# Patient Record
Sex: Female | Born: 1950 | Race: White | Hispanic: No | Marital: Married | Smoking: Former smoker
Health system: Southern US, Community
[De-identification: ages and names within clinical notes are randomized; demographics above are authoritative.]

## PROBLEM LIST (undated history)

## (undated) DIAGNOSIS — E119 Type 2 diabetes mellitus without complications: Secondary | ICD-10-CM

## (undated) DIAGNOSIS — I10 Essential (primary) hypertension: Secondary | ICD-10-CM

## (undated) DIAGNOSIS — N189 Chronic kidney disease, unspecified: Secondary | ICD-10-CM

## (undated) DIAGNOSIS — E079 Disorder of thyroid, unspecified: Secondary | ICD-10-CM

## (undated) HISTORY — DX: Disorder of thyroid, unspecified: E07.9

## (undated) HISTORY — PX: BACK SURGERY: SHX140

## (undated) HISTORY — PX: TUBAL LIGATION: SHX77

## (undated) HISTORY — DX: Chronic kidney disease, unspecified: N18.9

## (undated) HISTORY — DX: Type 2 diabetes mellitus without complications: E11.9

## (undated) HISTORY — PX: WRIST SURGERY: SHX841

## (undated) HISTORY — DX: Essential (primary) hypertension: I10

## (undated) HISTORY — PX: EYE SURGERY: SHX253

---

## 2007-04-14 ENCOUNTER — Ambulatory Visit (HOSPITAL_COMMUNITY): Admission: RE | Admit: 2007-04-14 | Discharge: 2007-04-14 | Payer: Self-pay | Admitting: Endocrinology

## 2007-04-14 ENCOUNTER — Encounter (INDEPENDENT_AMBULATORY_CARE_PROVIDER_SITE_OTHER): Payer: Self-pay | Admitting: Endocrinology

## 2007-04-14 ENCOUNTER — Ambulatory Visit: Payer: Self-pay | Admitting: Surgery

## 2012-06-24 DIAGNOSIS — H543 Unqualified visual loss, both eyes: Secondary | ICD-10-CM | POA: Insufficient documentation

## 2012-06-24 DIAGNOSIS — IMO0001 Reserved for inherently not codable concepts without codable children: Secondary | ICD-10-CM | POA: Insufficient documentation

## 2012-06-24 DIAGNOSIS — I739 Peripheral vascular disease, unspecified: Secondary | ICD-10-CM | POA: Insufficient documentation

## 2012-06-24 DIAGNOSIS — I340 Nonrheumatic mitral (valve) insufficiency: Secondary | ICD-10-CM | POA: Insufficient documentation

## 2012-06-24 DIAGNOSIS — K219 Gastro-esophageal reflux disease without esophagitis: Secondary | ICD-10-CM | POA: Insufficient documentation

## 2012-09-03 DIAGNOSIS — N183 Chronic kidney disease, stage 3 unspecified: Secondary | ICD-10-CM | POA: Insufficient documentation

## 2013-05-09 ENCOUNTER — Encounter: Payer: Medicare Other | Attending: Internal Medicine | Admitting: Nutrition

## 2013-05-09 DIAGNOSIS — E109 Type 1 diabetes mellitus without complications: Secondary | ICD-10-CM | POA: Insufficient documentation

## 2013-05-09 DIAGNOSIS — Z713 Dietary counseling and surveillance: Secondary | ICD-10-CM | POA: Insufficient documentation

## 2013-05-09 DIAGNOSIS — E1065 Type 1 diabetes mellitus with hyperglycemia: Secondary | ICD-10-CM

## 2013-05-10 ENCOUNTER — Encounter: Payer: Self-pay | Admitting: Nutrition

## 2013-05-10 NOTE — Progress Notes (Deleted)
Subjective:     Patient ID: Ariana Wilkinson, female   DOB: 1950-11-27, 62 y.o.   MRN: [id number]  HPI   Review of Systems     Objective:   Physical Exam     Assessment:     ***    Plan:     ***

## 2013-05-10 NOTE — Patient Instructions (Addendum)
Do not give any insulin "to prevent the blood sugar from rising", unless you are eating a meal.   Take 2u of insulin for every carb choice she eats. Take one unit of insulin for every 50 points over 150 as a correction dose. Call Bev Paddock in one week with blood sugar readings.   Call before then if blood sugars still dropping, or going over 250.  ( I will be out of town).

## 2013-05-10 NOTE — Progress Notes (Signed)
Pt is here with husband and is reporting a history of the last 4 months of very high blood sugars despite taking large amounts of insulin.   Says now that her blood sugars are coming down, she is having more low blood sugars.  Meter download shows blood sugar swings.  She is taking large and varied amounts of insulin for high blood sugars, that are causing rebounding lows.  Yesterday AM, FBS was 70 at 7AM.  She took 3u to prevent her blood sugars from going high.  She ate nothing, and blood sugar dropped to 47.?? Blood sugar before lunch was 84, 3hr. PcL: 166, acS: no reading, and HS: 247.  All blood sugars are very variable, and not a clear history of what she is eating and how much insulin she is taking. Pt. Is not counting carbs, but rather just "guessing at what she thinks she needs".  She does this for high blood sugars as well. Per Bev Paddock's instruction, she instructed her on what a "carb serving" is, and we discussed the need to take 2u/each carb serving (15 grams) She was told to not take any additional insulin when not eating, with the pretext "to prevent it from going high", so that we can actually see what her blood sugar is going to do without any boluses.  She agreed to do this.   We also discussed how to give a correction bolus.  She was told to subtract her current blood sugar from 150 (goal), and divide by 50.  She reported good understanding of this, and did 3 examples correctly.  Written instructions were given for all of the above insulin doses.  Also a list of carb choice servings were given to her husband, to review with her.  They both reported good understanding.     I examined her abdomen, and she has some hardened areas that she is inserting her infusion sets into.  She palpated those areas, and reported that she felt the scar tissue.  She was shown new sites on her abdomen and upper buttocks to use.  She agreed to stay away from the scarred areas on her abdomen area.

## 2013-05-18 ENCOUNTER — Encounter: Payer: Medicare Other | Attending: Internal Medicine | Admitting: *Deleted

## 2013-05-18 DIAGNOSIS — Z713 Dietary counseling and surveillance: Secondary | ICD-10-CM | POA: Insufficient documentation

## 2013-05-18 DIAGNOSIS — E109 Type 1 diabetes mellitus without complications: Secondary | ICD-10-CM | POA: Insufficient documentation

## 2013-05-18 NOTE — Patient Instructions (Addendum)
Do not give any insulin "to prevent the blood sugar from rising", unless you are eating a meal.   Take 2u of insulin for every carb choice she eats. Take one unit of insulin for every 50 points over 150 as a correction dose.  Call before then if blood sugars still dropping, or going over 300.  ( I will be out of town).  05/18/13: Please use the Log Sheet I provided and include time, BG, insulin dose and carbs (if able) Also note with a star when sites are changed out If BG is above 300, give Correction Dose with pump and recheck in 2 hours. At that point, if BG has not improved, give Correction Dose with syringe and change out the pump site.  Call Bev too.

## 2013-05-20 ENCOUNTER — Telehealth: Payer: Self-pay | Admitting: *Deleted

## 2013-05-20 NOTE — Telephone Encounter (Signed)
Patient directed to increase Basal Rate from 0.50 units/hour to 0.55 x 24 hours for 2 days. If BG still above 200 mg/dl, increase to 9.60 units/hr on Sunday afternoon (2 days from now) Also to lower Target from 150 to 100 mg/dl Carb Ratio to remain at 2 units per Carb Choice (= 1 u. / 7.5 grams) Sensitivity Factor to remain at 1 u. / 50 mg/dl  I have asked patient to call me on Monday with BG results.

## 2013-05-24 ENCOUNTER — Telehealth: Payer: Self-pay | Admitting: *Deleted

## 2013-05-24 NOTE — Telephone Encounter (Signed)
Per phone call today, patient contacted [employer] over the weekend per my direction regarding a "No Prime" alert that was occuring repeatedly as well as the back light being extremely dim and too difficult to read the screen. She states that they replaced her pump and assisted her husband in programming her pump settings into the new pump over the phone. She states her BGs since switching to new pump have improved markedly with reported range now between 84 and 187 mg/dl for past 24 hours!   She has continued with settings of last Friday: Basal Rate is now @ 0.60 u/hr x 24 hours Carb Ratio: 2 u / Carb choice Sensitivity: 1 u / 50 mg/dl above new target of 161.  Confirmed appointment with me next week - 06/01/13 and [facility] Manager will join [place] to demonstrate 30 degree infusion set.

## 2013-05-26 ENCOUNTER — Telehealth: Payer: Self-pay | Admitting: *Deleted

## 2013-05-26 NOTE — Telephone Encounter (Signed)
Patient called to inform me of repeated hypoglycemia, since 05/24/13, when she replaced old pump with replacement pump. Reported BGs include 8 readings below 70 mg/dl in past 48 hours. Also, when she corrected a high BG of 257 @ 5 AM this morning, she dropped down to 27 mg/dl by 8 AM, so correction dose is too strong too. Plan:  They are to reduce Basal Rate from 0.60 to 0.50 units per hour. I did not have time to walk her husband through the steps over the phone so they agree to call [facility] for directions.  Instructed patient to increase Target from 100 to 150 so that will reduce her correction doses by 1 unit (rather than changing the Sensitivity Factor at this time)  Patient instructed to call me with any BGs below 60 in the future so pump adjustments can be made quickly.  We have an appointment next Wednesday, October 22 with [facility] Manager for further follow up.  Overall she is doing much better with newer pump. Less variability with her BG's overall. She appears to be following directions very well with Carb Ratio and Correction Boluses.

## 2013-06-01 ENCOUNTER — Encounter: Payer: Medicare Other | Admitting: *Deleted

## 2013-06-01 DIAGNOSIS — E1065 Type 1 diabetes mellitus with hyperglycemia: Secondary | ICD-10-CM

## 2013-06-01 NOTE — Patient Instructions (Signed)
06/01/13: Plan: Continue to use Log Sheet and note with a star when sites are changed out  Adjust Carb Ratio down from 2 units/Carb Choice to 1 unit/Carb Choice Adjust Correction Factor from 1/50 points down to 1/100 points Continue with Target of 150 for now. Ask Alinda Money to check your Insulin on Board under the Status Screen of pump before giving a Correction after a meal or as needed.

## 2013-06-06 NOTE — Progress Notes (Signed)
  Pump Follow Up Progress Note  Orders received from MD giving me permission to make insulin pump adjustments for the following patient.  This patient is visually impaired, blind, and operates [employer] Pump with aid of separate ring tones for specific alarm types. She is here with her husband, Alinda Money, to meet with Cristal Deer, [facility] Clinical Manager and myself to evaluate her progress. She has received a new pump from [place] when her previous one was showing signs of wear and the screen was so dim it was difficult to read. Since she received the new pump her BGs have been much lower, with frequent hypoglycemia. Plan to adjust settings as needed today. Alinda Money has completed the BG Log Sheet as requested including food and insulin dose information most of the time. He also completes her Log Book with BG readings.  Vernona Rieger also reviewed data in the pump which revealed she is bolusing small amounts very frequently during the day and night based on bites of food eaten.  Reviewed blood glucose logs on 06/01/13 via: Log Sheets prepared by patient's husband, Alinda Money, and found the following:            Hypoglycemia Hyperglycemia Comments  Overnight Period:  YES  BASAL  Pre-Meal:    Breakfast YES     Lunch YES     Supper YES    Post-Meal: Breakfast   ICR   Lunch YES  ICR   Supper YES  ICR  Bedtime:    BASAL   Comments: After much discussion and evaluation, we decided to decrease her Carb Ratio in half, decrease her Correction Factor in half and leave her Basal Rates alone for now due to her excessive bolusing habits. We also kept Target BG at 150 mg/dl. Asked her not to Bolus for any foods less than 10 grams of carbohydrate going forward.  Pump Settings: Date: Current Date: 06/01/13  Changes in [place] Print   Basal Rate: Carb Ratio Sensitivity  Basal Rate: Carb Ratio Sensitivity   MN: 0.50 2/choice 1/50 MN: 0.50 1/choice 1/100                                                         Plan: Patient was  able to repeat changes successfully and demonstrate with several potential BG and carb examples. She was instructed to call me with any BGs below 60 or above 300 for immediate attention.  Follow up: Patient to follow up with either myself or Cristy Folks, RN, CDE as she returns from her vacation the first of November.

## 2013-06-21 ENCOUNTER — Telehealth: Payer: Self-pay | Admitting: *Deleted

## 2013-06-22 NOTE — Telephone Encounter (Signed)
Patient's husband provided me with BG data for past 3 days. Several incidences of hypoglycemia during the night and in the afternoon reported as well as need for Glucagon injection one night. Asked patient to decrease Basal Rate down from .030 to 0.25 units per hour x 24 hours. She has appointment with Dr. Sharl Ma on December 12th.

## 2013-09-19 NOTE — Progress Notes (Signed)
05/18/13: Please use the Log Sheet I provided and include time, BG, insulin dose and carbs (if able) Also note with a star when sites are changed out If BG is above 300, give Correction Dose with pump and recheck in 2 hours. At that point, if BG has not improved, give Correction Dose with syringe and change out the pump site.  Call Bev too.

## 2016-03-11 ENCOUNTER — Ambulatory Visit
Admission: RE | Admit: 2016-03-11 | Discharge: 2016-03-11 | Disposition: A | Discharge status: Home / Self Care | Payer: Medicare Other | Source: Ambulatory Visit | Attending: Internal Medicine | Admitting: Internal Medicine

## 2016-03-11 ENCOUNTER — Other Ambulatory Visit: Payer: Self-pay | Admitting: Internal Medicine

## 2016-03-11 DIAGNOSIS — M79671 Pain in right foot: Secondary | ICD-10-CM

## 2017-02-12 DIAGNOSIS — M069 Rheumatoid arthritis, unspecified: Secondary | ICD-10-CM | POA: Insufficient documentation

## 2017-06-12 ENCOUNTER — Other Ambulatory Visit: Payer: Self-pay | Admitting: Obstetrics & Gynecology

## 2017-06-12 DIAGNOSIS — R928 Other abnormal and inconclusive findings on diagnostic imaging of breast: Secondary | ICD-10-CM

## 2017-06-25 ENCOUNTER — Other Ambulatory Visit: Payer: Self-pay | Admitting: Obstetrics & Gynecology

## 2017-06-25 ENCOUNTER — Ambulatory Visit
Admission: RE | Admit: 2017-06-25 | Discharge: 2017-06-25 | Disposition: A | Discharge status: Home / Self Care | Payer: Medicare Other | Source: Ambulatory Visit | Attending: Obstetrics & Gynecology | Admitting: Obstetrics & Gynecology

## 2017-06-25 DIAGNOSIS — R599 Enlarged lymph nodes, unspecified: Secondary | ICD-10-CM

## 2017-06-25 DIAGNOSIS — R928 Other abnormal and inconclusive findings on diagnostic imaging of breast: Secondary | ICD-10-CM

## 2017-06-26 ENCOUNTER — Other Ambulatory Visit: Payer: Self-pay | Admitting: Obstetrics & Gynecology

## 2017-06-26 DIAGNOSIS — R599 Enlarged lymph nodes, unspecified: Secondary | ICD-10-CM

## 2017-06-29 ENCOUNTER — Other Ambulatory Visit: Payer: Self-pay | Admitting: Diagnostic Radiology

## 2017-06-29 ENCOUNTER — Ambulatory Visit
Admission: RE | Admit: 2017-06-29 | Discharge: 2017-06-29 | Disposition: A | Discharge status: Home / Self Care | Payer: Medicare Other | Source: Ambulatory Visit | Attending: Obstetrics & Gynecology | Admitting: Obstetrics & Gynecology

## 2017-06-29 DIAGNOSIS — R599 Enlarged lymph nodes, unspecified: Secondary | ICD-10-CM

## 2017-07-17 ENCOUNTER — Other Ambulatory Visit: Payer: Self-pay | Admitting: Specialist

## 2017-07-17 DIAGNOSIS — R918 Other nonspecific abnormal finding of lung field: Secondary | ICD-10-CM

## 2017-10-05 DIAGNOSIS — M810 Age-related osteoporosis without current pathological fracture: Secondary | ICD-10-CM | POA: Insufficient documentation

## 2017-11-18 ENCOUNTER — Telehealth: Payer: Self-pay | Admitting: Nutrition

## 2017-11-18 NOTE — Telephone Encounter (Signed)
Discussed coming in for an appt.  She would prefer that the pump representatives come to her house to show her the pumps.  I told her that I will call them to see if they still do this, and let her know.  Since only [employer] and [employer] have audio boluses, those representatives were called and messages were left on their machines.

## 2017-11-25 ENCOUNTER — Telehealth: Payer: Self-pay | Admitting: Nutrition

## 2017-11-25 NOTE — Telephone Encounter (Signed)
I have heard back via Email from the [employer] rep., who visited her at her home on 4/16.  I have not heard back from [employer] representative as yet.

## 2018-08-30 ENCOUNTER — Other Ambulatory Visit: Payer: Self-pay | Admitting: Internal Medicine

## 2018-08-30 DIAGNOSIS — Z1231 Encounter for screening mammogram for malignant neoplasm of breast: Secondary | ICD-10-CM

## 2018-09-24 ENCOUNTER — Ambulatory Visit
Admission: RE | Admit: 2018-09-24 | Discharge: 2018-09-24 | Disposition: A | Discharge status: Home / Self Care | Payer: Medicare Other | Source: Ambulatory Visit | Attending: Internal Medicine | Admitting: Internal Medicine

## 2018-09-24 DIAGNOSIS — Z1231 Encounter for screening mammogram for malignant neoplasm of breast: Secondary | ICD-10-CM

## 2019-04-26 ENCOUNTER — Other Ambulatory Visit: Payer: Self-pay | Admitting: Internal Medicine

## 2019-04-26 DIAGNOSIS — M810 Age-related osteoporosis without current pathological fracture: Secondary | ICD-10-CM

## 2019-09-05 ENCOUNTER — Encounter (HOSPITAL_COMMUNITY): Payer: Medicare Other

## 2019-09-06 ENCOUNTER — Other Ambulatory Visit: Payer: Self-pay

## 2019-09-06 ENCOUNTER — Ambulatory Visit (HOSPITAL_COMMUNITY)
Admission: RE | Admit: 2019-09-06 | Discharge: 2019-09-06 | Disposition: A | Discharge status: Home / Self Care | Payer: Medicare Other | Source: Ambulatory Visit | Attending: Internal Medicine | Admitting: Internal Medicine

## 2019-09-06 DIAGNOSIS — M810 Age-related osteoporosis without current pathological fracture: Secondary | ICD-10-CM | POA: Diagnosis not present

## 2019-09-06 LAB — GLUCOSE, CAPILLARY: Glucose-Capillary: 84 mg/dL (ref 70–99)

## 2019-09-06 MED ORDER — ZOLEDRONIC ACID 5 MG/100ML IV SOLN: 5.0000 mg | Freq: Once | INTRAVENOUS | Status: AC

## 2019-09-06 MED ORDER — ZOLEDRONIC ACID 5 MG/100ML IV SOLN
INTRAVENOUS | Status: AC
  Administered 2019-09-06: 5 mg via INTRAVENOUS
  Filled 2019-09-06: qty 100

## 2020-01-23 ENCOUNTER — Other Ambulatory Visit: Payer: Self-pay | Admitting: Internal Medicine

## 2020-01-23 DIAGNOSIS — Z1231 Encounter for screening mammogram for malignant neoplasm of breast: Secondary | ICD-10-CM

## 2020-02-03 ENCOUNTER — Other Ambulatory Visit: Payer: Self-pay

## 2020-02-03 ENCOUNTER — Ambulatory Visit
Admission: RE | Admit: 2020-02-03 | Discharge: 2020-02-03 | Disposition: A | Discharge status: Home / Self Care | Payer: Medicare Other | Source: Ambulatory Visit | Attending: Internal Medicine | Admitting: Internal Medicine

## 2020-02-03 DIAGNOSIS — Z1231 Encounter for screening mammogram for malignant neoplasm of breast: Secondary | ICD-10-CM

## 2020-09-24 ENCOUNTER — Ambulatory Visit
Admission: RE | Admit: 2020-09-24 | Discharge: 2020-09-24 | Disposition: A | Discharge status: Home / Self Care | Payer: Medicare Other | Source: Ambulatory Visit | Attending: Internal Medicine | Admitting: Internal Medicine

## 2020-09-24 ENCOUNTER — Other Ambulatory Visit: Payer: Self-pay | Admitting: Internal Medicine

## 2020-09-24 DIAGNOSIS — M79652 Pain in left thigh: Secondary | ICD-10-CM

## 2020-09-24 DIAGNOSIS — M25572 Pain in left ankle and joints of left foot: Secondary | ICD-10-CM

## 2020-11-26 DIAGNOSIS — Z01818 Encounter for other preprocedural examination: Secondary | ICD-10-CM

## 2021-01-24 ENCOUNTER — Inpatient Hospital Stay (HOSPITAL_COMMUNITY)
Admission: AD | Admit: 2021-01-24 | Discharge: 2021-02-08 | DRG: 682 | Disposition: A | Discharge status: Hospice | Payer: Medicare Other | Source: Other Acute Inpatient Hospital | Attending: Internal Medicine | Admitting: Internal Medicine

## 2021-01-24 DIAGNOSIS — N3289 Other specified disorders of bladder: Secondary | ICD-10-CM | POA: Diagnosis present

## 2021-01-24 DIAGNOSIS — I5030 Unspecified diastolic (congestive) heart failure: Secondary | ICD-10-CM | POA: Diagnosis present

## 2021-01-24 DIAGNOSIS — Z888 Allergy status to other drugs, medicaments and biological substances status: Secondary | ICD-10-CM

## 2021-01-24 DIAGNOSIS — N1832 Chronic kidney disease, stage 3b: Secondary | ICD-10-CM | POA: Diagnosis present

## 2021-01-24 DIAGNOSIS — M47816 Spondylosis without myelopathy or radiculopathy, lumbar region: Secondary | ICD-10-CM | POA: Diagnosis present

## 2021-01-24 DIAGNOSIS — N179 Acute kidney failure, unspecified: Principal | ICD-10-CM

## 2021-01-24 DIAGNOSIS — Z9641 Presence of insulin pump (external) (internal): Secondary | ICD-10-CM | POA: Diagnosis present

## 2021-01-24 DIAGNOSIS — E1022 Type 1 diabetes mellitus with diabetic chronic kidney disease: Secondary | ICD-10-CM | POA: Diagnosis present

## 2021-01-24 DIAGNOSIS — E872 Acidosis, unspecified: Secondary | ICD-10-CM | POA: Diagnosis present

## 2021-01-24 DIAGNOSIS — E10649 Type 1 diabetes mellitus with hypoglycemia without coma: Secondary | ICD-10-CM | POA: Diagnosis present

## 2021-01-24 DIAGNOSIS — I10 Essential (primary) hypertension: Secondary | ICD-10-CM | POA: Diagnosis present

## 2021-01-24 DIAGNOSIS — R338 Other retention of urine: Secondary | ICD-10-CM | POA: Diagnosis present

## 2021-01-24 DIAGNOSIS — E663 Overweight: Secondary | ICD-10-CM | POA: Diagnosis present

## 2021-01-24 DIAGNOSIS — E871 Hypo-osmolality and hyponatremia: Secondary | ICD-10-CM | POA: Diagnosis present

## 2021-01-24 DIAGNOSIS — G9341 Metabolic encephalopathy: Secondary | ICD-10-CM | POA: Diagnosis present

## 2021-01-24 DIAGNOSIS — Z452 Encounter for adjustment and management of vascular access device: Secondary | ICD-10-CM

## 2021-01-24 DIAGNOSIS — K567 Ileus, unspecified: Secondary | ICD-10-CM

## 2021-01-24 DIAGNOSIS — H547 Unspecified visual loss: Secondary | ICD-10-CM | POA: Diagnosis present

## 2021-01-24 DIAGNOSIS — E1143 Type 2 diabetes mellitus with diabetic autonomic (poly)neuropathy: Secondary | ICD-10-CM | POA: Diagnosis present

## 2021-01-24 DIAGNOSIS — N390 Urinary tract infection, site not specified: Secondary | ICD-10-CM | POA: Diagnosis present

## 2021-01-24 DIAGNOSIS — R0603 Acute respiratory distress: Secondary | ICD-10-CM

## 2021-01-24 DIAGNOSIS — E46 Unspecified protein-calorie malnutrition: Secondary | ICD-10-CM | POA: Diagnosis present

## 2021-01-24 DIAGNOSIS — D638 Anemia in other chronic diseases classified elsewhere: Secondary | ICD-10-CM | POA: Diagnosis present

## 2021-01-24 DIAGNOSIS — J189 Pneumonia, unspecified organism: Secondary | ICD-10-CM

## 2021-01-24 DIAGNOSIS — E039 Hypothyroidism, unspecified: Secondary | ICD-10-CM | POA: Diagnosis present

## 2021-01-24 DIAGNOSIS — Z66 Do not resuscitate: Secondary | ICD-10-CM | POA: Diagnosis not present

## 2021-01-24 DIAGNOSIS — I313 Pericardial effusion (noninflammatory): Secondary | ICD-10-CM | POA: Diagnosis present

## 2021-01-24 DIAGNOSIS — Z794 Long term (current) use of insulin: Secondary | ICD-10-CM

## 2021-01-24 DIAGNOSIS — Z515 Encounter for palliative care: Secondary | ICD-10-CM

## 2021-01-24 DIAGNOSIS — E8809 Other disorders of plasma-protein metabolism, not elsewhere classified: Secondary | ICD-10-CM | POA: Diagnosis present

## 2021-01-24 DIAGNOSIS — R0602 Shortness of breath: Secondary | ICD-10-CM

## 2021-01-24 DIAGNOSIS — Z5329 Procedure and treatment not carried out because of patient's decision for other reasons: Secondary | ICD-10-CM | POA: Diagnosis not present

## 2021-01-24 DIAGNOSIS — Z833 Family history of diabetes mellitus: Secondary | ICD-10-CM

## 2021-01-24 DIAGNOSIS — Z79899 Other long term (current) drug therapy: Secondary | ICD-10-CM

## 2021-01-24 DIAGNOSIS — J9601 Acute respiratory failure with hypoxia: Secondary | ICD-10-CM | POA: Diagnosis not present

## 2021-01-24 DIAGNOSIS — E876 Hypokalemia: Secondary | ICD-10-CM | POA: Diagnosis present

## 2021-01-24 DIAGNOSIS — E1043 Type 1 diabetes mellitus with diabetic autonomic (poly)neuropathy: Secondary | ICD-10-CM | POA: Diagnosis present

## 2021-01-24 DIAGNOSIS — Z6825 Body mass index (BMI) 25.0-25.9, adult: Secondary | ICD-10-CM

## 2021-01-24 DIAGNOSIS — Z79890 Hormone replacement therapy (postmenopausal): Secondary | ICD-10-CM

## 2021-01-24 DIAGNOSIS — A498 Other bacterial infections of unspecified site: Secondary | ICD-10-CM

## 2021-01-24 DIAGNOSIS — R410 Disorientation, unspecified: Secondary | ICD-10-CM | POA: Diagnosis present

## 2021-01-24 DIAGNOSIS — Z7189 Other specified counseling: Secondary | ICD-10-CM | POA: Diagnosis not present

## 2021-01-24 DIAGNOSIS — I371 Nonrheumatic pulmonary valve insufficiency: Secondary | ICD-10-CM | POA: Diagnosis present

## 2021-01-24 DIAGNOSIS — I130 Hypertensive heart and chronic kidney disease with heart failure and stage 1 through stage 4 chronic kidney disease, or unspecified chronic kidney disease: Secondary | ICD-10-CM | POA: Diagnosis present

## 2021-01-24 DIAGNOSIS — H9190 Unspecified hearing loss, unspecified ear: Secondary | ICD-10-CM | POA: Diagnosis present

## 2021-01-24 DIAGNOSIS — Z981 Arthrodesis status: Secondary | ICD-10-CM | POA: Diagnosis not present

## 2021-01-24 DIAGNOSIS — E1065 Type 1 diabetes mellitus with hyperglycemia: Secondary | ICD-10-CM | POA: Diagnosis not present

## 2021-01-24 MED ORDER — LINEZOLID 600 MG/300ML IV SOLN
600.0000 mg | Freq: Two times a day (BID) | INTRAVENOUS | Status: DC
  Administered 2021-01-25 – 2021-01-28 (×8): 600 mg via INTRAVENOUS
  Filled 2021-01-24 (×9): qty 300

## 2021-01-24 MED ORDER — INSULIN ASPART 100 UNIT/ML IJ SOLN
0.0000 [IU] | INTRAMUSCULAR | Status: DC
  Administered 2021-01-25: 2 [IU] via SUBCUTANEOUS
  Administered 2021-01-25: 5 [IU] via SUBCUTANEOUS
  Administered 2021-01-25 (×3): 2 [IU] via SUBCUTANEOUS
  Administered 2021-01-25: 3 [IU] via SUBCUTANEOUS

## 2021-01-24 MED ORDER — ACETAMINOPHEN 325 MG PO TABS: 650.0000 mg | ORAL_TABLET | Freq: Four times a day (QID) | ORAL | Status: DC | PRN

## 2021-01-24 MED ORDER — METOPROLOL TARTRATE 5 MG/5ML IV SOLN: 5.0000 mg | Freq: Four times a day (QID) | INTRAVENOUS | Status: DC | PRN

## 2021-01-24 MED ORDER — METOPROLOL TARTRATE 5 MG/5ML IV SOLN: 2.5000 mg | Freq: Four times a day (QID) | INTRAVENOUS | Status: DC | PRN

## 2021-01-24 MED ORDER — OXYCODONE HCL 5 MG PO TABS: 5.0000 mg | ORAL_TABLET | Freq: Four times a day (QID) | ORAL | Status: DC | PRN

## 2021-01-24 MED ORDER — HEPARIN SODIUM (PORCINE) 5000 UNIT/ML IJ SOLN
5000.0000 [IU] | Freq: Three times a day (TID) | INTRAMUSCULAR | Status: DC
  Administered 2021-01-25 – 2021-02-03 (×30): 5000 [IU] via SUBCUTANEOUS
  Filled 2021-01-24 (×29): qty 1

## 2021-01-24 MED ORDER — SODIUM BICARBONATE 8.4 % IV SOLN
INTRAVENOUS | Status: AC
  Filled 2021-01-24 (×3): qty 1000

## 2021-01-24 MED ORDER — ONDANSETRON HCL 4 MG/2ML IJ SOLN
4.0000 mg | Freq: Four times a day (QID) | INTRAMUSCULAR | Status: DC | PRN
  Administered 2021-01-27 – 2021-02-02 (×10): 4 mg via INTRAVENOUS
  Filled 2021-01-24 (×10): qty 2

## 2021-01-24 MED ORDER — HYDROMORPHONE HCL 1 MG/ML IJ SOLN: 0.5000 mg | INTRAMUSCULAR | Status: DC | PRN

## 2021-01-24 MED ORDER — SENNOSIDES-DOCUSATE SODIUM 8.6-50 MG PO TABS: 1.0000 | ORAL_TABLET | Freq: Every evening | ORAL | Status: DC | PRN

## 2021-01-24 MED ORDER — INSULIN DETEMIR 100 UNIT/ML ~~LOC~~ SOLN
5.0000 [IU] | Freq: Every day | SUBCUTANEOUS | Status: DC
  Administered 2021-01-25 (×2): 5 [IU] via SUBCUTANEOUS
  Filled 2021-01-24 (×2): qty 0.05

## 2021-01-24 NOTE — H&P (Addendum)
History and Physical  [name]:[id number] DOB: 14-Apr-1951 DOA: 01/24/2021  Referring physician: Direct admission from [facility] health ED to [facility] 77M, accepted by Dr. Blake Divine, [name]. PCP: [name], MD  Outpatient Specialists: Orthopedic surgery Patient coming from: Home.    Chief Complaint: Lower back pain, confusion.  HPI: Ariana Wilkinson is a 70 y.o. female with medical history significant for critical blindness, type 1 diabetes on insulin pump, status post lumbar fusion surgery at L5-S1 on 12/26/2020 by Dr. [name], orthopedic surgery, readmitted for post infection/abscess with washout surgery on 01/11/2021, infection site growing Pseudomonas sensitive to cefepime, discharged on 01/16/2021 to complete 4 weeks of IV antibiotics, she presents to [facility] on 01/23/2021 for acute confusion of less than a day duration and AKI.  Creatinine elevated 4.2 up from a baseline of 1.0, BUN of 53 up from 12.  CT abdomen pelvis showing post renal obstruction from distended bladder and chronic aorto iliac, mesenteric artery calcifications.  Foley catheter was placed on 01/24/2021.  Patient was accepted as a direct admission from [facility] health ED to [facility] 77M unit by Dr. Blake Divine, [facility].  Majority of the history is obtained from patient's husband via phone and Dr. Blake Divine admission progress note.  At the time of this visit the patient is obtunded and not able to provide a history.  She had received a dose of IV Haldol in the ED at [facility] Per review of medical record on paper chart.  ED Course:  Temperature 98.6, BP 195/77, pulse 93, respiratory 20, O2 saturation 94% on room air.  Lab studies remarkable for serum sodium 133, potassium 4.3, chloride 105, serum bicarb 15, BUN 53, creatinine 4.20 with GFR of 10, serum glucose 76.  Magnesium 1.9.  proBNP 3600, WBC 13.2, hemoglobin 10.5, creatinine 32.4.  Platelet count 311.  Troponin less than 0.01.  VBG pH 7.22, PCO2 42.  Lactic acid 1.3.   Procalcitonin 4.0.  While in the ED patient received IV cefepime, isotonic bicarb, linezolid.  COVID-19 screening test negative, influenza AMB negative.  Foley catheter insertion on 01/24/2021 at [facility] health ED.  EKG showing normal sinus rhythm rate of 88, nonspecific ST-T changes.  QTc 404.  Review of Systems: Review of systems as noted in the HPI. All other systems reviewed and are negative.   Past Medical History:  Diagnosis Date   Diabetes mellitus without complication (HCC)    Past Surgical History:  Procedure Laterality Date   EYE SURGERY      Social History:  has no history on file for tobacco use, alcohol use, and drug use.   Allergies  Allergen Reactions   Macrodantin [Nitrofurantoin] Shortness Of Breath    Family history: Brother, sister, mother with history of diabetes Mother and father are both deceased.   Prior to Admission medications   Medication Sig Start Date End Date Taking? Authorizing Provider  amLODipine (NORVASC) 5 MG tablet Take 5 mg by mouth daily.    [provider]  atorvastatin (LIPITOR) 80 MG tablet Take 80 mg by mouth daily.    [provider]  carvedilol (COREG) 12.5 MG tablet Take 12.5 mg by mouth 2 (two) times daily with a meal.    [provider]  furosemide (LASIX) 20 MG tablet Take 40 mg by mouth as needed.    [provider]  insulin lispro (HUMALOG) 100 UNIT/ML injection Inject into the skin continuous.    [provider]  levothyroxine (SYNTHROID) 75 MCG tablet Take  75 mcg by mouth daily before breakfast.    [provider]  omeprazole (PRILOSEC) 20 MG capsule Take 20 mg by mouth as needed.    [provider]  sulfaSALAzine (AZULFIDINE) 500 MG tablet Take 1,000 mg by mouth 2 (two) times daily.    [provider]    Physical Exam: BP (!) 141/42 (BP Location: Right Arm)   Pulse 91   Temp 97.8 F (36.6 C) (Oral)   Resp 18   SpO2 96%   General: 70 y.o. year-old  female well developed well nourished in no acute distress.  Minimally interactive, obtunded. Cardiovascular: Regular rate and rhythm with no rubs or gallops.  No thyromegaly or JVD noted.  No lower extremity edema. 2/4 pulses in all 4 extremities. Respiratory: Clear to auscultation with no wheezes or rales. Good inspiratory effort. Abdomen: Soft nontender nondistended with normal bowel sounds x4 quadrants. Muskuloskeletal: No cyanosis, clubbing or edema noted bilaterally Neuro: CN II-XII intact, strength, sensation, reflexes Skin: No ulcerative lesions noted or rashes Psychiatry: Judgement and insight appear altered.  Unable to assess mood due to obtundation.         Labs on Admission:  Basic Metabolic Panel: No results for input(s): NA, K, CL, CO2, GLUCOSE, BUN, CREATININE, CALCIUM, MG, PHOS in the last 168 hours. Liver Function Tests: No results for input(s): AST, ALT, ALKPHOS, BILITOT, PROT, ALBUMIN in the last 168 hours. No results for input(s): LIPASE, AMYLASE in the last 168 hours. No results for input(s): AMMONIA in the last 168 hours. CBC: No results for input(s): WBC, NEUTROABS, HGB, HCT, MCV, PLT in the last 168 hours. Cardiac Enzymes: No results for input(s): CKTOTAL, CKMB, CKMBINDEX, TROPONINI in the last 168 hours.  BNP (last 3 results) No results for input(s): BNP in the last 8760 hours.  ProBNP (last 3 results) No results for input(s): PROBNP in the last 8760 hours.  CBG: No results for input(s): GLUCAP in the last 168 hours.  Radiological Exams on Admission: No results found.  EKG: I independently viewed the EKG done and my findings are as followed: Sinus rhythm rate of 88.  Nonspecific ST-T changes.  QTc 404.  Assessment/Plan Present on Admission:  AKI (acute kidney injury) (HCC)  Active Problems:   AKI (acute kidney injury) (HCC)  AKI, suspect multifactorial secondary to prerenal from poor oral intake, post renal from urinary retention. Baseline  creatinine appears to be 1.0 with GFR greater than 60 She presented with creatinine of 4.20 with GFR of 10. Continue IV fluid hydration bicarb drip with dextrose at 75 cc/h. Closely monitor urine output Avoid nephrotoxic agent, dehydration and hypotension. Renal panel daily. Consult nephrology in the morning.  Non anion gap metabolic acidosis in the setting of AKI. Presented with serum bicarb of 15 with VBG pH of 7.22 Continue bicarb drip Repeat renal panel in the morning  Acute metabolic encephalopathy, likely secondary to acute illness Per husband via phone confusion started acutely the night prior to her presentation to the ED on 01/23/2021. At the time of this exam the patient is obtunded after receiving a dose of IV Haldol Reorient as needed N.p.o. until more alert Aspiration/fall/delirium precautions. Avoid sedating agents One-to-one sitter for patient's own safety Continue to treat underlying conditions  Type 1 diabetes with hypoglycemia On insulin pump Continue isotonic bicarb dextrose while n.p.o. Continue insulin coverage while n.p.o. to avoid DKA 1 Diabetes coordinator consult  Status post lumbar fusion surgery at L5-S1 on 12/26/2020, postop infection/abscess with washout surgery on 01/11/2021  Infection site growing Pseudomonas sensitive to cefepime, continue Continue linezolid Obtain MRSA screening She was discharged on 01/16/2021 to complete 4 weeks of IV antibiotics Obtain blood cultures and follow Consult infectious disease in the morning. Per husband she was scheduled to follow-up with infectious disease outpatient on 02/05/2021.  Acute urinary retention Bladder distention post Foley catheter insertion on 01/24/21 in the ED at [facility] Will need voiding trial once able to ambulate and mentation has improved.  Essential hypertension BP not at goal, elevated IV Lopressor PRN with parameters while NPO Resume home oral antihypertensives when no longer  NPO Monitor vital signs closely  Hypothyroidism Hold off home synthroid and switch to IV levothyroxine since NPO  Chronic aorto iliac, mesenteric artery calcifications Resume home Lipitor when no longer NPO    DVT prophylaxis: Heparin subcu 3 times daily.  Code Status: Full code.  Family Communication: None at bedside.  Updated her husband via phone who requests to be updated daily.  Disposition Plan: Admitted to telemetry medical  Consults called: Please consult nephrology and infectious disease in the morning.  Admission status: Inpatient status.  Patient will require at least 2 midnights for further evaluation and treatment of present conditions.   Status is: Inpatient    Dispo: The patient is from: Home through [facility] health ED.  Admitted as a direct admission by Dr. Blake Divine, [facility].                Anticipated d/c is to: Home possibly on 01/26/2021 once mentation has improved, nephrology and infectious disease have signed off.                Patient currently not stable for discharge.     Difficult to place patient, not applicable.         Darlin Drop MD [facility] Pager [phone]  If 7PM-7AM, please contact night-coverage [email or web address] Password [email or web address]  01/24/2021, 10:59 PM

## 2021-01-25 ENCOUNTER — Inpatient Hospital Stay (HOSPITAL_COMMUNITY): Payer: Medicare Other

## 2021-01-25 ENCOUNTER — Encounter (HOSPITAL_COMMUNITY): Payer: Self-pay | Admitting: Internal Medicine

## 2021-01-25 DIAGNOSIS — E872 Acidosis, unspecified: Secondary | ICD-10-CM

## 2021-01-25 DIAGNOSIS — E1065 Type 1 diabetes mellitus with hyperglycemia: Secondary | ICD-10-CM

## 2021-01-25 DIAGNOSIS — G9341 Metabolic encephalopathy: Secondary | ICD-10-CM | POA: Diagnosis present

## 2021-01-25 DIAGNOSIS — R338 Other retention of urine: Secondary | ICD-10-CM

## 2021-01-25 DIAGNOSIS — I10 Essential (primary) hypertension: Secondary | ICD-10-CM

## 2021-01-25 HISTORY — DX: Acidosis, unspecified: E87.20

## 2021-01-25 LAB — URINALYSIS, ROUTINE W REFLEX MICROSCOPIC
Bacteria, UA: NONE SEEN
Bilirubin Urine: NEGATIVE
Glucose, UA: NEGATIVE mg/dL
Ketones, ur: 5 mg/dL — AB
Nitrite: NEGATIVE
Protein, ur: 100 mg/dL — AB
Specific Gravity, Urine: 1.018 (ref 1.005–1.030)
WBC, UA: >50 WBC/hpf — ABNORMAL HIGH (ref 0–5)
pH: 5 (ref 5.0–8.0)

## 2021-01-25 LAB — LIPID PANEL
Cholesterol: 159 mg/dL (ref 0–200)
HDL: 40 mg/dL — ABNORMAL LOW (ref >40)
LDL Cholesterol: 95 mg/dL (ref 0–99)
Total CHOL/HDL Ratio: 4 RATIO
Triglycerides: 118 mg/dL (ref <150)
VLDL: 24 mg/dL (ref 0–40)

## 2021-01-25 LAB — CBC
HCT: 28.5 % — ABNORMAL LOW (ref 36.0–46.0)
Hemoglobin: 9.2 g/dL — ABNORMAL LOW (ref 12.0–15.0)
MCH: 31.9 pg (ref 26.0–34.0)
MCHC: 32.3 g/dL (ref 30.0–36.0)
MCV: 99 fL (ref 80.0–100.0)
Platelets: 293 10*3/uL (ref 150–400)
RBC: 2.88 MIL/uL — ABNORMAL LOW (ref 3.87–5.11)
RDW: 13.9 % (ref 11.5–15.5)
WBC: 9.7 10*3/uL (ref 4.0–10.5)
nRBC: 0 % (ref 0.0–0.2)

## 2021-01-25 LAB — BASIC METABOLIC PANEL
Anion gap: 14 (ref 5–15)
Anion gap: 14 (ref 5–15)
BUN: 51 mg/dL — ABNORMAL HIGH (ref 8–23)
BUN: 53 mg/dL — ABNORMAL HIGH (ref 8–23)
CO2: 17 mmol/L — ABNORMAL LOW (ref 22–32)
CO2: 18 mmol/L — ABNORMAL LOW (ref 22–32)
Calcium: 8.1 mg/dL — ABNORMAL LOW (ref 8.9–10.3)
Calcium: 7.9 mg/dL — ABNORMAL LOW (ref 8.9–10.3)
Chloride: 100 mmol/L (ref 98–111)
Chloride: 100 mmol/L (ref 98–111)
Creatinine, Ser: 4.21 mg/dL — ABNORMAL HIGH (ref 0.44–1.00)
Creatinine, Ser: 4.49 mg/dL — ABNORMAL HIGH (ref 0.44–1.00)
GFR, Estimated: 11 mL/min — ABNORMAL LOW (ref >60)
GFR, Estimated: 10 mL/min — ABNORMAL LOW (ref >60)
Glucose, Bld: 222 mg/dL — ABNORMAL HIGH (ref 70–99)
Glucose, Bld: 258 mg/dL — ABNORMAL HIGH (ref 70–99)
Potassium: 3.3 mmol/L — ABNORMAL LOW (ref 3.5–5.1)
Potassium: 3.3 mmol/L — ABNORMAL LOW (ref 3.5–5.1)
Sodium: 131 mmol/L — ABNORMAL LOW (ref 135–145)
Sodium: 132 mmol/L — ABNORMAL LOW (ref 135–145)

## 2021-01-25 LAB — GLUCOSE, CAPILLARY
Glucose-Capillary: 167 mg/dL — ABNORMAL HIGH (ref 70–99)
Glucose-Capillary: 168 mg/dL — ABNORMAL HIGH (ref 70–99)
Glucose-Capillary: 181 mg/dL — ABNORMAL HIGH (ref 70–99)
Glucose-Capillary: 186 mg/dL — ABNORMAL HIGH (ref 70–99)
Glucose-Capillary: 241 mg/dL — ABNORMAL HIGH (ref 70–99)
Glucose-Capillary: 268 mg/dL — ABNORMAL HIGH (ref 70–99)

## 2021-01-25 LAB — PROCALCITONIN: Procalcitonin: 7.07 ng/mL

## 2021-01-25 LAB — PHOSPHORUS: Phosphorus: 3.6 mg/dL (ref 2.5–4.6)

## 2021-01-25 LAB — TSH: TSH: 33.412 u[IU]/mL — ABNORMAL HIGH (ref 0.350–4.500)

## 2021-01-25 LAB — HIV ANTIBODY (ROUTINE TESTING W REFLEX): HIV Screen 4th Generation wRfx: NONREACTIVE

## 2021-01-25 LAB — MAGNESIUM: Magnesium: 1.7 mg/dL (ref 1.7–2.4)

## 2021-01-25 LAB — AMMONIA: Ammonia: 34 umol/L (ref 9–35)

## 2021-01-25 MED ORDER — CHLORHEXIDINE GLUCONATE CLOTH 2 % EX PADS
6.0000 | MEDICATED_PAD | Freq: Every day | CUTANEOUS | Status: DC
  Administered 2021-01-25 – 2021-02-08 (×14): 6 via TOPICAL

## 2021-01-25 MED ORDER — INSULIN ASPART 100 UNIT/ML IJ SOLN
0.0000 [IU] | INTRAMUSCULAR | Status: DC
  Administered 2021-01-25: 5 [IU] via SUBCUTANEOUS
  Administered 2021-01-26 (×2): 3 [IU] via SUBCUTANEOUS
  Administered 2021-01-26: 5 [IU] via SUBCUTANEOUS
  Administered 2021-01-26: 8 [IU] via SUBCUTANEOUS
  Administered 2021-01-26: 3 [IU] via SUBCUTANEOUS
  Administered 2021-01-27: 8 [IU] via SUBCUTANEOUS
  Administered 2021-01-27: 3 [IU] via SUBCUTANEOUS
  Administered 2021-01-27: 2 [IU] via SUBCUTANEOUS
  Administered 2021-01-27 – 2021-01-28 (×3): 3 [IU] via SUBCUTANEOUS
  Administered 2021-01-28: 8 [IU] via SUBCUTANEOUS
  Administered 2021-01-28 – 2021-01-30 (×7): 3 [IU] via SUBCUTANEOUS
  Administered 2021-01-30: 2 [IU] via SUBCUTANEOUS
  Administered 2021-01-30: 5 [IU] via SUBCUTANEOUS
  Administered 2021-01-31: 3 [IU] via SUBCUTANEOUS
  Administered 2021-01-31: 8 [IU] via SUBCUTANEOUS
  Administered 2021-01-31: 5 [IU] via SUBCUTANEOUS
  Administered 2021-02-01: 3 [IU] via SUBCUTANEOUS
  Administered 2021-02-01 (×2): 2 [IU] via SUBCUTANEOUS
  Administered 2021-02-01: 3 [IU] via SUBCUTANEOUS
  Administered 2021-02-02: 2 [IU] via SUBCUTANEOUS
  Administered 2021-02-02: 11 [IU] via SUBCUTANEOUS
  Administered 2021-02-02: 2 [IU] via SUBCUTANEOUS
  Administered 2021-02-02: 5 [IU] via SUBCUTANEOUS
  Administered 2021-02-02: 2 [IU] via SUBCUTANEOUS
  Administered 2021-02-03: 3 [IU] via SUBCUTANEOUS
  Administered 2021-02-03: 8 [IU] via SUBCUTANEOUS

## 2021-01-25 MED ORDER — SODIUM CHLORIDE 0.9 % IV SOLN
2.0000 g | INTRAVENOUS | Status: DC
  Administered 2021-01-25: 2 g via INTRAVENOUS
  Filled 2021-01-25: qty 2

## 2021-01-25 MED ORDER — LEVOTHYROXINE SODIUM 100 MCG/5ML IV SOLN: 37.5000 ug | Freq: Every day | INTRAVENOUS | Status: DC

## 2021-01-25 MED ORDER — LEVOTHYROXINE SODIUM 100 MCG/5ML IV SOLN: 75.0000 ug | Freq: Every day | INTRAVENOUS | Status: DC

## 2021-01-25 MED ORDER — SODIUM BICARBONATE 8.4 % IV SOLN
50.0000 meq | Freq: Once | INTRAVENOUS | Status: AC
  Administered 2021-01-25: 50 meq via INTRAVENOUS
  Filled 2021-01-25: qty 50

## 2021-01-25 MED ORDER — METOPROLOL TARTRATE 5 MG/5ML IV SOLN
5.0000 mg | Freq: Four times a day (QID) | INTRAVENOUS | Status: DC | PRN
  Administered 2021-01-26: 5 mg via INTRAVENOUS
  Filled 2021-01-25: qty 5

## 2021-01-25 MED ORDER — INSULIN DETEMIR 100 UNIT/ML ~~LOC~~ SOLN
8.0000 [IU] | Freq: Every day | SUBCUTANEOUS | Status: DC
  Filled 2021-01-25: qty 0.08

## 2021-01-25 MED ORDER — SODIUM CHLORIDE 0.9% FLUSH
10.0000 mL | INTRAVENOUS | Status: DC | PRN
  Administered 2021-01-26 – 2021-01-29 (×2): 10 mL

## 2021-01-25 MED ORDER — POTASSIUM CHLORIDE 10 MEQ/100ML IV SOLN
10.0000 meq | INTRAVENOUS | Status: AC
  Administered 2021-01-26 (×2): 10 meq via INTRAVENOUS
  Filled 2021-01-25 (×2): qty 100

## 2021-01-25 NOTE — Evaluation (Addendum)
Occupational Therapy Evaluation Patient Details Name: Ariana Wilkinson MRN: [id number] DOB: 1951-01-16 Today's Date: 01/25/2021    History of Present Illness 70 yo female presenting to [facility] health ED on 6/15 with confusion and AKI. PMH including critical blindness, type 1 diabetes on insulin pump, s/p lumbar fusion surgery at L5-S1 on 12/26/2020 by Dr. [name], readmitted for post infection/abscess with washout surgery on 01/11/2021, infection site growing Pseudomonas sensitive to cefepime, discharged on 01/16/2021 to complete 4 weeks of IV antibiotics.   Clinical Impression   PTA, pt was living with her husband and was independent; information provided by husband who was at bedside. Pt currently requiring Total A for ADLs, Max A +2 for bed mobility, and Min A +2 for sit<>standing. Pt with limited arousal, engagement, and cognition impacting her functional performance. Pt would benefit from further acute OT to facilitate safe dc. Hopeful that cognition will improve and recommend dc to home environment with HHOT for further OT to optimize safety, independence with ADLs, and return to PLOF. However, if cognition does not improve, pt will require SNF.       Follow Up Recommendations  Home health OT;Supervision/Assistance - 24 hour (Pending progress, pt may need SNF if poor arousal level)    Equipment Recommendations  None recommended by OT    Recommendations for Other Services PT consult     Precautions / Restrictions Precautions Precautions: Fall      Mobility Bed Mobility Overal bed mobility: Needs Assistance Bed Mobility: Rolling;Sidelying to Sit;Sit to Sidelying Rolling: Max assist;+2 for physical assistance Sidelying to sit: Max assist;+2 for physical assistance     Sit to sidelying: Total assist General bed mobility comments: Using log rol ltechnique to protect back after recent back sx.    Transfers Overall transfer level: Needs assistance Equipment used: 2 person hand held  assist Transfers: Sit to/from Stand Sit to Stand: Min assist;+2 physical assistance         General transfer comment: Min +2 for power up into standing. Blocking of BLEs to maintaining safety. Requiring increased tactile cues for sitting safely.    Balance Overall balance assessment: Needs assistance Sitting-balance support: No upper extremity supported;Feet supported Sitting balance-Leahy Scale: Poor Sitting balance - Comments: posterior pushing while EOB   Standing balance support: Bilateral upper extremity supported;During functional activity Standing balance-Leahy Scale: Poor Standing balance comment: reliant on physical A                           ADL either performed or assessed with clinical judgement   ADL Overall ADL's : Needs assistance/impaired                                       General ADL Comments: Total A for ADLs due to decreased cognition     Vision Baseline Vision/History: Legally blind       Perception     Praxis      Pertinent Vitals/Pain Pain Assessment: Faces Faces Pain Scale: Hurts little more Pain Location: Back Pain Descriptors / Indicators: Grimacing Pain Intervention(s): Monitored during session;Repositioned     Hand Dominance     Extremity/Trunk Assessment Upper Extremity Assessment Upper Extremity Assessment: Overall WFL for tasks assessed;Difficult to assess due to impaired cognition   Lower Extremity Assessment Lower Extremity Assessment: Defer to PT evaluation   Cervical / Trunk Assessment Cervical / Trunk Assessment: Normal;Other  exceptions Cervical / Trunk Exceptions: Tendency for posteior pushing   Communication Communication Communication: HOH (wears hearing aides)   Cognition Arousal/Alertness: Lethargic Behavior During Therapy: Flat affect Overall Cognitive Status: Difficult to assess                                 General Comments: Pt both HOH and blind at baseline. Not  verablizing anything this session. Poor following of commands. performing habitus movements like standing with tactile cues and support. Not responding to husbands voice   General Comments  Husband present throughout    Exercises     Shoulder Instructions      Home Living Family/patient expects to be discharged to:: Private residence Living Arrangements: Spouse/significant other Available Help at Discharge: Family;Available 24 hours/day Type of Home: House Home Access: Stairs to enter [employer] of Steps: 4   Home Layout: One level               Home Equipment: Cane - single point;Walker - 2 wheels          Prior Functioning/Environment Level of Independence: Independent with assistive device(s)        Comments: Using a RW around the house as needed. When out in community, pt holding husband's hand. Performs ADLs and light IADLs.        OT Problem List: Decreased strength;Decreased activity tolerance;Decreased range of motion;Impaired balance (sitting and/or standing);Decreased coordination;Decreased cognition;Decreased safety awareness;Decreased knowledge of use of DME or AE;Pain      OT Treatment/Interventions: Self-care/ADL training;Therapeutic exercise;Energy conservation;DME and/or AE instruction;Therapeutic activities;Patient/family education    OT Goals(Current goals can be found in the care plan section) Acute Rehab OT Goals Patient Stated Goal: "Get her bakc ot normal" OT Goal Formulation: With family Time For Goal Achievement: 02/08/21 Potential to Achieve Goals: Good  OT Frequency: Min 3X/week   Barriers to D/C:            Co-evaluation              AM-PAC OT "6 Clicks" Daily Activity     Outcome Measure Help from another person eating meals?: Total Help from another person taking care of personal grooming?: Total Help from another person toileting, which includes using toliet, bedpan, or urinal?: Total Help from another  person bathing (including washing, rinsing, drying)?: Total Help from another person to put on and taking off regular upper body clothing?: Total Help from another person to put on and taking off regular lower body clothing?: Total 6 Click Score: 6   End of Session Nurse Communication: Mobility status  Activity Tolerance: Patient limited by lethargy;Other (comment) (Limited secondary to cognition) Patient left: in bed;with call bell/phone within reach;with bed alarm set;with family/visitor present  OT Visit Diagnosis: Unsteadiness on feet (R26.81);Other abnormalities of gait and mobility (R26.89);Muscle weakness (generalized) (M62.81)                Time: 9480-1655 OT Time Calculation (min): 32 min Charges:  OT General Charges $OT Visit: 1 Visit OT Evaluation $OT Eval Moderate Complexity: 1 Mod OT Treatments $Self Care/Home Management : 8-22 mins  Kechia Yahnke MSOT, OTR/L Acute Rehab Pager: [phone] Office: [phone]  Theodoro Grist Dawnmarie Breon 01/25/2021, 9:42 AM

## 2021-01-25 NOTE — Progress Notes (Signed)
PROGRESS NOTE    Ariana TRENTHAM  GYB:[id number] DOB: 1951-05-13 DOA: 01/24/2021 PCP: [name], MD     Brief Narrative:  70 y.o. WF PMHx  critical blindness, DM type 1 diabetes on insulin pump, s/p Lumbar fusion surgery at L5-S1 on 12/26/2020 by Dr. [name], orthopedic surgery, readmitted for post infection/abscess with washout surgery on 01/11/2021, infection site growing Pseudomonas sensitive to cefepime, discharged on 01/16/2021 to complete 4 weeks of IV antibiotics,   Presents to [facility] health ED on 01/23/2021 for acute confusion of less than a day duration and AKI.  Creatinine elevated 4.2 up from a baseline of 1.0, BUN of 53 up from 12.  CT abdomen pelvis showing post renal obstruction from distended bladder and chronic aorto iliac, mesenteric artery calcifications.  Foley catheter was placed on 01/24/2021.  Patient was accepted as a direct admission from [facility] health ED to [facility] 71M unit by Dr. Blake Divine, [facility].  Majority of the history is obtained from patient's husband via phone and Dr. Blake Divine admission progress note.  At the time of this visit the patient is obtunded and not able to provide a history.  She had received a dose of IV Haldol in the ED at [facility] Per review of medical record on paper chart.   ED Course:  Temperature 98.6, BP 195/77, pulse 93, respiratory 20, O2 saturation 94% on room air.  Lab studies remarkable for serum sodium 133, potassium 4.3, chloride 105, serum bicarb 15, BUN 53, creatinine 4.20 with GFR of 10, serum glucose 76.  Magnesium 1.9.  proBNP 3600, WBC 13.2, hemoglobin 10.5, creatinine 32.4.  Platelet count 311.  Troponin less than 0.01.  VBG pH 7.22, PCO2 42.  Lactic acid 1.3.  Procalcitonin 4.0.  While in the ED patient received IV cefepime, isotonic bicarb, linezolid.  COVID-19 screening test negative, influenza AMB negative.  Foley catheter insertion on 01/24/2021 at [facility] ED.  EKG showing normal sinus rhythm rate of 88, nonspecific ST-T  changes.  QTc 404.   Subjective: Somnolent, does not follow commands or respond to questions.  If you attempt to touch her lips will withdraw   Assessment & Plan: Covid vaccination;   Active Problems:   AKI (acute kidney injury) (HCC)    AKI, (baseline Cr 1.0 with GFR>) -multifactorial secondary to prerenal from poor oral intake, post renal from urinary retention. Lab Results  Component Value Date   CREATININE 4.21 (H) 01/25/2021  - 6/17 Sodium bicarb-D5W 98ml/hr -6/17 sodium bicarb 1 amp -6/17 UPEP/SPEP pending -6/17 renal ultrasound pending - 6/17 strict in and out - 6/17 Daily weight -6/17 BMP 0500/1700 -Avoid nephrotoxic agent, dehydration and hypotension. -We will consult nephrology  Acute urinary retention -6/16 bladder distention: S/p  Foley catheter insertion  in the ED at [facility] .   Non anion gap metabolic acidosis in the setting of AKI. -Presented with serum bicarb of 15 with VBG pH of 7.22 -See AKI   Acute metabolic encephalopathy, likely secondary to acute illness -Per husband via phone confusion started acutely the night prior to her presentation to the ED on 01/23/2021. -6/16 UDS negative, at [facility] -Received dose of Haldol 2 mg on 6/16 at 1203 at [facility] -6/17 may have received multiple doses of narcotics prior to arrival.  We will try to ascertain -6/17 review of hardcopy chart from [facility] does not appear that patient had CT or MRI brain. -6/17 MRI brain pending R/O CVA    Type 1 diabetes with hypoglycemia -  On insulin pump (hold) - 6/17 Levemir 8 units daily - 6/17 moderate SSI    Status post lumbar fusion surgery at L5-S1 on 12/26/2020, postop infection/abscess with washout surgery on 01/11/2021 -Infection site growing Pseudomonas sensitive to cefepime, continue -Continue linezolid -She was discharged on 01/16/2021 to complete 4 weeks of IV antibiotics -Blood cultures pending  -Per husband she was scheduled  to follow-up with infectious disease outpatient on 02/05/2021.   Essential hypertension -BP not at goal, elevated -Metoprolol IV PRN  -Resume home oral antihypertensives when no longer NPO -Monitor vital signs closely   Hypothyroidism -6/17 TSH= 33.4 -Switch p.o. Synthroid to Synthroid IV 37.5 mcg daily  -6/17 ADDENDUM: Increased Synthroid IV 75 mcg daily  Hypokalemia - Potassium goal> 4 - Potassium IV 20 mEq    Chronic aorto iliac, mesenteric artery calcifications -Resume home Lipitor when no longer NPO       DVT prophylaxis: Subcu heparin Code Status: Full Family Communication: 6/17 husband at bedside discussed plan of care answered all questions Status is: Inpatient    Dispo: The patient is from: Home              Anticipated d/c is to: Home              Anticipated d/c date is: 6/25              Patient currently unstable      Consultants:    Procedures/Significant Events:  6/17 MRI brain pending 6/17 MRI L-spine pending   I have personally reviewed and interpreted all radiology studies and my findings are as above.  VENTILATOR SETTINGS:    Cultures 6/16 SARS coronavirus negative 6/16 influenza A/B negative 6/17 blood pending 6/17 urine pending   Antimicrobials: Anti-infectives (From admission, onward)    Start     Dose/Rate Route Frequency Ordered Stop   01/25/21 1000  ceFEPIme (MAXIPIME) 2 g in sodium chloride 0.9 % 100 mL IVPB        2 g 200 mL/hr over 30 Minutes Intravenous Every 24 hours 01/25/21 0527     01/25/21 0100  linezolid (ZYVOX) IVPB 600 mg        600 mg 300 mL/hr over 60 Minutes Intravenous Every 12 hours 01/24/21 2345           Devices    LINES / TUBES:      Continuous Infusions:  ceFEPime (MAXIPIME) IV     linezolid (ZYVOX) IV 600 mg (01/25/21 0102)   sodium bicarbonate 150 mEq in D5W infusion 75 mL/hr at 01/25/21 0118     Objective: Vitals:   01/24/21 2156 01/25/21 0428  BP: (!) 141/42 (!) 159/52   Pulse: 91 87  Resp: 18 18  Temp: 97.8 F (36.6 C) 98.1 F (36.7 C)  TempSrc: Oral Axillary  SpO2: 96% 98%    Intake/Output Summary (Last 24 hours) at 01/25/2021 0814 Last data filed at 01/25/2021 0700 Gross per 24 hour  Intake 426.99 ml  Output 180 ml  Net 246.99 ml   There were no vitals filed for this visit.  Examination:  General: Somnolent when husband calls name will open eyes, does not follow commands No acute respiratory distress Eyes: negative scleral hemorrhage, negative anisocoria, negative icterus ENT: Negative Runny nose, negative gingival bleeding, Neck:  Negative scars, masses, torticollis, lymphadenopathy, JVD Lungs: Clear to auscultation bilaterally without wheezes or crackles Cardiovascular: Regular rate and rhythm without murmur gallop or rub normal S1 and S2 Abdomen: negative abdominal pain, nondistended, positive soft,  bowel sounds, no rebound, no ascites, no appreciable mass Extremities: No significant cyanosis, clubbing, or edema bilateral lower extremities Skin: Negative rashes, lesions, ulcers Psychiatric: To assess secondary to altered mental status Central nervous system: Unable to assess secondary to altered mental status.     Data Reviewed: Care during the described time interval was provided by me .  I have reviewed this patient's available data, including medical history, events of note, physical examination, and all test results as part of my evaluation.  CBC: Recent Labs  Lab 01/25/21 0400  WBC 9.7  HGB 9.2*  HCT 28.5*  MCV 99.0  PLT 293   Basic Metabolic Panel: Recent Labs  Lab 01/25/21 0400  NA 131*  K 3.3*  CL 100  CO2 17*  GLUCOSE 222*  BUN 51*  CREATININE 4.21*  CALCIUM 8.1*  MG 1.7  PHOS 3.6   GFR: CrCl cannot be calculated (Unknown ideal weight.). Liver Function Tests: No results for input(s): AST, ALT, ALKPHOS, BILITOT, PROT, ALBUMIN in the last 168 hours. No results for input(s): LIPASE, AMYLASE in the last 168  hours. No results for input(s): AMMONIA in the last 168 hours. Coagulation Profile: No results for input(s): INR, PROTIME in the last 168 hours. Cardiac Enzymes: No results for input(s): CKTOTAL, CKMB, CKMBINDEX, TROPONINI in the last 168 hours. BNP (last 3 results) No results for input(s): PROBNP in the last 8760 hours. HbA1C: No results for input(s): HGBA1C in the last 72 hours. CBG: Recent Labs  Lab 01/25/21 0026 01/25/21 0420  GLUCAP 181* 186*   Lipid Profile: No results for input(s): CHOL, HDL, LDLCALC, TRIG, CHOLHDL, LDLDIRECT in the last 72 hours. Thyroid Function Tests: No results for input(s): TSH, T4TOTAL, FREET4, T3FREE, THYROIDAB in the last 72 hours. Anemia Panel: No results for input(s): VITAMINB12, FOLATE, FERRITIN, TIBC, IRON, RETICCTPCT in the last 72 hours. Sepsis Labs: Recent Labs  Lab 01/25/21 0400  PROCALCITON 7.07    No results found for this or any previous visit (from the past 240 hour(s)).       Radiology Studies: DG CHEST PORT 1 VIEW  Result Date: 01/25/2021 CLINICAL DATA:  Central line placement EXAM: PORTABLE CHEST 1 VIEW COMPARISON:  CT and radiograph 01/24/2021 FINDINGS: Left upper extremity PICC tip terminates at the level of the right atrium. No pneumothorax or effusion. Atelectatic changes in both lungs including more bandlike density in the left lower lung. No pneumothorax. No effusion. Stable cardiomediastinal contours accounting for differences technique. Degenerative changes are present in the imaged spine and shoulders. No acute osseous or soft tissue abnormality. IMPRESSION: Left upper extremity PICC tip terminates at the right atrium. Persistent bandlike opacities, likely subsegmental atelectasis and/or scarring. Electronically Signed   By: Kreg Shropshire M.D.   On: 01/25/2021 01:43        Scheduled Meds:  Chlorhexidine Gluconate Cloth  6 each Topical Daily   heparin  5,000 Units Subcutaneous Q8H   insulin aspart  0-9 Units  Subcutaneous Q4H   insulin detemir  5 Units Subcutaneous QHS   [START ON 01/28/2021] levothyroxine  37.5 mcg Intravenous Daily   Continuous Infusions:  ceFEPime (MAXIPIME) IV     linezolid (ZYVOX) IV 600 mg (01/25/21 0102)   sodium bicarbonate 150 mEq in D5W infusion 75 mL/hr at 01/25/21 0118     LOS: 1 day    Time spent:40 min    Mykel Sponaugle, Roselind Messier, MD [facility]   If 7PM-7AM, please contact night-coverage 01/25/2021, 8:14 AM

## 2021-01-25 NOTE — Progress Notes (Signed)
Arrived to draw labs, patient not in room.  Per Sue Lush, RN she is in radiology.  Sue Lush will notify IV team via consult once she has returned to room.

## 2021-01-25 NOTE — Evaluation (Signed)
Physical Therapy Evaluation Patient Details Name: Ariana Wilkinson MRN: [id number] DOB: 03/17/1951 Today's Date: 01/25/2021   History of Present Illness  70 yo female presenting to [facility] health ED on 6/15 with confusion and AKI. PMH including critical blindness, type 1 diabetes on insulin pump, s/p lumbar fusion surgery at L5-S1 on 12/26/2020 by Dr. [name], readmitted for post infection/abscess with washout surgery on 01/11/2021, infection site growing Pseudomonas sensitive to cefepime, discharged on 01/16/2021 to complete 4 weeks of IV antibiotics.  Clinical Impression  Pt admitted due to above deficits. Pt was I with intermittent use of AD PTA. Pt is now very lethargic and unable to fully participate in evaluation. Pt resistant to assist from therapist for movement. Pt will benefit from skilled PT to address balance, coordination endurance andsafety to maximize independence with functional mobility prior to discharge    Follow Up Recommendations SNF vs Home health PT;Supervision/Assistance - 24 hour    Equipment Recommendations  Other (comment) (TBD, possible w/c, possible lift pending progress wtih PT)    Recommendations for Other Services       Precautions / Restrictions Precautions Precautions: Fall Restrictions Weight Bearing Restrictions: No      Mobility  Bed Mobility Overal bed mobility: Needs Assistance Bed Mobility: Rolling;Sidelying to Sit;Sit to Sidelying Rolling: Total assist Sidelying to sit: Max assist     Sit to sidelying: Max assist General bed mobility comments: used log roll, increased resistant to initiation of movement, Max A to transition to sitting wtih some assist for LE movement. Pt assistingw ith LE movement when returning to bed    Transfers Overall transfer level: Needs assistance Equipment used: 2 person hand held assist Transfers: Sit to/from Stand Sit to Stand: Min assist;+2 physical assistance         General transfer comment: attempted to  stand wtih totala ssist from therapist to bring pt forward and press up and unable to fully clear buttocks from bed, increased posterior pushin resisting all assist from therapist  Ambulation/Gait                Stairs            Wheelchair Mobility    Modified Rankin (Stroke Patients Only)       Balance Overall balance assessment: Needs assistance Sitting-balance support: No upper extremity supported;Feet supported Sitting balance-Leahy Scale: Zero Sitting balance - Comments: posterior pushing while EOB   Standing balance support: Bilateral upper extremity supported;During functional activity Standing balance-Leahy Scale: Poor Standing balance comment: reliant on physical A                             Pertinent Vitals/Pain Pain Assessment: Faces Faces Pain Scale: Hurts little more Pain Location: Back Pain Descriptors / Indicators: Grimacing Pain Intervention(s): Monitored during session;Repositioned    Home Living Family/patient expects to be discharged to:: Private residence Living Arrangements: Spouse/significant other Available Help at Discharge: Family;Available 24 hours/day Type of Home: House Home Access: Stairs to enter   [employer] of Steps: 4 Home Layout: One level Home Equipment: Cane - single point;Walker - 4 wheels      Prior Function Level of Independence: Independent with assistive device(s)         Comments: Using a rollator around the house as needed. When out in community, pt holding husband's hand. Performs ADLs and light IADLs.     Hand Dominance        Extremity/Trunk Assessment   Upper  Extremity Assessment Upper Extremity Assessment: Defer to OT evaluation    Lower Extremity Assessment Lower Extremity Assessment: Difficult to assess due to impaired cognition    Cervical / Trunk Assessment Cervical / Trunk Assessment: Normal;Other exceptions Cervical / Trunk Exceptions: Tendency for posteior  pushing  Communication   Communication: HOH  Cognition Arousal/Alertness: Lethargic Behavior During Therapy: Flat affect Overall Cognitive Status: Difficult to assess                                 General Comments: Pt both HOH and blind at baseline. Not verablizing anything this session. Poor following of commands. Opened eyes at end of evaluation, but not responsive      General Comments General comments (skin integrity, edema, etc.): husband present. stating she was up and moving easily following back surgery    Exercises     Assessment/Plan    PT Assessment Patient needs continued PT services  PT Problem List Decreased mobility;Decreased coordination;Decreased activity tolerance;Decreased cognition;Decreased balance       PT Treatment Interventions Therapeutic exercise;Gait training;Balance training;Neuromuscular re-education;Functional mobility training;Therapeutic activities;Patient/family education;Cognitive remediation    PT Goals (Current goals can be found in the Care Plan section)  Acute Rehab PT Goals Patient Stated Goal: To get her more awake PT Goal Formulation: With family Time For Goal Achievement: 02/08/21 Potential to Achieve Goals: Fair    Frequency Min 3X/week   Barriers to discharge        Co-evaluation               AM-PAC PT "6 Clicks" Mobility  Outcome Measure Help needed turning from your back to your side while in a flat bed without using bedrails?: Total Help needed moving from lying on your back to sitting on the side of a flat bed without using bedrails?: A Lot Help needed moving to and from a bed to a chair (including a wheelchair)?: Total Help needed standing up from a chair using your arms (e.g., wheelchair or bedside chair)?: Total Help needed to walk in hospital room?: Total Help needed climbing 3-5 steps with a railing? : Total 6 Click Score: 7    End of Session Equipment Utilized During Treatment: Gait  belt Activity Tolerance: Patient limited by lethargy Patient left: in bed;with call bell/phone within reach;with bed alarm set;with family/visitor present Nurse Communication: Mobility status PT Visit Diagnosis: Unsteadiness on feet (R26.81);Other abnormalities of gait and mobility (R26.89)    Time: 0258-5277 PT Time Calculation (min) (ACUTE ONLY): 12 min   Charges:   PT Evaluation $PT Eval Low Complexity: 1 Low          Ginette Otto, DPT Acute Rehabilitation Services [phone]   Lucretia Field 01/25/2021, 12:34 PM

## 2021-01-25 NOTE — Progress Notes (Signed)
Pharmacy Antibiotic Note  Ariana Wilkinson is a 71 y.o. female admitted on 01/24/2021 with post-op infection/abscess (growing pseudomonas per MD note).  Pharmacy has been consulted for Cefepime dosing. Acute renal failure with Scr 4.2. Has been on Cefepime, last dose was 1g at [facility] on 6/16 ~1800.   Plan: Cefepime 2g IV q24h Zyvox per MD Trend WBC, temp, renal function  F/U infectious work-up     Temp (24hrs), Avg:98 F (36.7 C), Min:97.8 F (36.6 C), Max:98.1 F (36.7 C)  Recent Labs  Lab 01/25/21 0400  WBC 9.7  CREATININE 4.21*    CrCl cannot be calculated (Unknown ideal weight.).    Allergies  Allergen Reactions   Macrodantin [Nitrofurantoin] Shortness Of Breath   Abran Duke, PharmD, BCPS Clinical Pharmacist Phone: [phone]

## 2021-01-25 NOTE — Progress Notes (Signed)
Inpatient Diabetes Program Recommendations  AACE/ADA: New Consensus Statement on Inpatient Glycemic Control (2015)  Target Ranges:  Prepandial:   less than 140 mg/dL      Peak postprandial:   less than 180 mg/dL (1-2 hours)      Critically ill patients:  140 - 180 mg/dL   Lab Results  Component Value Date   GLUCAP 268 (H) 01/25/2021    Review of Glycemic Control  Diabetes history: DM1 Outpatient Diabetes medications: Insulin pump Current orders for Inpatient glycemic control: Levemir 5 units QHS, Novolog 0-9 units Q4H  CBGs today:  186,168, 167, 268 mg/dL Endo: Dr Sharl Ma  Inpatient Diabetes Program Recommendations:    Agree with orders. Avoid hypoglycemia.  If FBS > 180 mg/dL, increase Levemir to 4 units BID Will need meal coverage insulin when po intake begins  Attempted to speak with pt about her pump settings. RN states pt confused and not appropriate for insulin pump in the hospital.   Will follow glucose trends daily and make recs.  Thank you. Ailene Ards, RD, LDN, CDE Inpatient Diabetes Coordinator [phone]

## 2021-01-26 LAB — GLUCOSE, CAPILLARY
Glucose-Capillary: 153 mg/dL — ABNORMAL HIGH (ref 70–99)
Glucose-Capillary: 181 mg/dL — ABNORMAL HIGH (ref 70–99)
Glucose-Capillary: 185 mg/dL — ABNORMAL HIGH (ref 70–99)
Glucose-Capillary: 210 mg/dL — ABNORMAL HIGH (ref 70–99)
Glucose-Capillary: 227 mg/dL — ABNORMAL HIGH (ref 70–99)
Glucose-Capillary: 254 mg/dL — ABNORMAL HIGH (ref 70–99)
Glucose-Capillary: 284 mg/dL — ABNORMAL HIGH (ref 70–99)

## 2021-01-26 LAB — CBC WITH DIFFERENTIAL/PLATELET
Abs Immature Granulocytes: 0.09 10*3/uL — ABNORMAL HIGH (ref 0.00–0.07)
Basophils Absolute: 0.1 10*3/uL (ref 0.0–0.1)
Basophils Relative: 1 %
Eosinophils Absolute: 0 10*3/uL (ref 0.0–0.5)
Eosinophils Relative: 0 %
HCT: 28.2 % — ABNORMAL LOW (ref 36.0–46.0)
Hemoglobin: 9.2 g/dL — ABNORMAL LOW (ref 12.0–15.0)
Immature Granulocytes: 1 %
Lymphocytes Relative: 9 %
Lymphs Abs: 1.1 10*3/uL (ref 0.7–4.0)
MCH: 31.9 pg (ref 26.0–34.0)
MCHC: 32.6 g/dL (ref 30.0–36.0)
MCV: 97.9 fL (ref 80.0–100.0)
Monocytes Absolute: 1.2 10*3/uL — ABNORMAL HIGH (ref 0.1–1.0)
Monocytes Relative: 9 %
Neutro Abs: 9.8 10*3/uL — ABNORMAL HIGH (ref 1.7–7.7)
Neutrophils Relative %: 80 %
Platelets: 297 10*3/uL (ref 150–400)
RBC: 2.88 MIL/uL — ABNORMAL LOW (ref 3.87–5.11)
RDW: 14 % (ref 11.5–15.5)
WBC: 12.3 10*3/uL — ABNORMAL HIGH (ref 4.0–10.5)
nRBC: 0 % (ref 0.0–0.2)

## 2021-01-26 LAB — COMPREHENSIVE METABOLIC PANEL
ALT: 8 U/L (ref 0–44)
AST: 12 U/L — ABNORMAL LOW (ref 15–41)
Albumin: 1.7 g/dL — ABNORMAL LOW (ref 3.5–5.0)
Alkaline Phosphatase: 76 U/L (ref 38–126)
Anion gap: 12 (ref 5–15)
BUN: 54 mg/dL — ABNORMAL HIGH (ref 8–23)
CO2: 22 mmol/L (ref 22–32)
Calcium: 7.6 mg/dL — ABNORMAL LOW (ref 8.9–10.3)
Chloride: 98 mmol/L (ref 98–111)
Creatinine, Ser: 4.77 mg/dL — ABNORMAL HIGH (ref 0.44–1.00)
GFR, Estimated: 9 mL/min — ABNORMAL LOW (ref >60)
Glucose, Bld: 242 mg/dL — ABNORMAL HIGH (ref 70–99)
Potassium: 3.3 mmol/L — ABNORMAL LOW (ref 3.5–5.1)
Sodium: 132 mmol/L — ABNORMAL LOW (ref 135–145)
Total Bilirubin: 0.4 mg/dL (ref 0.3–1.2)
Total Protein: 5.2 g/dL — ABNORMAL LOW (ref 6.5–8.1)

## 2021-01-26 LAB — BASIC METABOLIC PANEL
Anion gap: 13 (ref 5–15)
BUN: 53 mg/dL — ABNORMAL HIGH (ref 8–23)
CO2: 22 mmol/L (ref 22–32)
Calcium: 7.2 mg/dL — ABNORMAL LOW (ref 8.9–10.3)
Chloride: 94 mmol/L — ABNORMAL LOW (ref 98–111)
Creatinine, Ser: 5.05 mg/dL — ABNORMAL HIGH (ref 0.44–1.00)
GFR, Estimated: 9 mL/min — ABNORMAL LOW (ref >60)
Glucose, Bld: 258 mg/dL — ABNORMAL HIGH (ref 70–99)
Potassium: 3 mmol/L — ABNORMAL LOW (ref 3.5–5.1)
Sodium: 129 mmol/L — ABNORMAL LOW (ref 135–145)

## 2021-01-26 LAB — LIPID PANEL
Cholesterol: 160 mg/dL (ref 0–200)
HDL: 39 mg/dL — ABNORMAL LOW (ref >40)
LDL Cholesterol: 99 mg/dL (ref 0–99)
Total CHOL/HDL Ratio: 4.1 RATIO
Triglycerides: 110 mg/dL (ref <150)
VLDL: 22 mg/dL (ref 0–40)

## 2021-01-26 LAB — PHOSPHORUS: Phosphorus: 3.2 mg/dL (ref 2.5–4.6)

## 2021-01-26 LAB — PROCALCITONIN: Procalcitonin: 8.36 ng/mL

## 2021-01-26 LAB — MAGNESIUM: Magnesium: 1.6 mg/dL — ABNORMAL LOW (ref 1.7–2.4)

## 2021-01-26 MED ORDER — SODIUM CHLORIDE 0.9 % IV BOLUS
1500.0000 mL | Freq: Once | INTRAVENOUS | Status: AC
  Administered 2021-01-26: 1500 mL via INTRAVENOUS

## 2021-01-26 MED ORDER — POTASSIUM CHLORIDE 10 MEQ/100ML IV SOLN
10.0000 meq | INTRAVENOUS | Status: AC
  Administered 2021-01-26 – 2021-01-27 (×4): 10 meq via INTRAVENOUS
  Filled 2021-01-26 (×4): qty 100

## 2021-01-26 MED ORDER — HYDRALAZINE HCL 20 MG/ML IJ SOLN: 5.0000 mg | Freq: Four times a day (QID) | INTRAMUSCULAR | Status: DC | PRN

## 2021-01-26 MED ORDER — INSULIN DETEMIR 100 UNIT/ML ~~LOC~~ SOLN
12.0000 [IU] | Freq: Every day | SUBCUTANEOUS | Status: DC
  Administered 2021-01-26 – 2021-01-31 (×6): 12 [IU] via SUBCUTANEOUS
  Filled 2021-01-26 (×7): qty 0.12

## 2021-01-26 MED ORDER — LEVOTHYROXINE SODIUM 100 MCG/5ML IV SOLN
75.0000 ug | Freq: Every day | INTRAVENOUS | Status: DC
  Administered 2021-01-26 – 2021-02-02 (×8): 75 ug via INTRAVENOUS
  Filled 2021-01-26 (×8): qty 5

## 2021-01-26 MED ORDER — POTASSIUM CHLORIDE 10 MEQ/100ML IV SOLN: 10.0000 meq | INTRAVENOUS | Status: AC

## 2021-01-26 MED ORDER — MAGNESIUM SULFATE 2 GM/50ML IV SOLN
2.0000 g | Freq: Once | INTRAVENOUS | Status: AC
  Administered 2021-01-26: 2 g via INTRAVENOUS
  Filled 2021-01-26: qty 50

## 2021-01-26 MED ORDER — SODIUM CHLORIDE 0.9 % IV SOLN
1.0000 g | INTRAVENOUS | Status: AC
  Administered 2021-01-26 – 2021-01-31 (×6): 1 g via INTRAVENOUS
  Filled 2021-01-26 (×6): qty 1

## 2021-01-26 MED ORDER — METOPROLOL TARTRATE 5 MG/5ML IV SOLN
5.0000 mg | Freq: Three times a day (TID) | INTRAVENOUS | Status: DC
  Administered 2021-01-26 – 2021-02-02 (×20): 5 mg via INTRAVENOUS
  Filled 2021-01-26 (×20): qty 5

## 2021-01-26 NOTE — Progress Notes (Signed)
PROGRESS NOTE    Ariana Wilkinson  HFW:[id number] DOB: 1951-03-28 DOA: 01/24/2021 PCP: [name], MD     Brief Narrative:  70 y.o. WF PMHx  critical blindness, DM type 1 diabetes on insulin pump, s/p Lumbar fusion surgery at L5-S1 on 12/26/2020 by Dr. [name], orthopedic surgery, readmitted for post infection/abscess with washout surgery on 01/11/2021, infection site growing Pseudomonas sensitive to cefepime, discharged on 01/16/2021 to complete 4 weeks of IV antibiotics,   Presents to [facility] health ED on 01/23/2021 for acute confusion of less than a day duration and AKI.  Creatinine elevated 4.2 up from a baseline of 1.0, BUN of 53 up from 12.  CT abdomen pelvis showing post renal obstruction from distended bladder and chronic aorto iliac, mesenteric artery calcifications.  Foley catheter was placed on 01/24/2021.  Patient was accepted as a direct admission from [facility] health ED to [facility] 89M unit by Dr. Blake Divine, [facility].  Majority of the history is obtained from patient's husband via phone and Dr. Blake Divine admission progress note.  At the time of this visit the patient is obtunded and not able to provide a history.  She had received a dose of IV Haldol in the ED at [facility] Per review of medical record on paper chart.   ED Course:  Temperature 98.6, BP 195/77, pulse 93, respiratory 20, O2 saturation 94% on room air.  Lab studies remarkable for serum sodium 133, potassium 4.3, chloride 105, serum bicarb 15, BUN 53, creatinine 4.20 with GFR of 10, serum glucose 76.  Magnesium 1.9.  proBNP 3600, WBC 13.2, hemoglobin 10.5, creatinine 32.4.  Platelet count 311.  Troponin less than 0.01.  VBG pH 7.22, PCO2 42.  Lactic acid 1.3.  Procalcitonin 4.0.  While in the ED patient received IV cefepime, isotonic bicarb, linezolid.  COVID-19 screening test negative, influenza AMB negative.  Foley catheter insertion on 01/24/2021 at [facility] health ED.  EKG showing normal sinus rhythm rate of 88, nonspecific ST-T  changes.  QTc 404.   Subjective: 4/18 afebrile overnight, BP trended up overnight.  More awake this a.m. however still does not follow commands or answer questions.   Assessment & Plan: Covid vaccination;   Active Problems:   AKI (acute kidney injury) (HCC)   Acute urinary retention   Metabolic acidosis, normal anion gap (NAG)   Acute metabolic encephalopathy    AKI, (baseline Cr 1.0 with GFR>) -multifactorial secondary to prerenal from poor oral intake, post renal from urinary retention. Lab Results  Component Value Date   CREATININE 4.77 (H) 01/26/2021   CREATININE 4.49 (H) 01/25/2021   CREATININE 4.21 (H) 01/25/2021  - 6/17 Sodium bicarb-D5W 76ml/hr -6/17 sodium bicarb 1 amp -6/17 UPEP/SPEP pending -6/17 renal ultrasound pending - 6/17 strict in and out -53 ml - 6/17 Daily weight Filed Weights   01/26/21 0900  Weight: 54 kg  -6/17 BMP 0500/1700 -Avoid nephrotoxic agent, dehydration and hypotension. - 6/18 discussed case with Dr. Delano Metz nephrology who will see patient.  Await recommendations  Acute urinary retention -6/16 bladder distention: S/p  Foley catheter insertion  in the ED at [facility]   Non anion gap metabolic acidosis in the setting of AKI. -Presented with serum bicarb of 15 with VBG pH of 7.22 -See AKI   Acute metabolic encephalopathy, likely secondary to acute illness -Per husband via phone confusion started acutely the night prior to her presentation to the ED on 01/23/2021. -6/16 UDS negative, at [facility] -Received dose of Haldol 2 mg  on 6/16 at 1203 at [facility] -6/17 may have received multiple doses of narcotics prior to arrival.  We will try to ascertain -6/17 review of hardcopy chart from [facility] does not appear that patient had CT or MRI brain. -6/17 MRI brain pending R/O CVA    Type 1 diabetes with hypoglycemia -On insulin pump (hold) - 6/18 increase Levemir 12 units daily - 6/17 moderate SSI     Status post lumbar fusion surgery at L5-S1 on 12/26/2020, postop infection/abscess with washout surgery on 01/11/2021 -Infection site growing Pseudomonas sensitive to cefepime, continue -Continue linezolid -She was discharged on 01/16/2021 to complete 4 weeks of IV antibiotics -Blood cultures pending  -Per husband she was scheduled to follow-up with infectious disease outpatient on 02/05/2021.   Essential hypertension -BP not at goal, elevated - 6/18 change Metoprolol IV 5 MG TID -6/18 Hydralazine IV SBP> 150 or DBP> 100 -Resume home oral antihypertensives when no longer NPO -Monitor vital signs closely   Hypothyroidism -6/17 TSH= 33.4 -Switch p.o. Synthroid to Synthroid IV 37.5 mcg daily  -6/17 ADDENDUM: Increased Synthroid IV 75 mcg daily  Hypokalemia - Potassium goal> 4 - 6/18 potassium IV 40 mEq  Hypomagnesmia - Magnesium goal> 2 - Magnesium IV 2 g   Chronic aorto iliac, mesenteric artery calcifications -Resume home Lipitor when no longer NPO       DVT prophylaxis: Subcu heparin Code Status: Full Family Communication: 6/18 husband at bedside discussed plan of care answered all questions Status is: Inpatient    Dispo: The patient is from: Home              Anticipated d/c is to: Home              Anticipated d/c date is: 6/25              Patient currently unstable      Consultants:  Nephrology   Procedures/Significant Events:  6/17 MRI brain:No acute intracranial abnormality. 2. Generalized volume loss without a clear lobar predilection 6/17 MRI L-spine Interval postoperative changes of L5-S1 posterior and interbody fusion. No appreciable epidural fluid collection, although evaluation is somewhat degraded by motion artifact and lack of postcontrast imaging. 2. Multilevel degenerative changes of the lumbar spine are similar to the prior study. There is moderate canal stenosis at L3-4 and L4-5 and mild canal stenosis at L1-2 and L2-3. 6/17 US  renal:Echogenic kidneys consistent with medical renal disease. No hydronephrosis 2. Small free fluid in the right lower quadrant. Trace right pleural effusion.   I have personally reviewed and interpreted all radiology studies and my findings are as above.  VENTILATOR SETTINGS: Room air 6/18 SPO2 94%   Cultures 6/16 SARS coronavirus negative 6/16 influenza A/B negative 6/17 blood pending 6/17 urine pending   Antimicrobials: Anti-infectives (From admission, onward)    Start     Ordered Stop   01/26/21 1245  ceFEPIme (MAXIPIME) 1 g in sodium chloride 0.9 % 100 mL IVPB        01/26/21 1152     01/25/21 1000  ceFEPIme (MAXIPIME) 2 g in sodium chloride 0.9 % 100 mL IVPB  Status:  Discontinued        01/25/21 0527 01/26/21 1152   01/25/21 0100  linezolid (ZYVOX) IVPB 600 mg        01/24/21 2345           Devices    LINES / TUBES:      Continuous Infusions:  ceFEPime (MAXIPIME) IV 1  g (01/26/21 1301)   linezolid (ZYVOX) IV 600 mg (01/26/21 1032)   sodium bicarbonate 150 mEq in D5W infusion 75 mL/hr at 01/25/21 2336     Objective: Vitals:   01/25/21 2036 01/26/21 0544 01/26/21 0900 01/26/21 1019  BP: (!) 158/71 (!) 185/62  (!) 180/63  Pulse: 92 82  82  Resp: 16 17  17   Temp: 98.6 F (37 C) 98.3 F (36.8 C)  98.5 F (36.9 C)  TempSrc: Axillary Axillary  Oral  SpO2: 93% 98%  94%  Weight:   54 kg   Height:   4\' 11"  (1.499 m)     Intake/Output Summary (Last 24 hours) at 01/26/2021 1456 Last data filed at 01/26/2021 0800 Gross per 24 hour  Intake 0 ml  Output 300 ml  Net -300 ml    Filed Weights   01/26/21 0900  Weight: 54 kg    Examination:  General: Somnolent when husband calls name will open eyes, does not follow commands No acute respiratory distress Eyes: negative scleral hemorrhage, negative anisocoria, negative icterus ENT: Negative Runny nose, negative gingival bleeding, Neck:  Negative scars, masses, torticollis, lymphadenopathy,  JVD Lungs: Clear to auscultation bilaterally without wheezes or crackles Cardiovascular: Regular rate and rhythm without murmur gallop or rub normal S1 and S2 Abdomen: negative abdominal pain, nondistended, positive soft, bowel sounds, no rebound, no ascites, no appreciable mass Extremities: No significant cyanosis, clubbing, or edema bilateral lower extremities Skin: Negative rashes, lesions, ulcers Psychiatric: To assess secondary to altered mental status Central nervous system: Unable to assess secondary to altered mental status.     Data Reviewed: Care during the described time interval was provided by me .  I have reviewed this patient's available data, including medical history, events of note, physical examination, and all test results as part of my evaluation.  CBC: Recent Labs  Lab 01/25/21 0400 01/26/21 0318  WBC 9.7 12.3*  NEUTROABS  --  9.8*  HGB 9.2* 9.2*  HCT 28.5* 28.2*  MCV 99.0 97.9  PLT 293 297    Basic Metabolic Panel: Recent Labs  Lab 01/25/21 0400 01/25/21 1658 01/26/21 0318  NA 131* 132* 132*  K 3.3* 3.3* 3.3*  CL 100 100 98  CO2 17* 18* 22  GLUCOSE 222* 258* 242*  BUN 51* 53* 54*  CREATININE 4.21* 4.49* 4.77*  CALCIUM 8.1* 7.9* 7.6*  MG 1.7  --  1.6*  PHOS 3.6  --  3.2    GFR: Estimated Creatinine Clearance: 8.3 mL/min (A) (by C-G formula based on SCr of 4.77 mg/dL (H)). Liver Function Tests: Recent Labs  Lab 01/26/21 0318  AST 12*  ALT 8  ALKPHOS 76  BILITOT 0.4  PROT 5.2*  ALBUMIN 1.7*   No results for input(s): LIPASE, AMYLASE in the last 168 hours. Recent Labs  Lab 01/25/21 1658  AMMONIA 34   Coagulation Profile: No results for input(s): INR, PROTIME in the last 168 hours. Cardiac Enzymes: No results for input(s): CKTOTAL, CKMB, CKMBINDEX, TROPONINI in the last 168 hours. BNP (last 3 results) No results for input(s): PROBNP in the last 8760 hours. HbA1C: No results for input(s): HGBA1C in the last 72 hours. CBG: Recent  Labs  Lab 01/25/21 2045 01/25/21 2351 01/26/21 0346 01/26/21 0840 01/26/21 1152  GLUCAP 241* 210* 227* 181* 185*    Lipid Profile: Recent Labs    01/25/21 1658 01/26/21 0318  CHOL 159 160  HDL 40* 39*  LDLCALC 95 99  TRIG 118 110  CHOLHDL 4.0 4.1  Thyroid Function Tests: Recent Labs    01/25/21 1658  TSH 33.412*   Anemia Panel: No results for input(s): VITAMINB12, FOLATE, FERRITIN, TIBC, IRON, RETICCTPCT in the last 72 hours. Sepsis Labs: Recent Labs  Lab 01/25/21 0400 01/26/21 0318  PROCALCITON 7.07 8.36     No results found for this or any previous visit (from the past 240 hour(s)).       Radiology Studies: MR BRAIN WO CONTRAST  Result Date: 01/25/2021 CLINICAL DATA:  Encephalopathy.  Possible stroke. EXAM: MRI HEAD WITHOUT CONTRAST TECHNIQUE: Multiplanar, multiecho pulse sequences of the brain and surrounding structures were obtained without intravenous contrast. COMPARISON:  None. FINDINGS: Brain: No acute infarct, mass effect or extra-axial collection. No acute or chronic hemorrhage. Normal white matter signal. Generalized volume loss without a clear lobar predilection. The midline structures are normal. Vascular: Major flow voids are preserved. Skull and upper cervical spine: Normal calvarium and skull base. Visualized upper cervical spine and soft tissues are normal. Sinuses/Orbits:Binocular volume loss and sequelae of hemorrhage. Sinuses are clear. IMPRESSION: 1. No acute intracranial abnormality. 2. Generalized volume loss without a clear lobar predilection. Electronically Signed   By: Deatra Robinson M.D.   On: 01/25/2021 20:19   MR LUMBAR SPINE WO CONTRAST  Result Date: 01/25/2021 CLINICAL DATA:  Encephalopathy. History of L5-S1 spinal fusion on 12/26/2020 with subsequent infection and washout on 01/11/2021 EXAM: MRI LUMBAR SPINE WITHOUT CONTRAST TECHNIQUE: Multiplanar, multisequence MR imaging of the lumbar spine was performed. No intravenous contrast was  administered. COMPARISON:  MRI 12/24/2020 FINDINGS: Technical note: Despite efforts by the technologist and patient, motion artifact is present on today's exam and could not be eliminated. Artifact primarily affects the axial T2 weighted sequence. This reduces exam sensitivity and specificity. Segmentation: Standard. Alignment:  Mild dextrocurvature.  No significant listhesis. Vertebrae: Interval postoperative changes of L5-S1 posterior and interbody fusion. No fracture. No evidence to suggest discitis. No suspicious bone lesion. Multilevel discogenic endplate marrow changes. Conus medullaris and cauda equina: Conus extends to the L1-2 level. There is bunching of the cauda equina nerve roots above the L3-4 level of stenosis. No appreciable epidural fluid collection, although evaluation is somewhat degraded by motion artifact and lack of postcontrast imaging. Paraspinal and other soft tissues: Interval postoperative changes within the lower back. No postoperative fluid collections. Disc levels: T12-L1: Mild diffuse disc bulge without foraminal or canal stenosis. Unchanged. L1-L2: Mild diffuse disc bulge with small biforaminal protrusions. Previously seen right subarticular disc herniation is no longer evident. There is mild canal stenosis and mild bilateral foraminal recess stenosis which are unchanged. L2-L3: Diffuse disc osteophyte complex resulting in mild canal stenosis and mild bilateral foraminal stenosis. Unchanged. L3-L4: Diffuse disc osteophyte complex with mild bilateral facet arthropathy and prominence of the epidural fat. Findings result in moderate canal stenosis with mild bilateral foraminal stenosis. Unchanged. L4-L5: Diffuse disc bulge with mild bilateral facet arthropathy and ligamentum flavum buckling. Prominence of the epidural fat. Findings result in moderate canal stenosis with mild left foraminal stenosis. Unchanged. L5-S1: Interval posterior and interbody fusion. Left paracentral disc  protrusion and bilateral facet hypertrophy. Suspect mild bilateral foraminal stenosis. No significant canal stenosis. The degree of foraminal narrowing on the left may be slightly improved from prior. IMPRESSION: 1. Interval postoperative changes of L5-S1 posterior and interbody fusion. No appreciable epidural fluid collection, although evaluation is somewhat degraded by motion artifact and lack of postcontrast imaging. 2. Multilevel degenerative changes of the lumbar spine are similar to the prior study. There is moderate canal  stenosis at L3-4 and L4-5 and mild canal stenosis at L1-2 and L2-3. Electronically Signed   By: Duanne Guess D.O.   On: 01/25/2021 20:26   US RENAL  Result Date: 01/25/2021 CLINICAL DATA:  Acute renal failure EXAM: RENAL / URINARY TRACT ULTRASOUND COMPLETE COMPARISON:  01/24/2021 FINDINGS: Right Kidney: Renal measurements: 8.9 x 3.5 x 4.8 cm = volume: 176 mL. Cortex is echogenic. No mass or hydronephrosis. Trace perinephric fluid. Left Kidney: Renal measurements: 7.7 x 4 x 4.1 cm = volume: 66.5 mL. Cortex is echogenic. No mass or hydronephrosis. Bladder: Decompressed by Foley catheter. Other: Small free fluid in the right lower quadrant. Trace right pleural effusion IMPRESSION: 1. Echogenic kidneys consistent with medical renal disease. No hydronephrosis 2. Small free fluid in the right lower quadrant. Trace right pleural effusion. Electronically Signed   By: Jasmine Pang M.D.   On: 01/25/2021 17:20   DG CHEST PORT 1 VIEW  Result Date: 01/25/2021 CLINICAL DATA:  Central line placement EXAM: PORTABLE CHEST 1 VIEW COMPARISON:  CT and radiograph 01/24/2021 FINDINGS: Left upper extremity PICC tip terminates at the level of the right atrium. No pneumothorax or effusion. Atelectatic changes in both lungs including more bandlike density in the left lower lung. No pneumothorax. No effusion. Stable cardiomediastinal contours accounting for differences technique. Degenerative changes are  present in the imaged spine and shoulders. No acute osseous or soft tissue abnormality. IMPRESSION: Left upper extremity PICC tip terminates at the right atrium. Persistent bandlike opacities, likely subsegmental atelectasis and/or scarring. Electronically Signed   By: Kreg Shropshire M.D.   On: 01/25/2021 01:43        Scheduled Meds:  Chlorhexidine Gluconate Cloth  6 each Topical Daily   heparin  5,000 Units Subcutaneous Q8H   insulin aspart  0-15 Units Subcutaneous Q4H   insulin detemir  8 Units Subcutaneous QHS   levothyroxine  75 mcg Intravenous Daily   Continuous Infusions:  ceFEPime (MAXIPIME) IV 1 g (01/26/21 1301)   linezolid (ZYVOX) IV 600 mg (01/26/21 1032)   sodium bicarbonate 150 mEq in D5W infusion 75 mL/hr at 01/25/21 2336     LOS: 2 days    Time spent:40 min    Chelci Winter[place], Roselind Messier, MD [facility]   If 7PM-7AM, please contact night-coverage 01/26/2021, 2:56 PM

## 2021-01-26 NOTE — Consult Note (Signed)
Renal Service Consult Note [facility]  Ariana Wilkinson 01/26/2021 Sol Blazing, MD Requesting Physician: Dr. Sherral Hammers  Reason for Consult: Renal failure HPI: The patient is a 70 y.o. year-old w/ hx of DM type 1 on insulin pump, hx back surgery 01/05/21 L5-S1 fusion at [facility]) then readmitted for post infection Tresa Garter w/ washout surgery on 01/11/21 also at [facility] on 6/08 to complete 4 wks of IV antibiotics (possibly cefepime and linezolid, grew pseudomonas). He then presented to OSH ED on 01/23/21 for acute onset of confusion. Creat was up to 4.2.  CT abd showed obstruction from distended bladder,foley cath was placed on 6/16. Pt transferred here for rising creatinine up to 4.7.  Here pt was given bicarb IVF's at 75 cc/hr. No bolus needed, BP's are normal to high. Asked to see for renal failure.   Pt seen in her room. 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 Does not follow verbal commands. No hx provided.     Past Medical History  Past Medical History:  Diagnosis Date   Diabetes mellitus without complication (HCC)    Metabolic acidosis, normal anion gap (NAG) 01/25/2021   Past Surgical History  Past Surgical History:  Procedure Laterality Date   EYE SURGERY     Family History No family history on file. Social History  has no history on file for tobacco use, alcohol use, and drug use. Allergies  Allergies  Allergen Reactions   Ace Inhibitors Cough   Diazepam Anxiety   Metoclopramide Other (See Comments)    Can not function   Naproxen Swelling   Nitrofurantoin Shortness Of Breath and Other (See Comments)    Hallucination   Olmesartan Other (See Comments)    Hyperkalemia; Ran up her potassium     Alendronate Sodium Other (See Comments)    acid reflux   Erythromycin Base Other (See Comments)   Hydrochlorothiazide Other (See Comments)    Was hurting in her chest and throwing up   Povidone-Iodine Itching   Home medications Prior to  Admission medications   Medication Sig Start Date End Date Taking? Authorizing Provider  alendronate (FOSAMAX) 70 MG tablet Take 70 mg by mouth once a week.   Yes [provider]  amLODipine (NORVASC) 5 MG tablet Take 5 mg by mouth daily.   Yes [provider]  atorvastatin (LIPITOR) 80 MG tablet Take 80 mg by mouth daily.   Yes [provider]  BAQSIMI ONE PACK 3 MG/DOSE POWD Place 1 spray into the nose See admin instructions. INHALE 1 SPRAY INTO SINGLE NOSTRIL. IF NO RESPONSE, MAY REPEAT IN 15 MINUTES USING A NEW DEVICE. USE AS NEEDED FOR LOW BLOOD SUGAR REACTION. 01/15/21  Yes [provider]  Calcium Carb-Cholecalciferol 600-200 MG-UNIT TABS Take 1 tablet by mouth daily.   Yes [provider]  carvedilol (COREG) 25 MG tablet Take 25 mg by mouth 2 (two) times daily. 11/16/20  Yes [provider]  ceFEPIme (MAXIPIME) 2 g injection Inject 2 g into the muscle every 12 (twelve) hours. Using until July 13th. (6 week treatment) 01/24/21  Yes [provider]  cyanocobalamin (,VITAMIN B-12,) 1000 MCG/ML injection Inject 1 mL into the muscle once a week. 01/11/21  Yes [provider]  diclofenac Sodium (VOLTAREN) 1 % GEL Apply 1 g topically as needed for pain. 09/21/20  Yes [provider]  fexofenadine (ALLEGRA) 180 MG tablet Take 180 mg by mouth as needed for allergies.   Yes [provider]  furosemide (LASIX) 40 MG tablet Take 40 mg by mouth as needed (swelling).   Yes [provider]  gabapentin (NEURONTIN) 300 MG capsule Take 300 mg by mouth 3 (three) times daily. 10/18/20  Yes [provider]  Glucagon, rDNA, (GLUCAGON EMERGENCY) 1 MG KIT Inject 1 mg into the skin as directed. 01/15/21  Yes [provider]  guanFACINE (TENEX) 1 MG tablet Take 1 mg by mouth at bedtime. 12/06/20  Yes [provider]  insulin lispro (HUMALOG) 100 UNIT/ML injection Inject 40 Units into the skin continuous.  Via pump.   Yes [provider]  labetalol (NORMODYNE) 100 MG tablet Take 100 mg by mouth daily as needed (TAKE ONE TABLET EVERY DAY ONLY AS NEEDED FOR SYSTOLIC BP >497). 0/26/37  Yes [provider]  omeprazole (PRILOSEC) 20 MG capsule Take 20 mg by mouth as needed (acid reflux).   Yes [provider]  ondansetron (ZOFRAN) 4 MG tablet Take 4 mg by mouth every 6 (six) hours as needed for nausea/vomiting. 01/17/21  Yes [provider]  sulfaSALAzine (AZULFIDINE) 500 MG tablet Take 1,000 mg by mouth 2 (two) times daily.   Yes [provider]  SYNTHROID 100 MCG tablet Take 100 mcg by mouth every morning. 12/06/20  Yes [provider]  traMADol (ULTRAM) 50 MG tablet Take 50 mg by mouth as needed for pain. 11/06/20  Yes [provider]  oxyCODONE (OXY IR/ROXICODONE) 5 MG immediate release tablet Take 5 mg by mouth every 4 (four) hours as needed for pain. Patient not taking: Reported on 01/25/2021 01/11/21   [provider]     Vitals:   01/26/21 0544 01/26/21 0900 01/26/21 1019 01/26/21 1759  BP: (!) 185/62  (!) 180/63 (!) 164/64  Pulse: 82  82 79  Resp: $Remo'17  17 17  'tQrWA$ Temp: 98.3 F (36.8 C)  98.5 F (36.9 C) 98.4 F (36.9 C)  TempSrc: Axillary  Oral   SpO2: 98%  94% 95%  Weight:  54 kg    Height:  $Remove'4\' 11"'[id number]$  (1.499 m)     Exam Gen no distress, not responding, I am told she is blind but still not responding to verbal commands and nonverbal at this time No rash, cyanosis or gangrene Sclera anicteric, throat clear  No jvd or bruits Chest clear bilat to bases, no rales/ wheezing RRR no MRG Abd soft ntnd no mass or ascites +bs GU foley cath draining light yellow clear urine MS no joint effusions or deformity Ext no LE or UE edema, no wounds or ulcers Neuro is generally quite weak, not following commands       Home meds:  - norvasc 10/ coreg 25 bid/ lasix 40 qd/ labetalol 100 qd prn  - oxy IR prn / tramadol prn/ neurontin 300  tid  - lipitor 5 qd  - synthroid 100 ug qd  - prilosec 20 prn/ sulfasalazine 1gm bid/ zofran prn  - fosamax 70 weekly   - prn's/ vitamins/ supplements     Date   Creat  eGFR   July 2021  1.4  37 ml/min from CE   Nov 2021  1.2  45 ml/min, stage IIIB   01/25/21  4.21  11 ml/min     6/18/ 22  4.77  9 ml/min         Na 132 K 3.3  CO2 22  BUN 54  Cr 4.77  Ca 7.6  Alb 1.7  WBC 12K Hb 9.2   TSH 33.4      BP 160- 185/ 60-70  HR 70- 90  RR 15-20  temp afeb             UOP 180 yest and 300 cc so far today        Got 20 hrs of isotonic sod bicarb gtt at 75 cc/hr yesterday, no other IVF"s and no bolus given        Renal US 6/17 - .8.9 cm and 7.7 cm kidneys (R and L) w/ echogenic cortex, no hydronephrosis. Bladder decompressed by foley catheter      UA 6/17 - cloudy, no bact, 0-5 rbc, > 50 wbc's, prot 100, 5 ketone         Assessment/ Plan: AKI on CKD 3B - baseline creat from 2021 is 1.2- 1.4, eGFR 37- 45 ml/min.  Creat here 4.2 on admission and up to 4.7 today in setting of suspected UTI possible sepsis. Foley in place.  No vol excess on exam, pt unable to provide any history.  Don't see any signs of GN (sig hematuria/ proteinuria) or myeloma (low Hb, high Ca). Suspect intravasc vol depletion. Will bolus 1.5 L and ^ bicarb gtt to 100 cc/hr. Altogether pt is very frail and confused. Possibly due to uremia. Get urine lytes. Will follow.  Recent lumbar fusion surgery - complicated by postop infection rx'd w/ washout and IV abx for 4 wks (+pseudomonas, rx'd zyvox and cefepime) HTN - BP's on the higher side DM 1 - very long hx  AMS - possibly some component of uremia, not sure baseline      Kelly Splinter  MD 01/26/2021, 6:51 PM  Recent Labs  Lab 01/25/21 0400 01/26/21 0318  WBC 9.7 12.3*  HGB 9.2* 9.2*   Recent Labs  Lab 01/25/21 0400 01/25/21 1658 01/26/21 0318  K 3.3* 3.3* 3.3*  BUN 51* 53* 54*  CREATININE 4.21* 4.49* 4.77*  CALCIUM 8.1* 7.9* 7.6*  PHOS 3.6  --  3.2

## 2021-01-26 NOTE — Progress Notes (Signed)
Pharmacy Antibiotic Note  Ariana Wilkinson is a 70 y.o. female admitted on 01/24/2021 with post-op infection/abscess (growing pseudomonas per MD note).  Pharmacy has been consulted for Cefepime dosing.   The patient's renal function continues to worsen with SCr up to 4.77, estimated CrCl<10 ml/min. Will reduce the Cefepime dose today.   Plan: Reduce Cefepime to 1g IV q24h Zyvox per MD Trend WBC, temp, renal function  F/U infectious work-up  Height: 4\' 11"  (149.9 cm) Weight: 54 kg (119 lb 0.8 oz) IBW/kg (Calculated) : 43.2  Temp (24hrs), Avg:98.4 F (36.9 C), Min:98 F (36.7 C), Max:98.6 F (37 C)  Recent Labs  Lab 01/25/21 0400 01/25/21 1658 01/26/21 0318  WBC 9.7  --  12.3*  CREATININE 4.21* 4.49* 4.77*     Estimated Creatinine Clearance: 8.3 mL/min (A) (by C-G formula based on SCr of 4.77 mg/dL (H)).    Allergies  Allergen Reactions   Ace Inhibitors Cough   Diazepam Anxiety   Metoclopramide Other (See Comments)    Can not function   Naproxen Swelling   Nitrofurantoin Shortness Of Breath and Other (See Comments)    Hallucination   Olmesartan Other (See Comments)    Hyperkalemia; Ran up her potassium     Alendronate Sodium Other (See Comments)    acid reflux   Erythromycin Base Other (See Comments)   Hydrochlorothiazide Other (See Comments)    Was hurting in her chest and throwing up   Povidone-Iodine Itching    Thank you for allowing pharmacy to be a part of this patient's care.  01/28/21, PharmD, BCPS Clinical Pharmacist 01/26/2021 11:56 AM   **Pharmacist phone directory can now be found on [email or web address] ([email or web address]).  Listed under [facility].

## 2021-01-27 LAB — CBC WITH DIFFERENTIAL/PLATELET
Abs Immature Granulocytes: 0.08 10*3/uL — ABNORMAL HIGH (ref 0.00–0.07)
Basophils Absolute: 0.2 10*3/uL — ABNORMAL HIGH (ref 0.0–0.1)
Basophils Relative: 1 %
Eosinophils Absolute: 0.1 10*3/uL (ref 0.0–0.5)
Eosinophils Relative: 1 %
HCT: 29.5 % — ABNORMAL LOW (ref 36.0–46.0)
Hemoglobin: 9.5 g/dL — ABNORMAL LOW (ref 12.0–15.0)
Immature Granulocytes: 1 %
Lymphocytes Relative: 9 %
Lymphs Abs: 1.3 10*3/uL (ref 0.7–4.0)
MCH: 31.5 pg (ref 26.0–34.0)
MCHC: 32.2 g/dL (ref 30.0–36.0)
MCV: 97.7 fL (ref 80.0–100.0)
Monocytes Absolute: 1.5 10*3/uL — ABNORMAL HIGH (ref 0.1–1.0)
Monocytes Relative: 11 %
Neutro Abs: 10.9 10*3/uL — ABNORMAL HIGH (ref 1.7–7.7)
Neutrophils Relative %: 77 %
Platelets: 311 10*3/uL (ref 150–400)
RBC: 3.02 MIL/uL — ABNORMAL LOW (ref 3.87–5.11)
RDW: 14 % (ref 11.5–15.5)
WBC: 14.1 10*3/uL — ABNORMAL HIGH (ref 4.0–10.5)
nRBC: 0 % (ref 0.0–0.2)

## 2021-01-27 LAB — COMPREHENSIVE METABOLIC PANEL
ALT: 7 U/L (ref 0–44)
AST: 16 U/L (ref 15–41)
Albumin: 1.6 g/dL — ABNORMAL LOW (ref 3.5–5.0)
Alkaline Phosphatase: 74 U/L (ref 38–126)
Anion gap: 13 (ref 5–15)
BUN: 50 mg/dL — ABNORMAL HIGH (ref 8–23)
CO2: 24 mmol/L (ref 22–32)
Calcium: 7.1 mg/dL — ABNORMAL LOW (ref 8.9–10.3)
Chloride: 94 mmol/L — ABNORMAL LOW (ref 98–111)
Creatinine, Ser: 4.8 mg/dL — ABNORMAL HIGH (ref 0.44–1.00)
GFR, Estimated: 9 mL/min — ABNORMAL LOW (ref >60)
Glucose, Bld: 187 mg/dL — ABNORMAL HIGH (ref 70–99)
Potassium: 3.4 mmol/L — ABNORMAL LOW (ref 3.5–5.1)
Sodium: 131 mmol/L — ABNORMAL LOW (ref 135–145)
Total Bilirubin: 0.1 mg/dL — ABNORMAL LOW (ref 0.3–1.2)
Total Protein: 5.1 g/dL — ABNORMAL LOW (ref 6.5–8.1)

## 2021-01-27 LAB — BASIC METABOLIC PANEL
Anion gap: 9 (ref 5–15)
BUN: 45 mg/dL — ABNORMAL HIGH (ref 8–23)
CO2: 23 mmol/L (ref 22–32)
Calcium: 6.4 mg/dL — CL (ref 8.9–10.3)
Chloride: 98 mmol/L (ref 98–111)
Creatinine, Ser: 4.44 mg/dL — ABNORMAL HIGH (ref 0.44–1.00)
GFR, Estimated: 10 mL/min — ABNORMAL LOW (ref >60)
Glucose, Bld: 167 mg/dL — ABNORMAL HIGH (ref 70–99)
Potassium: 3.1 mmol/L — ABNORMAL LOW (ref 3.5–5.1)
Sodium: 130 mmol/L — ABNORMAL LOW (ref 135–145)

## 2021-01-27 LAB — PROTEIN / CREATININE RATIO, URINE
Creatinine, Urine: 44.28 mg/dL
Protein Creatinine Ratio: 2.33 mg/mg{Cre} — ABNORMAL HIGH (ref 0.00–0.15)
Total Protein, Urine: 103 mg/dL

## 2021-01-27 LAB — GLUCOSE, CAPILLARY
Glucose-Capillary: 182 mg/dL — ABNORMAL HIGH (ref 70–99)
Glucose-Capillary: 130 mg/dL — ABNORMAL HIGH (ref 70–99)
Glucose-Capillary: 191 mg/dL — ABNORMAL HIGH (ref 70–99)
Glucose-Capillary: 54 mg/dL — ABNORMAL LOW (ref 70–99)
Glucose-Capillary: 164 mg/dL — ABNORMAL HIGH (ref 70–99)
Glucose-Capillary: 112 mg/dL — ABNORMAL HIGH (ref 70–99)

## 2021-01-27 LAB — MAGNESIUM: Magnesium: 1.9 mg/dL (ref 1.7–2.4)

## 2021-01-27 LAB — URINE CULTURE: Culture: NO GROWTH

## 2021-01-27 LAB — PHOSPHORUS: Phosphorus: 2.6 mg/dL (ref 2.5–4.6)

## 2021-01-27 LAB — PROCALCITONIN: Procalcitonin: 6.67 ng/mL

## 2021-01-27 MED ORDER — SODIUM CHLORIDE 0.9 % IV SOLN: INTRAVENOUS | Status: DC

## 2021-01-27 MED ORDER — DEXTROSE 50 % IV SOLN
INTRAVENOUS | Status: AC
  Administered 2021-01-27: 50 mL
  Filled 2021-01-27: qty 50

## 2021-01-27 MED ORDER — POTASSIUM CHLORIDE 10 MEQ/100ML IV SOLN
10.0000 meq | INTRAVENOUS | Status: AC
Start: 2021-01-27 — End: 2021-01-28
  Administered 2021-01-27 – 2021-01-28 (×6): 10 meq via INTRAVENOUS
  Filled 2021-01-27 (×6): qty 100

## 2021-01-27 MED ORDER — SODIUM CHLORIDE 0.9 % IV BOLUS
1000.0000 mL | Freq: Once | INTRAVENOUS | Status: AC
  Administered 2021-01-27: 1000 mL via INTRAVENOUS

## 2021-01-27 MED ORDER — SODIUM CHLORIDE 0.9 % IV BOLUS: 500.0000 mL | Freq: Once | INTRAVENOUS | Status: DC

## 2021-01-27 MED ORDER — CALCIUM GLUCONATE-NACL 2-0.675 GM/100ML-% IV SOLN
2.0000 g | Freq: Once | INTRAVENOUS | Status: AC
  Administered 2021-01-27: 2000 mg via INTRAVENOUS
  Filled 2021-01-27: qty 100

## 2021-01-27 NOTE — Progress Notes (Signed)
Hypoglycemic Event  CBG: 54 at 1240  Treatment: D50 50 mL (25 gm)  Symptoms: None  Follow-up CBG: Time: 1300  CBG Result:191  Possible Reasons for Event: Inadequate meal intake  Comments/MD notified: Dr. Joseph Art made aware.    Myrtis Hopping

## 2021-01-27 NOTE — Progress Notes (Signed)
[facility] Progress Note  Subjective: seen in room, UOP up to 800 cc yest, creat down 4.8 this am and 4.4 this afternoon. Pt a little more interactive according to husband.   Vitals:   01/26/21 1759 01/26/21 1959 01/27/21 0357 01/27/21 1152  BP: (!) 164/64 (!) 168/58  (!) 176/62  Pulse: 79 87  89  Resp: $Remo'17 17  16  'UUEve$ Temp: 98.4 F (36.9 C) 97.9 F (36.6 C)  98 F (36.7 C)  TempSrc:  Oral  Oral  SpO2: 95% 93%  95%  Weight:   57.6 kg   Height:        Exam: Gen no distress, a little more receptive to noise, pt is blind No rash, cyanosis or gangrene Sclera anicteric, throat clear No jvd or bruits Chest clear bilat to bases, no rales/ wheezing RRR no MRG Abd soft ntnd no mass or ascites +bs GU foley cath draining light yellow clear urine MS no joint effusions or deformity Ext no LE or UE edema, no wounds or ulcers Neuro is generally quite weak, not following commands          Home meds:  - norvasc 10/ coreg 25 bid/ lasix 40 qd/ labetalol 100 qd prn  - oxy IR prn / tramadol prn/ neurontin 300 tid  - lipitor 5 qd  - synthroid 100 ug qd  - prilosec 20 prn/ sulfasalazine 1gm bid/ zofran prn  - fosamax 70 weekly   - prn's/ vitamins/ supplements      Date                          Creat               eGFR   July 2021                   1.4                   37 ml/min from CE   Nov 2021                   1.2                   45 ml/min, stage IIIB   01/25/21                      4.21                 11 ml/min                       6/18/ 22                     4.77                 9 ml/min                                    WBC 12K Hb 9.2   TSH 33.4        Renal US 6/17 - .8.9 cm and 7.7 cm kidneys (R and L) w/ echogenic cortex, no hydronephrosis. Bladder decompressed by foley catheter      UA 6/17 - cloudy, no bact, 0-5 rbc, > 50 wbc's, prot 100, 5 ketone  Assessment/ Plan: AKI on CKD 3B - baseline creat from 2021 is 1.2- 1.4, eGFR 37- 45 ml/min.   Creat here 4.2 on admission and up to 4.7 today in setting of suspected UTI possible sepsis. Foley in place.  No vol excess on exam, pt unable to provide any history.  Don't see any signs of GN (sig hematuria/ proteinuria) or myeloma (low Hb, high Ca). Suspected intravasc vol depletion, got bolus 1.5 L and IVF"s ^'d overnight. Creat down to 4.8 this am and 4.4 this afternoon. MS improving slightly per the husband. Will cont IVF's at current rate. Get labs in am. Will follow.  Recent lumbar fusion surgery - at OSH, complicated by postop infection rx'd w/ washout and IV abx for 4 wks (+pseudomonas, rx'd zyvox and cefepime) HTN - BP's on the higher side DM 1 - very long hx Hypoalbuminemia - not sure cause, check UPC ratio AMS - possibly some component of uremia, improving some       Ariana Wilkinson 01/27/2021, 6:22 PM   Recent Labs  Lab 01/26/21 0318 01/26/21 2038 01/27/21 0316 01/27/21 1726  K 3.3*   < > 3.4* 3.1*  BUN 54*   < > 50* 45*  CREATININE 4.77*   < > 4.80* 4.44*  CALCIUM 7.6*   < > 7.1* 6.4*  PHOS 3.2  --  2.6  --   HGB 9.2*  --  9.5*  --    < > = values in this interval not displayed.   Inpatient medications:  Chlorhexidine Gluconate Cloth  6 each Topical Daily   heparin  5,000 Units Subcutaneous Q8H   insulin aspart  0-15 Units Subcutaneous Q4H   insulin detemir  12 Units Subcutaneous QHS   levothyroxine  75 mcg Intravenous Daily   metoprolol tartrate  5 mg Intravenous TID    sodium chloride 125 mL/hr at 01/27/21 1221   ceFEPime (MAXIPIME) IV 1 g (01/27/21 1714)   linezolid (ZYVOX) IV 600 mg (01/27/21 1230)   acetaminophen, hydrALAZINE, HYDROmorphone (DILAUDID) injection, ondansetron (ZOFRAN) IV, oxyCODONE, senna-docusate, sodium chloride flush

## 2021-01-27 NOTE — Progress Notes (Signed)
This RN received notification from lab of critical result: Calcium 6.4  MD made aware, will continue to monitor.  Jean Rosenthal, RN

## 2021-01-27 NOTE — Progress Notes (Signed)
PROGRESS NOTE    Ariana Wilkinson  ZOX:[id number] DOB: 01/31/1951 DOA: 01/24/2021 PCP: [name], MD     Brief Narrative:  70 y.o. WF PMHx  critical blindness, DM type 1 diabetes on insulin pump, s/p Lumbar fusion surgery at L5-S1 on 12/26/2020 by Dr. [name], orthopedic surgery, readmitted for post infection/abscess with washout surgery on 01/11/2021, infection site growing Pseudomonas sensitive to cefepime, discharged on 01/16/2021 to complete 4 weeks of IV antibiotics,   Presents to [facility] health ED on 01/23/2021 for acute confusion of less than a day duration and AKI.  Creatinine elevated 4.2 up from a baseline of 1.0, BUN of 53 up from 12.  CT abdomen pelvis showing post renal obstruction from distended bladder and chronic aorto iliac, mesenteric artery calcifications.  Foley catheter was placed on 01/24/2021.  Patient was accepted as a direct admission from [facility] health ED to [facility] 57M unit by Dr. Blake Divine, [facility].  Majority of the history is obtained from patient's husband via phone and Dr. Blake Divine admission progress note.  At the time of this visit the patient is obtunded and not able to provide a history.  She had received a dose of IV Haldol in the ED at [facility] Per review of medical record on paper chart.   ED Course:  Temperature 98.6, BP 195/77, pulse 93, respiratory 20, O2 saturation 94% on room air.  Lab studies remarkable for serum sodium 133, potassium 4.3, chloride 105, serum bicarb 15, BUN 53, creatinine 4.20 with GFR of 10, serum glucose 76.  Magnesium 1.9.  proBNP 3600, WBC 13.2, hemoglobin 10.5, creatinine 32.4.  Platelet count 311.  Troponin less than 0.01.  VBG pH 7.22, PCO2 42.  Lactic acid 1.3.  Procalcitonin 4.0.  While in the ED patient received IV cefepime, isotonic bicarb, linezolid.  COVID-19 screening test negative, influenza AMB negative.  Foley catheter insertion on 01/24/2021 at [facility] health ED.  EKG showing normal sinus rhythm rate of 88, nonspecific ST-T  changes.  QTc 404.   Subjective: 6/19 afebrile overnight, patient's BP has trended up overnight.    Assessment & Plan: Covid vaccination;   Active Problems:   AKI (acute kidney injury) (HCC)   Acute urinary retention   Metabolic acidosis, normal anion gap (NAG)   Acute metabolic encephalopathy    AKI, (baseline Cr 1.0 with GFR>) -multifactorial secondary to prerenal from poor oral intake, post renal from urinary retention. Lab Results  Component Value Date   CREATININE 4.80 (H) 01/27/2021   CREATININE 5.05 (H) 01/26/2021   CREATININE 4.77 (H) 01/26/2021   CREATININE 4.49 (H) 01/25/2021   CREATININE 4.21 (H) 01/25/2021  - 6/17 Sodium bicarb-D5W 59ml/hr discontinued 6/18 per nephrology recommendation -6/17 sodium bicarb 1 amp -6/17 UPEP/SPEP pending -6/17 renal ultrasound pending - strict in and out -453 ml - Daily weight Filed Weights   01/26/21 0900 01/27/21 0357  Weight: 54 kg 57.6 kg  -6/17 BMP 0500/1700 -Avoid nephrotoxic agent, dehydration and hypotension. - 6/18 discussed case with Dr. Delano Metz nephrology who will see patient.  Await recommendations -6/19 normal saline 125 ml/hr per nephrology recommendation  Acute urinary retention -6/16 bladder distention: S/p  Foley catheter insertion  in the ED at [facility]   Non anion gap metabolic acidosis in the setting of AKI. -Presented with serum bicarb of 15 with VBG pH of 7.22 -See AKI  -6/19 resolved  Acute metabolic encephalopathy, likely secondary to acute illness -Per husband via phone confusion started acutely the night prior to her  presentation to the ED on 01/23/2021. -6/16 UDS negative, at [facility] -Received dose of Haldol 2 mg on 6/16 at 1203 at [facility] -6/17 may have received multiple doses of narcotics prior to arrival.  We will try to ascertain -6/17 review of hardcopy chart from [facility] does not appear that patient had CT or MRI brain. -6/17 MRI brain pending  R/O CVA    Type 1 diabetes with hypoglycemia -On insulin pump (hold) - 6/18 increase Levemir 12 units daily - 6/17 moderate SSI    Status post lumbar fusion surgery at L5-S1 on 12/26/2020, postop infection/abscess with washout surgery on 01/11/2021 -Infection site growing Pseudomonas sensitive to cefepime, continue -Continue linezolid -She was discharged on 01/16/2021 to complete 4 weeks of IV antibiotics -Blood cultures pending  -Per husband she was scheduled to follow-up with infectious disease outpatient on 02/05/2021.   Essential hypertension -BP not at goal, elevated - 6/18 change Metoprolol IV 5 MG TID -6/18 Hydralazine IV SBP> 150 or DBP> 100 -Resume home oral antihypertensives when no longer NPO -Monitor vital signs closely   Hypothyroidism -6/17 TSH= 33.4 -Switch p.o. Synthroid to Synthroid IV 37.5 mcg daily  -6/17 ADDENDUM: Increased Synthroid IV 75 mcg daily  Hypokalemia - Potassium goal> 4 - 6/19 potassium IV 40 mEq -6/19 potassium IV 60 mEq: Secondary to repeat low potassium  Hypomagnesmia - Magnesium goal> 2  Hypocalcemia - 6/19 corrected calcium= 9.0 -6/19 repeat corrected calcium= 8.3 - 6/19 calcium gluconate IV 2 g    Chronic aorto iliac, mesenteric artery calcifications -Resume home Lipitor when no longer NPO       DVT prophylaxis: Subcu heparin Code Status: Full Family Communication: 6/19 husband and son at bedside discussed plan of care answered all questions Status is: Inpatient    Dispo: The patient is from: Home              Anticipated d/c is to: Home              Anticipated d/c date is: 6/25              Patient currently unstable      Consultants:  Nephrology   Procedures/Significant Events:  6/17 MRI brain:No acute intracranial abnormality. 2. Generalized volume loss without a clear lobar predilection 6/17 MRI L-spine Interval postoperative changes of L5-S1 posterior and interbody fusion. No appreciable epidural fluid  collection, although evaluation is somewhat degraded by motion artifact and lack of postcontrast imaging. 2. Multilevel degenerative changes of the lumbar spine are similar to the prior study. There is moderate canal stenosis at L3-4 and L4-5 and mild canal stenosis at L1-2 and L2-3. 6/17 US renal:Echogenic kidneys consistent with medical renal disease. No hydronephrosis 2. Small free fluid in the right lower quadrant. Trace right pleural effusion.   I have personally reviewed and interpreted all radiology studies and my findings are as above.  VENTILATOR SETTINGS: Room air 6/19 SPO2 94%   Cultures 6/16 SARS coronavirus negative 6/16 influenza A/B negative 6/17 blood pending 6/17 urine pending   Antimicrobials: Anti-infectives (From admission, onward)    Start     Ordered Stop   01/26/21 1245  ceFEPIme (MAXIPIME) 1 g in sodium chloride 0.9 % 100 mL IVPB        01/26/21 1152     01/25/21 1000  ceFEPIme (MAXIPIME) 2 g in sodium chloride 0.9 % 100 mL IVPB  Status:  Discontinued        01/25/21 0527 01/26/21 1152   01/25/21  0100  linezolid (ZYVOX) IVPB 600 mg        01/24/21 2345           Devices    LINES / TUBES:      Continuous Infusions:  sodium chloride 125 mL/hr at 01/27/21 1221   ceFEPime (MAXIPIME) IV 1 g (01/26/21 1301)   linezolid (ZYVOX) IV 600 mg (01/27/21 1230)     Objective: Vitals:   01/26/21 1759 01/26/21 1959 01/27/21 0357 01/27/21 1152  BP: (!) 164/64 (!) 168/58  (!) 176/62  Pulse: 79 87  89  Resp: 17 17  16   Temp: 98.4 F (36.9 C) 97.9 F (36.6 C)  98 F (36.7 C)  TempSrc:  Oral  Oral  SpO2: 95% 93%  95%  Weight:   57.6 kg   Height:        Intake/Output Summary (Last 24 hours) at 01/27/2021 1425 Last data filed at 01/27/2021 0600 Gross per 24 hour  Intake 400 ml  Output 800 ml  Net -400 ml    Filed Weights   01/26/21 0900 01/27/21 0357  Weight: 54 kg 57.6 kg   Physical Exam:  General: Somnolent but much more alert  today, will answer simple questions.  A/O x1 (does not know where, when, why) No acute respiratory distress Eyes: negative scleral hemorrhage, negative anisocoria, negative icterus ENT: Negative Runny nose, negative gingival bleeding, Neck:  Negative scars, masses, torticollis, lymphadenopathy, JVD Lungs: Clear to auscultation bilaterally without wheezes or crackles Cardiovascular: Regular rate and rhythm without murmur gallop or rub normal S1 and S2 Abdomen: negative abdominal pain, nondistended, positive soft, bowel sounds, no rebound, no ascites, no appreciable mass Extremities: No significant cyanosis, clubbing, or edema bilateral lower extremities Skin: Negative rashes, lesions, ulcers Psychiatric: Unable to assess secondary to altered mental status Central nervous system:  Cranial nerves II through XII intact, tongue/uvula midline, all extremities muscle strength 5/5, sensation intact throughout, f negative dysarthria, negative expressive aphasia, negative receptive aphasia.  Follows most commands      Data Reviewed: Care during the described time interval was provided by me .  I have reviewed this patient's available data, including medical history, events of note, physical examination, and all test results as part of my evaluation.  CBC: Recent Labs  Lab 01/25/21 0400 01/26/21 0318 01/27/21 0316  WBC 9.7 12.3* 14.1*  NEUTROABS  --  9.8* 10.9*  HGB 9.2* 9.2* 9.5*  HCT 28.5* 28.2* 29.5*  MCV 99.0 97.9 97.7  PLT 293 297 311    Basic Metabolic Panel: Recent Labs  Lab 01/25/21 0400 01/25/21 1658 01/26/21 0318 01/26/21 2038 01/27/21 0316  NA 131* 132* 132* 129* 131*  K 3.3* 3.3* 3.3* 3.0* 3.4*  CL 100 100 98 94* 94*  CO2 17* 18* 22 22 24   GLUCOSE 222* 258* 242* 258* 187*  BUN 51* 53* 54* 53* 50*  CREATININE 4.21* 4.49* 4.77* 5.05* 4.80*  CALCIUM 8.1* 7.9* 7.6* 7.2* 7.1*  MG 1.7  --  1.6*  --  1.9  PHOS 3.6  --  3.2  --  2.6    GFR: Estimated Creatinine Clearance:  8.6 mL/min (A) (by C-G formula based on SCr of 4.8 mg/dL (H)). Liver Function Tests: Recent Labs  Lab 01/26/21 0318 01/27/21 0316  AST 12* 16  ALT 8 7  ALKPHOS 76 74  BILITOT 0.4 0.1*  PROT 5.2* 5.1*  ALBUMIN 1.7* 1.6*    No results for input(s): LIPASE, AMYLASE in the last 168 hours. Recent Labs  Lab 01/25/21 1658  AMMONIA 34    Coagulation Profile: No results for input(s): INR, PROTIME in the last 168 hours. Cardiac Enzymes: No results for input(s): CKTOTAL, CKMB, CKMBINDEX, TROPONINI in the last 168 hours. BNP (last 3 results) No results for input(s): PROBNP in the last 8760 hours. HbA1C: No results for input(s): HGBA1C in the last 72 hours. CBG: Recent Labs  Lab 01/26/21 2339 01/27/21 0453 01/27/21 0652 01/27/21 1239 01/27/21 1312  GLUCAP 284* 182* 130* 54* 191*    Lipid Profile: Recent Labs    01/25/21 1658 01/26/21 0318  CHOL 159 160  HDL 40* 39*  LDLCALC 95 99  TRIG 118 110  CHOLHDL 4.0 4.1    Thyroid Function Tests: Recent Labs    01/25/21 1658  TSH 33.412*    Anemia Panel: No results for input(s): VITAMINB12, FOLATE, FERRITIN, TIBC, IRON, RETICCTPCT in the last 72 hours. Sepsis Labs: Recent Labs  Lab 01/25/21 0400 01/26/21 0318 01/27/21 0316  PROCALCITON 7.07 8.36 6.67     Recent Results (from the past 240 hour(s))  Culture, Urine     Status: None   Collection Time: 01/25/21  4:02 PM   Specimen: Urine, Random  Result Value Ref Range Status   Specimen Description URINE, RANDOM  Final   Special Requests NONE  Final   Culture   Final    NO GROWTH Performed at [facility], [address] [place], [place] [address]    Report Status 01/27/2021 FINAL  Final         Radiology Studies: MR BRAIN WO CONTRAST  Result Date: 01/25/2021 CLINICAL DATA:  Encephalopathy.  Possible stroke. EXAM: MRI HEAD WITHOUT CONTRAST TECHNIQUE: Multiplanar, multiecho pulse sequences of the brain and surrounding structures were obtained  without intravenous contrast. COMPARISON:  None. FINDINGS: Brain: No acute infarct, mass effect or extra-axial collection. No acute or chronic hemorrhage. Normal white matter signal. Generalized volume loss without a clear lobar predilection. The midline structures are normal. Vascular: Major flow voids are preserved. Skull and upper cervical spine: Normal calvarium and skull base. Visualized upper cervical spine and soft tissues are normal. Sinuses/Orbits:Binocular volume loss and sequelae of hemorrhage. Sinuses are clear. IMPRESSION: 1. No acute intracranial abnormality. 2. Generalized volume loss without a clear lobar predilection. Electronically Signed   By: Deatra Robinson M.D.   On: 01/25/2021 20:19   MR LUMBAR SPINE WO CONTRAST  Result Date: 01/25/2021 CLINICAL DATA:  Encephalopathy. History of L5-S1 spinal fusion on 12/26/2020 with subsequent infection and washout on 01/11/2021 EXAM: MRI LUMBAR SPINE WITHOUT CONTRAST TECHNIQUE: Multiplanar, multisequence MR imaging of the lumbar spine was performed. No intravenous contrast was administered. COMPARISON:  MRI 12/24/2020 FINDINGS: Technical note: Despite efforts by the technologist and patient, motion artifact is present on today's exam and could not be eliminated. Artifact primarily affects the axial T2 weighted sequence. This reduces exam sensitivity and specificity. Segmentation: Standard. Alignment:  Mild dextrocurvature.  No significant listhesis. Vertebrae: Interval postoperative changes of L5-S1 posterior and interbody fusion. No fracture. No evidence to suggest discitis. No suspicious bone lesion. Multilevel discogenic endplate marrow changes. Conus medullaris and cauda equina: Conus extends to the L1-2 level. There is bunching of the cauda equina nerve roots above the L3-4 level of stenosis. No appreciable epidural fluid collection, although evaluation is somewhat degraded by motion artifact and lack of postcontrast imaging. Paraspinal and other soft  tissues: Interval postoperative changes within the lower back. No postoperative fluid collections. Disc levels: T12-L1: Mild diffuse disc bulge without  foraminal or canal stenosis. Unchanged. L1-L2: Mild diffuse disc bulge with small biforaminal protrusions. Previously seen right subarticular disc herniation is no longer evident. There is mild canal stenosis and mild bilateral foraminal recess stenosis which are unchanged. L2-L3: Diffuse disc osteophyte complex resulting in mild canal stenosis and mild bilateral foraminal stenosis. Unchanged. L3-L4: Diffuse disc osteophyte complex with mild bilateral facet arthropathy and prominence of the epidural fat. Findings result in moderate canal stenosis with mild bilateral foraminal stenosis. Unchanged. L4-L5: Diffuse disc bulge with mild bilateral facet arthropathy and ligamentum flavum buckling. Prominence of the epidural fat. Findings result in moderate canal stenosis with mild left foraminal stenosis. Unchanged. L5-S1: Interval posterior and interbody fusion. Left paracentral disc protrusion and bilateral facet hypertrophy. Suspect mild bilateral foraminal stenosis. No significant canal stenosis. The degree of foraminal narrowing on the left may be slightly improved from prior. IMPRESSION: 1. Interval postoperative changes of L5-S1 posterior and interbody fusion. No appreciable epidural fluid collection, although evaluation is somewhat degraded by motion artifact and lack of postcontrast imaging. 2. Multilevel degenerative changes of the lumbar spine are similar to the prior study. There is moderate canal stenosis at L3-4 and L4-5 and mild canal stenosis at L1-2 and L2-3. Electronically Signed   By: Duanne Guess D.O.   On: 01/25/2021 20:26   US RENAL  Result Date: 01/25/2021 CLINICAL DATA:  Acute renal failure EXAM: RENAL / URINARY TRACT ULTRASOUND COMPLETE COMPARISON:  01/24/2021 FINDINGS: Right Kidney: Renal measurements: 8.9 x 3.5 x 4.8 cm = volume: 176 mL.  Cortex is echogenic. No mass or hydronephrosis. Trace perinephric fluid. Left Kidney: Renal measurements: 7.7 x 4 x 4.1 cm = volume: 66.5 mL. Cortex is echogenic. No mass or hydronephrosis. Bladder: Decompressed by Foley catheter. Other: Small free fluid in the right lower quadrant. Trace right pleural effusion IMPRESSION: 1. Echogenic kidneys consistent with medical renal disease. No hydronephrosis 2. Small free fluid in the right lower quadrant. Trace right pleural effusion. Electronically Signed   By: Jasmine Pang M.D.   On: 01/25/2021 17:20        Scheduled Meds:  Chlorhexidine Gluconate Cloth  6 each Topical Daily   heparin  5,000 Units Subcutaneous Q8H   insulin aspart  0-15 Units Subcutaneous Q4H   insulin detemir  12 Units Subcutaneous QHS   levothyroxine  75 mcg Intravenous Daily   metoprolol tartrate  5 mg Intravenous TID   Continuous Infusions:  sodium chloride 125 mL/hr at 01/27/21 1221   ceFEPime (MAXIPIME) IV 1 g (01/26/21 1301)   linezolid (ZYVOX) IV 600 mg (01/27/21 1230)     LOS: 3 days    Time spent:40 min    Samba Cumba, Roselind Messier, MD [facility]   If 7PM-7AM, please contact night-coverage 01/27/2021, 2:25 PM

## 2021-01-28 LAB — HEMOGLOBIN A1C
Hgb A1c MFr Bld: 4.3 % — ABNORMAL LOW (ref 4.8–5.6)
Hgb A1c MFr Bld: 4.3 % — ABNORMAL LOW (ref 4.8–5.6)
Hgb A1c MFr Bld: <4.2 % — ABNORMAL LOW (ref 4.8–5.6)
Mean Plasma Glucose: 77 mg/dL
Mean Plasma Glucose: 77 mg/dL
Mean Plasma Glucose: <74 mg/dL

## 2021-01-28 LAB — CBC WITH DIFFERENTIAL/PLATELET
Abs Immature Granulocytes: 0.05 10*3/uL (ref 0.00–0.07)
Basophils Absolute: 0.2 10*3/uL — ABNORMAL HIGH (ref 0.0–0.1)
Basophils Relative: 1 %
Eosinophils Absolute: 0.3 10*3/uL (ref 0.0–0.5)
Eosinophils Relative: 2 %
HCT: 29.4 % — ABNORMAL LOW (ref 36.0–46.0)
Hemoglobin: 9.5 g/dL — ABNORMAL LOW (ref 12.0–15.0)
Immature Granulocytes: 0 %
Lymphocytes Relative: 12 %
Lymphs Abs: 1.6 10*3/uL (ref 0.7–4.0)
MCH: 31.7 pg (ref 26.0–34.0)
MCHC: 32.3 g/dL (ref 30.0–36.0)
MCV: 98 fL (ref 80.0–100.0)
Monocytes Absolute: 1.2 10*3/uL — ABNORMAL HIGH (ref 0.1–1.0)
Monocytes Relative: 9 %
Neutro Abs: 10.2 10*3/uL — ABNORMAL HIGH (ref 1.7–7.7)
Neutrophils Relative %: 76 %
Platelets: 275 10*3/uL (ref 150–400)
RBC: 3 MIL/uL — ABNORMAL LOW (ref 3.87–5.11)
RDW: 14.1 % (ref 11.5–15.5)
WBC: 13.4 10*3/uL — ABNORMAL HIGH (ref 4.0–10.5)
nRBC: 0 % (ref 0.0–0.2)

## 2021-01-28 LAB — COMPREHENSIVE METABOLIC PANEL
ALT: 8 U/L (ref 0–44)
AST: 15 U/L (ref 15–41)
Albumin: 1.5 g/dL — ABNORMAL LOW (ref 3.5–5.0)
Alkaline Phosphatase: 76 U/L (ref 38–126)
Anion gap: 11 (ref 5–15)
BUN: 44 mg/dL — ABNORMAL HIGH (ref 8–23)
CO2: 22 mmol/L (ref 22–32)
Calcium: 7.4 mg/dL — ABNORMAL LOW (ref 8.9–10.3)
Chloride: 98 mmol/L (ref 98–111)
Creatinine, Ser: 4.44 mg/dL — ABNORMAL HIGH (ref 0.44–1.00)
GFR, Estimated: 10 mL/min — ABNORMAL LOW (ref >60)
Glucose, Bld: 116 mg/dL — ABNORMAL HIGH (ref 70–99)
Potassium: 4 mmol/L (ref 3.5–5.1)
Sodium: 131 mmol/L — ABNORMAL LOW (ref 135–145)
Total Bilirubin: 0.4 mg/dL (ref 0.3–1.2)
Total Protein: 5.1 g/dL — ABNORMAL LOW (ref 6.5–8.1)

## 2021-01-28 LAB — SODIUM, URINE, RANDOM: Sodium, Ur: 53 mmol/L

## 2021-01-28 LAB — GLUCOSE, CAPILLARY
Glucose-Capillary: 112 mg/dL — ABNORMAL HIGH (ref 70–99)
Glucose-Capillary: 140 mg/dL — ABNORMAL HIGH (ref 70–99)
Glucose-Capillary: 155 mg/dL — ABNORMAL HIGH (ref 70–99)
Glucose-Capillary: 170 mg/dL — ABNORMAL HIGH (ref 70–99)
Glucose-Capillary: 177 mg/dL — ABNORMAL HIGH (ref 70–99)
Glucose-Capillary: 266 mg/dL — ABNORMAL HIGH (ref 70–99)
Glucose-Capillary: 57 mg/dL — ABNORMAL LOW (ref 70–99)

## 2021-01-28 LAB — PROTEIN ELECTROPHORESIS, SERUM
A/G Ratio: 1.1 (ref 0.7–1.7)
Albumin ELP: 2.7 g/dL — ABNORMAL LOW (ref 2.9–4.4)
Alpha-1-Globulin: 0.3 g/dL (ref 0.0–0.4)
Alpha-2-Globulin: 0.8 g/dL (ref 0.4–1.0)
Beta Globulin: 0.6 g/dL — ABNORMAL LOW (ref 0.7–1.3)
Gamma Globulin: 0.9 g/dL (ref 0.4–1.8)
Globulin, Total: 2.5 g/dL (ref 2.2–3.9)
Total Protein ELP: 5.2 g/dL — ABNORMAL LOW (ref 6.0–8.5)

## 2021-01-28 LAB — PROCALCITONIN: Procalcitonin: 4.61 ng/mL

## 2021-01-28 LAB — PHOSPHORUS: Phosphorus: 3.2 mg/dL (ref 2.5–4.6)

## 2021-01-28 LAB — MAGNESIUM: Magnesium: 1.7 mg/dL (ref 1.7–2.4)

## 2021-01-28 LAB — CREATININE, URINE, RANDOM: Creatinine, Urine: 50.57 mg/dL

## 2021-01-28 MED ORDER — MAGNESIUM SULFATE 2 GM/50ML IV SOLN
2.0000 g | Freq: Once | INTRAVENOUS | Status: AC
  Administered 2021-01-28: 2 g via INTRAVENOUS
  Filled 2021-01-28: qty 50

## 2021-01-28 MED ORDER — HYDRALAZINE HCL 20 MG/ML IJ SOLN
5.0000 mg | Freq: Four times a day (QID) | INTRAMUSCULAR | Status: DC
  Administered 2021-01-28 – 2021-02-02 (×21): 5 mg via INTRAVENOUS
  Filled 2021-01-28 (×21): qty 1

## 2021-01-28 MED ORDER — DEXTROSE 50 % IV SOLN
INTRAVENOUS | Status: AC
  Administered 2021-01-28: 50 mL
  Filled 2021-01-28: qty 50

## 2021-01-28 NOTE — TOC Initial Note (Signed)
Transition of Care [facility]) - Initial/Assessment Note    Patient Details  Name: Ariana Wilkinson MRN: [id number] Date of Birth: 1951-05-08  Transition of Care [facility]) CM/SW Contact:    Bess Kinds, RN Phone Number: [phone] 01/28/2021, 10:46 AM  Clinical Narrative:                  Spoke with patient and spouse at the bedside to discuss transition planning. Demographics verified. PCP is Dr. Tresa Garter in [place]. Does see Dr. Sharl Ma for endocrinology. Currently active with [facility]. Spoke with Marcelino Duster at [facility] who confirmed that patient is active for PT and RN for IV antibiotics. Patient will need [facility] RN and PT orders at discharge in order to resume services. Patient currently has needed DME to include rollator, cane, and 3N1. Preferred pharmacy in Epic verified as correct. Spouse intends to provide transportation home in private vehicle at time of discharge. TOC following for transition needs.   Expected Discharge Plan: Home w Home Health Services Barriers to Discharge: Continued Medical Work up   Patient Goals and CMS Choice Patient states their goals for this hospitalization and ongoing recovery are:: return home with spouse CMS Medicare.gov Compare Post Acute Care list provided to:: Patient Choice offered to / list presented to : Patient, Spouse  Expected Discharge Plan and Services Expected Discharge Plan: Home w Home Health Services In-house Referral: Clinical Social Work Discharge Planning Services: CM Consult Post Acute Care Choice: Home Health Living arrangements for the past 2 months: Single Family Home                 DME Arranged: N/A DME Agency: NA       HH Arranged: RN, PT HH Agency: [facility] Date [facility] Agency Contacted: 01/28/21 Time HH Agency Contacted: 1045 Representative spoke with at [facility] Agency: Marcelino Duster  Prior Living Arrangements/Services Living arrangements for the past 2 months: Single Family  Home Lives with:: Self, Spouse Patient language and need for interpreter reviewed:: Yes Do you feel safe going back to the place where you live?: Yes      Need for Family Participation in Patient Care: Yes (Comment) Care giver support system in place?: Yes (comment) Current home services: DME (walker and bedside commode) Criminal Activity/Legal Involvement Pertinent to Current Situation/Hospitalization: No - Comment as needed  Activities of Daily Living      Permission Sought/Granted Permission sought to share information with : Family Supports Permission granted to share information with : Yes, Verbal Permission Granted  Share Information with NAME: Ariana Wilkinson     Permission granted to share info w Relationship: husband  Permission granted to share info w Contact Information: [phone]  Emotional Assessment Appearance:: Appears stated age Attitude/Demeanor/Rapport: Engaged Affect (typically observed): Accepting Orientation: : Oriented to Self, Oriented to  Time, Oriented to Place, Oriented to Situation Alcohol / Substance Use: Not Applicable Psych Involvement: No (comment)  Admission diagnosis:  AKI (acute kidney injury) (HCC) [N17.9] Patient Active Problem List   Diagnosis Date Noted   Acute urinary retention 01/25/2021   Metabolic acidosis, normal anion gap (NAG) 01/25/2021   Acute metabolic encephalopathy 01/25/2021   AKI (acute kidney injury) (HCC) 01/24/2021   PCP:  [name], MD Pharmacy:   [facility] [place] [place] - [address] [place] [address] Phone: [phone] Fax: [phone]     Social Determinants of Health (  SDOH) Interventions    Readmission Risk Interventions No flowsheet data found.

## 2021-01-28 NOTE — Evaluation (Signed)
Clinical/Bedside Swallow Evaluation Patient Details  Name: Ariana Wilkinson MRN: [id number] Date of Birth: August 02, 1951  Today's Date: 01/28/2021 Time: SLP Start Time (ACUTE ONLY): 1200 SLP Stop Time (ACUTE ONLY): 1216 SLP Time Calculation (min) (ACUTE ONLY): 16.88 min  Past Medical History:  Past Medical History:  Diagnosis Date   Diabetes mellitus without complication (HCC)    Metabolic acidosis, normal anion gap (NAG) 01/25/2021   Past Surgical History:  Past Surgical History:  Procedure Laterality Date   EYE SURGERY     HPI:  Pt is a 70 y.o. female who presented to the ED on 01/23/2021 for acute confusion and AKI. MRI 6/17 negative. CXR on admission: Persistent bandlike opacities, likely subsegmental atelectasis  and/or scarring. Dx acute metabolic encephalopathy. SLP consulted due to improvement in encephalopathy and question of whether a p.o. diet may be initiated. PMH: critical blindness, DM type 1 diabetes on insulin pump, s/p Lumbar fusion surgery at L5-S1 on 12/26/2020, readmitted for post infection/abscess with washout surgery on 01/11/2021, infection site growing Pseudomonas sensitive to cefepime, discharged on 01/16/2021 to complete 4 weeks of IV antibiotics.   Assessment / Plan / Recommendation Clinical Impression  Pt was seen for bedside swallow evaluation with her husband present who denied the pt having a history of oropharyngeal dysphagia. Oral mechanism exam was [facility] and she presented with adequate, natural dentition. She tolerated all solids and liquids without signs or symptoms of oropharyngeal dysphagia. A regular texture diet with thin liquids is recommended at this time and SLP will follow briefly to ensure diet tolerance. SLP Visit Diagnosis: Dysphagia, unspecified (R13.10)    Aspiration Risk  Mild aspiration risk    Diet Recommendation Regular;Thin liquid   Liquid Administration via: Cup;Straw Medication Administration: Whole meds with puree Supervision: Staff to assist  with self feeding Compensations: Minimize environmental distractions;Slow rate Postural Changes: Seated upright at 90 degrees    Other  Recommendations Oral Care Recommendations: Oral care BID   Follow up Recommendations  (TBD)      Frequency and Duration min 1 x/week  1 week       Prognosis Prognosis for Safe Diet Advancement: Good      Swallow Study   General Date of Onset: 01/25/21 HPI: Pt is a 70 y.o. female who presented to the ED on 01/23/2021 for acute confusion and AKI. MRI 6/17 negative. CXR on admission: Persistent bandlike opacities, likely subsegmental atelectasis  and/or scarring. Dx acute metabolic encephalopathy. SLP consulted due to improvement in encephalopathy and question of whether a p.o. diet may be initiated. PMH: critical blindness, DM type 1 diabetes on insulin pump, s/p Lumbar fusion surgery at L5-S1 on 12/26/2020, readmitted for post infection/abscess with washout surgery on 01/11/2021, infection site growing Pseudomonas sensitive to cefepime, discharged on 01/16/2021 to complete 4 weeks of IV antibiotics. Type of Study: Bedside Swallow Evaluation Previous Swallow Assessment: none Diet Prior to this Study: NPO Temperature Spikes Noted: No Respiratory Status: Room air History of Recent Intubation: No Behavior/Cognition: Alert;Cooperative;Confused Oral Cavity Assessment: Within Functional Limits Oral Care Completed by SLP: Recent completion by staff Vision: Functional for self-feeding Self-Feeding Abilities: Total assist Patient Positioning: Upright in bed;Postural control adequate for testing Baseline Vocal Quality: Normal Volitional Cough: Strong Volitional Swallow: Able to elicit    Oral/Motor/Sensory Function Overall Oral Motor/Sensory Function: Within functional limits   Ice Chips Ice chips: Within functional limits Presentation: Spoon   Thin Liquid Thin Liquid: Within functional limits Presentation: Straw    Nectar Thick Nectar Thick Liquid: Not  tested  Honey Thick Honey Thick Liquid: Not tested   Puree Puree: Within functional limits Presentation: Spoon   Solid     Solid: Within functional limits     Ariana Wilkinson I. Ariana Clock, MS, CCC-SLP Acute Rehabilitation Services Office number [phone] Pager [phone]  Ariana Wilkinson 01/28/2021,1:27 PM

## 2021-01-28 NOTE — Evaluation (Signed)
Occupational Therapy Evaluation Patient Details Name: Ariana Wilkinson MRN: [id number] DOB: June 13, 1951 Today's Date: 01/28/2021    History of Present Illness 70 yo female presenting to [facility] health ED on 6/15 with confusion and AKI. PMH including critical blindness, type 1 diabetes on insulin pump, s/p lumbar fusion surgery at L5-S1 on 12/26/2020 by Dr. [name], readmitted for post infection/abscess with washout surgery on 01/11/2021, infection site growing Pseudomonas sensitive to cefepime, discharged on 01/16/2021 to complete 4 weeks of IV antibiotics.   Clinical Impression   Pt alert, but demonstrating continued impaired cognition. Pt requires +2 assist for all mobility. She was able to stand and take several steps to [facility] with RW. Pt needed total assist for pericare and grooming. Pt with increased edema in all areas, elevated extremities on pillows.  Pt and husband insist pt will return home. Will continue to follow.     Follow Up Recommendations  Home health OT;Supervision/Assistance - 24 hour (may need SNF depending on progress)    Equipment Recommendations  None recommended by OT    Recommendations for Other Services       Precautions / Restrictions Precautions Precautions: Fall;Back Precaution Booklet Issued: No Precaution Comments: no specific order for back precautions Restrictions Weight Bearing Restrictions: No      Mobility Bed Mobility Overal bed mobility: Needs Assistance Bed Mobility: Rolling;Sidelying to Sit;Sit to Sidelying Rolling: Total assist Sidelying to sit: +2 for physical assistance;Mod assist     Sit to sidelying: +2 for physical assistance;Max assist General bed mobility comments: cues for log roll    Transfers Overall transfer level: Needs assistance Equipment used: Rolling walker (2 wheeled) Transfers: Sit to/from Stand Sit to Stand: +2 physical assistance;Min assist         General transfer comment: allowed hands on walker, +2 min to rise  and steady, pt took several steps toward [facility]    Balance     Sitting balance-Leahy Scale: Fair Sitting balance - Comments: sat x 10 minutes at EOB   Standing balance support: Bilateral upper extremity supported;During functional activity Standing balance-Leahy Scale: Poor Standing balance comment: B UE support on walker and +2 min assist                           ADL either performed or assessed with clinical judgement   ADL Overall ADL's : Needs assistance/impaired                                       General ADL Comments: Total assist for pericare and to comb hair.     Vision         Perception     Praxis      Pertinent Vitals/Pain Pain Assessment: Faces Faces Pain Scale: Hurts little more Pain Location: Back Pain Descriptors / Indicators: Discomfort;Grimacing;Moaning Pain Intervention(s): Monitored during session;Repositioned     Hand Dominance     Extremity/Trunk Assessment Upper Extremity Assessment Upper Extremity Assessment:  (elevated edematous UEs and instructed to pump hands)   Lower Extremity Assessment Lower Extremity Assessment: Defer to PT evaluation       Communication     Cognition Arousal/Alertness: Awake/alert Behavior During Therapy: WFL for tasks assessed/performed Overall Cognitive Status: Impaired/Different from baseline Area of Impairment: Orientation;Memory;Following commands;Safety/judgement;Awareness;Problem solving;Attention                 Orientation Level: Disoriented to;Place;Time;Situation  Current Attention Level: Sustained Memory: Decreased short-term memory;Decreased recall of precautions Following Commands: Follows one step commands inconsistently;Follows one step commands with increased time Safety/Judgement: Decreased awareness of deficits Awareness: Intellectual Problem Solving: Slow processing;Decreased initiation;Difficulty sequencing;Requires verbal cues;Requires tactile  cues General Comments: repeats, "What are you trying to do?" throughout session   General Comments       Exercises     Shoulder Instructions      Home Living                                          Prior Functioning/Environment                   OT Problem List:        OT Treatment/Interventions:      OT Goals(Current goals can be found in the care plan section) Acute Rehab OT Goals Patient Stated Goal: to take her home OT Goal Formulation: With family Time For Goal Achievement: 02/08/21 Potential to Achieve Goals: Good  OT Frequency: Min 3X/week   Barriers to D/C:            Co-evaluation              AM-PAC OT "6 Clicks" Daily Activity     Outcome Measure Help from another person eating meals?: Total Help from another person taking care of personal grooming?: Total Help from another person toileting, which includes using toliet, bedpan, or urinal?: Total Help from another person bathing (including washing, rinsing, drying)?: Total Help from another person to put on and taking off regular upper body clothing?: Total Help from another person to put on and taking off regular lower body clothing?: Total 6 Click Score: 6   End of Session Equipment Utilized During Treatment: Gait belt;Rolling walker  Activity Tolerance: Patient tolerated treatment well Patient left: in bed;with call bell/phone within reach;with bed alarm set;with family/visitor present  OT Visit Diagnosis: Unsteadiness on feet (R26.81);Other abnormalities of gait and mobility (R26.89);Muscle weakness (generalized) (M62.81)                Time: 1350-1420 OT Time Calculation (min): 30 min Charges:  OT General Charges $OT Visit: 1 Visit OT Treatments $Therapeutic Activity: 23-37 mins  Martie Round, OTR/L Acute Rehabilitation Services Pager: [phone] Office: [phone]  Evern Bio 01/28/2021, 2:50 PM

## 2021-01-28 NOTE — Progress Notes (Signed)
[facility] ROUNDING NOTE   Subjective:   Interval History: This is a 70 year old lady with history of diabetes mellitus type 1.  Status post lumbar fusion L5-S1 12/26/2020.  Readmitted for infection abscess and washout 01/11/2021.  Discharged 01/16/2021.  Presented to [facility] 01/23/2021 with an elevated creatinine.  Baseline creatinine about 1.2 to 1.4 mg/dL.  Suspected sepsis UTI.  No hematuria proteinuria.  Suspected volume depletion getting IV fluids.  Creatinine slightly improved.    Urine output 800 cc 01/27/2021  Blood pressure 185/66 pulse 80 temperature 98 O2 sats 96% room air  Sodium 131 potassium 4 chloride 98 CO2 22 BUN 44 creatinine 4.44 glucose 116 phosphorus 3.2 magnesium 1.7 albumin 1.5 hemoglobin 9.5  Objective:  Vital signs in last 24 hours:  Temp:  [98 F (36.7 C)-99.2 F (37.3 C)] 98.3 F (36.8 C) (06/20 0458) Pulse Rate:  [76-89] 80 (06/20 0458) Resp:  [14-16] 16 (06/20 0458) BP: (176-194)/(62-68) 185/66 (06/20 0458) SpO2:  [94 %-96 %] 96 % (06/20 0458)  Weight change:  Filed Weights   01/26/21 0900 01/27/21 0357  Weight: 54 kg 57.6 kg    Intake/Output: I/O last 3 completed shifts: In: 2717.5 [I.V.:1417.5; IV Piggyback:1300] Out: 800 [Urine:800]   Intake/Output this shift:  No intake/output data recorded.  Gen no distress, a little more receptive to noise, pt is blind No rash, cyanosis or gangrene Sclera anicteric, throat clear No jvd or bruits Chest clear bilat to bases, no rales/ wheezing RRR no MRG Abd soft ntnd no mass or ascites +bs GU foley cath draining light yellow clear urine MS no joint effusions or deformity Ext no LE or UE edema, no wounds or ulcers Neuro is generally quite weak, not following commands   Basic Metabolic Panel: Recent Labs  Lab 01/25/21 0400 01/25/21 1658 01/26/21 0318 01/26/21 2038 01/27/21 0316 01/27/21 1726 01/28/21 0313  NA 131*   < > 132* 129* 131* 130* 131*  K 3.3*   < > 3.3* 3.0*  3.4* 3.1* 4.0  CL 100   < > 98 94* 94* 98 98  CO2 17*   < > 22 22 24 23 22   GLUCOSE 222*   < > 242* 258* 187* 167* 116*  BUN 51*   < > 54* 53* 50* 45* 44*  CREATININE 4.21*   < > 4.77* 5.05* 4.80* 4.44* 4.44*  CALCIUM 8.1*   < > 7.6* 7.2* 7.1* 6.4* 7.4*  MG 1.7  --  1.6*  --  1.9  --  1.7  PHOS 3.6  --  3.2  --  2.6  --  3.2   < > = values in this interval not displayed.    Liver Function Tests: Recent Labs  Lab 01/26/21 0318 01/27/21 0316 01/28/21 0313  AST 12* 16 15  ALT 8 7 8   ALKPHOS 76 74 76  BILITOT 0.4 0.1* 0.4  PROT 5.2* 5.1* 5.1*  ALBUMIN 1.7* 1.6* 1.5*   No results for input(s): LIPASE, AMYLASE in the last 168 hours. Recent Labs  Lab 01/25/21 1658  AMMONIA 34    CBC: Recent Labs  Lab 01/25/21 0400 01/26/21 0318 01/27/21 0316 01/28/21 0313  WBC 9.7 12.3* 14.1* 13.4*  NEUTROABS  --  9.8* 10.9* 10.2*  HGB 9.2* 9.2* 9.5* 9.5*  HCT 28.5* 28.2* 29.5* 29.4*  MCV 99.0 97.9 97.7 98.0  PLT 293 297 311 275    Cardiac Enzymes: No results for input(s): CKTOTAL, CKMB, CKMBINDEX, TROPONINI in the last 168 hours.  BNP:  Invalid input(s): POCBNP  CBG: Recent Labs  Lab 01/27/21 1312 01/27/21 1711 01/27/21 1954 01/28/21 0008 01/28/21 0456  GLUCAP 191* 164* 112* 170* 112*    Microbiology: Results for orders placed or performed during the hospital encounter of 01/24/21  Culture, Urine     Status: None   Collection Time: 01/25/21  4:02 PM   Specimen: Urine, Random  Result Value Ref Range Status   Specimen Description URINE, RANDOM  Final   Special Requests NONE  Final   Culture   Final    NO GROWTH Performed at [facility], [address], [place], [place] [address]    Report Status 01/27/2021 FINAL  Final    Coagulation Studies: No results for input(s): LABPROT, INR in the last 72 hours.  Urinalysis: Recent Labs    01/25/21 1602  COLORURINE YELLOW  LABSPEC 1.018  PHURINE 5.0  GLUCOSEU NEGATIVE  HGBUR SMALL*  BILIRUBINUR NEGATIVE   KETONESUR 5*  PROTEINUR 100*  NITRITE NEGATIVE  LEUKOCYTESUR LARGE*      Imaging: No results found.   Medications:    sodium chloride 100 mL/hr at 01/28/21 0049   ceFEPime (MAXIPIME) IV 1 g (01/27/21 1714)   linezolid (ZYVOX) IV 600 mg (01/27/21 2100)    Chlorhexidine Gluconate Cloth  6 each Topical Daily   heparin  5,000 Units Subcutaneous Q8H   insulin aspart  0-15 Units Subcutaneous Q4H   insulin detemir  12 Units Subcutaneous QHS   levothyroxine  75 mcg Intravenous Daily   metoprolol tartrate  5 mg Intravenous TID   acetaminophen, hydrALAZINE, HYDROmorphone (DILAUDID) injection, ondansetron (ZOFRAN) IV, oxyCODONE, senna-docusate, sodium chloride flush  Assessment/ Plan:  Acute kidney injury Baseline serum creatinine 1.2 to 1.4 mg deciliter increasing creatinine on admission.  Making urine.  An active urine sediment.  Foley catheter in place. Recent lumbar fusion treated with Zyvox and cefepime for 4 weeks.  Complicated by postop infection.  Positive for Pseudomonas.  Continues on cefepime and linezolid Diabetes mellitus type 1 Long history. Hypertension blood pressure on higher side.  Continue to follow. Hypothyroidism on replacement therapy   LOS: 4 [name] @TODAY @7 :52 AM

## 2021-01-28 NOTE — Progress Notes (Signed)
Ariana Wilkinson  IRW:[id number] DOB: 1950/12/07 DOA: 01/24/2021 PCP: [name], MD     Brief Narrative:  70 y.o. WF PMHx  critical blindness, DM type 1 diabetes on insulin pump, s/p Lumbar fusion surgery at L5-S1 on 12/26/2020 by Dr. [name], orthopedic surgery, readmitted for post infection/abscess with washout surgery on 01/11/2021, infection site growing Pseudomonas sensitive to cefepime, discharged on 01/16/2021 to complete 4 weeks of IV antibiotics,   Presents to [facility] health ED on 01/23/2021 for acute confusion of less than a day duration and AKI.  Creatinine elevated 4.2 up from a baseline of 1.0, BUN of 53 up from 12.  CT abdomen pelvis showing post renal obstruction from distended bladder and chronic aorto iliac, mesenteric artery calcifications.  Foley catheter was placed on 01/24/2021.  Patient was accepted as a direct admission from [facility] health ED to [facility] 50M unit by Dr. Blake Divine, [facility].  Majority of the history is obtained from patient's husband via phone and Dr. Blake Divine admission Ariana note.  At the time of this visit the patient is obtunded and not able to provide a history.  She had received a dose of IV Haldol in the ED at [facility] Per review of medical record on paper chart.   ED Course:  Temperature 98.6, BP 195/77, pulse 93, respiratory 20, O2 saturation 94% on room air.  Lab studies remarkable for serum sodium 133, potassium 4.3, chloride 105, serum bicarb 15, BUN 53, creatinine 4.20 with GFR of 10, serum glucose 76.  Magnesium 1.9.  proBNP 3600, WBC 13.2, hemoglobin 10.5, creatinine 32.4.  Platelet count 311.  Troponin less than 0.01.  VBG pH 7.22, PCO2 42.  Lactic acid 1.3.  Procalcitonin 4.0.  While in the ED patient received IV cefepime, isotonic bicarb, linezolid.  COVID-19 screening test negative, influenza AMB negative.  Foley catheter insertion on 01/24/2021 at [facility] health ED.  EKG showing normal sinus rhythm rate of 88, nonspecific ST-T  changes.  QTc 404.   Subjective: 6/20 afebrile overnight, BP trended up.  Patient much more alert today, A/O x3 (does not know why) answers yes and no to simple questions.  Follows commands.   Assessment & Plan: Covid vaccination;   Active Problems:   AKI (acute kidney injury) (HCC)   Acute urinary retention   Metabolic acidosis, normal anion gap (NAG)   Acute metabolic encephalopathy    AKI, (baseline Cr 1.0 with GFR>) -multifactorial secondary to prerenal from poor oral intake, post renal from urinary retention. Lab Results  Component Value Date   CREATININE 4.44 (H) 01/28/2021   CREATININE 4.44 (H) 01/27/2021   CREATININE 4.80 (H) 01/27/2021   CREATININE 5.05 (H) 01/26/2021   CREATININE 4.77 (H) 01/26/2021  - 6/17 Sodium bicarb-D5W 33ml/hr discontinued 6/18 per nephrology recommendation -6/17 sodium bicarb 1 amp -6/17 UPEP/SPEP pending -6/17 renal ultrasound pending - strict in and out +3.3 L - Daily weight Filed Weights   01/26/21 0900 01/27/21 0357 01/28/21 0850  Weight: 54 kg 57.6 kg 63.2 kg  -6/17 BMP 0500/1700 -Avoid nephrotoxic agent, dehydration and hypotension. - 6/18 discussed case with Dr. Delano Metz nephrology who will see patient.  Await recommendations -6/19 normal saline 125 ml/hr per nephrology recommendation  Acute urinary retention -6/16 bladder distention: S/p  Foley catheter insertion  in the ED at [facility]   Non anion gap metabolic acidosis in the setting of AKI. -Presented with serum bicarb of 15 with VBG pH of 7.22 -See AKI  -6/19  resolved  Acute metabolic encephalopathy, likely secondary to acute illness -Per husband via phone confusion started acutely the night prior to her presentation to the ED on 01/23/2021. -6/16 UDS negative, at [facility] -Received dose of Haldol 2 mg on 6/16 at 1203 at [facility] -6/17 may have received multiple doses of narcotics prior to arrival.  We will try to ascertain -6/17 review of  hardcopy chart from [facility] does not appear that patient had CT or MRI brain. -6/17 MRI brain pending: Nondiagnostic   Type 1 diabetes with hypoglycemia -On insulin pump (hold) - 6/18 increase Levemir 12 units daily - 6/17 moderate SSI    Status post lumbar fusion surgery at L5-S1 on 12/26/2020, postop infection/abscess with washout surgery on 01/11/2021 -Infection site growing Pseudomonas sensitive to cefepime, continue -Continue linezolid -She was discharged on 01/16/2021 to complete 4 weeks of IV antibiotics -Blood cultures pending  -Per husband she was scheduled to follow-up with infectious disease outpatient on 02/05/2021.   Essential hypertension -BP not at goal, elevated - 6/18 change Metoprolol IV 5 MG TID -6/20 Hydralazine IV 5 mg scheduled  -Resume home oral antihypertensives when no longer NPO -Monitor vital signs closely   Hypothyroidism -6/17 TSH= 33.4 -Switch p.o. Synthroid to Synthroid IV 37.5 mcg daily  -6/17 ADDENDUM: Increased Synthroid IV 75 mcg daily  Hypokalemia - Potassium goal> 4  Hypomagnesmia - Magnesium goal> 2 -6/20 magnesium IV 2g  Hypocalcemia - 6/19 corrected calcium= 9.0 -6/19 repeat corrected calcium= 8.3 - 6/20 corrected calcium= 9.4, continue to monitor closely    Chronic aorto iliac, mesenteric artery calcifications -Resume home Lipitor when no longer NPO  Dysphagia - 6/20 swallow study; passed swallow study.  Diet regular, thin liquid       DVT prophylaxis: Subcu heparin Code Status: Full Family Communication: 6/20 husband and daughter at bedside discussed plan of care answered all questions Status is: Inpatient    Dispo: The patient is from: Home              Anticipated d/c is to: Home              Anticipated d/c date is: 6/25              Patient currently unstable      Consultants:  Nephrology   Procedures/Significant Events:  6/17 MRI brain:No acute intracranial abnormality. 2. Generalized volume  loss without a clear lobar predilection 6/17 MRI L-spine Interval postoperative changes of L5-S1 posterior and interbody fusion. No appreciable epidural fluid collection, although evaluation is somewhat degraded by motion artifact and lack of postcontrast imaging. 2. Multilevel degenerative changes of the lumbar spine are similar to the prior study. There is moderate canal stenosis at L3-4 and L4-5 and mild canal stenosis at L1-2 and L2-3. 6/17 US renal:Echogenic kidneys consistent with medical renal disease. No hydronephrosis 2. Small free fluid in the right lower quadrant. Trace right pleural effusion.   I have personally reviewed and interpreted all radiology studies and my findings are as above.  VENTILATOR SETTINGS: Room air 6/20 SPO2 95%   Cultures 6/16 SARS coronavirus negative 6/16 influenza A/B negative 6/17 blood pending 6/17 urine pending   Antimicrobials: Anti-infectives (From admission, onward)    Start     Ordered Stop   01/26/21 1245  ceFEPIme (MAXIPIME) 1 g in sodium chloride 0.9 % 100 mL IVPB        01/26/21 1152     01/25/21 1000  ceFEPIme (MAXIPIME) 2 g in sodium chloride  0.9 % 100 mL IVPB  Status:  Discontinued        01/25/21 0527 01/26/21 1152   01/25/21 0100  linezolid (ZYVOX) IVPB 600 mg        01/24/21 2345           Devices    LINES / TUBES:      Continuous Infusions:  sodium chloride 100 mL/hr at 01/28/21 0049   ceFEPime (MAXIPIME) IV 1 g (01/27/21 1714)   linezolid (ZYVOX) IV 600 mg (01/27/21 2100)     Objective: Vitals:   01/27/21 1839 01/27/21 2117 01/28/21 0458 01/28/21 0850  BP: (!) 180/68 (!) 194/62 (!) 185/66   Pulse: 76 83 80   Resp: 16 14 16    Temp: 99.2 F (37.3 C) 98.1 F (36.7 C) 98.3 F (36.8 C)   TempSrc: Oral Oral Oral   SpO2: 94% 95% 96%   Weight:    63.2 kg  Height:        Intake/Output Summary (Last 24 hours) at 01/28/2021 0859 Last data filed at 01/28/2021 0500 Gross per 24 hour  Intake 2317.49 ml   Output --  Net 2317.49 ml    Filed Weights   01/26/21 0900 01/27/21 0357 01/28/21 0850  Weight: 54 kg 57.6 kg 63.2 kg   Physical Exam:  General: Somnolent but much more alert today, will answer simple questions.  A/O x1 (does not know where, when, why) No acute respiratory distress Eyes: negative scleral hemorrhage, negative anisocoria, negative icterus ENT: Negative Runny nose, negative gingival bleeding, Neck:  Negative scars, masses, torticollis, lymphadenopathy, JVD Lungs: Clear to auscultation bilaterally without wheezes or crackles Cardiovascular: Regular rate and rhythm without murmur gallop or rub normal S1 and S2 Abdomen: negative abdominal pain, nondistended, positive soft, bowel sounds, no rebound, no ascites, no appreciable mass Extremities: No significant cyanosis, clubbing, or edema bilateral lower extremities Skin: Negative rashes, lesions, ulcers Psychiatric: Unable to assess secondary to altered mental status Central nervous system:  Cranial nerves II through XII intact, tongue/uvula midline, all extremities muscle strength 5/5, sensation intact throughout, f negative dysarthria, negative expressive aphasia, negative receptive aphasia.  Follows most commands      Data Reviewed: Care during the described time interval was provided by me .  I have reviewed this patient's available data, including medical history, events of note, physical examination, and all test results as part of my evaluation.  CBC: Recent Labs  Lab 01/25/21 0400 01/26/21 0318 01/27/21 0316 01/28/21 0313  WBC 9.7 12.3* 14.1* 13.4*  NEUTROABS  --  9.8* 10.9* 10.2*  HGB 9.2* 9.2* 9.5* 9.5*  HCT 28.5* 28.2* 29.5* 29.4*  MCV 99.0 97.9 97.7 98.0  PLT 293 297 311 275    Basic Metabolic Panel: Recent Labs  Lab 01/25/21 0400 01/25/21 1658 01/26/21 0318 01/26/21 2038 01/27/21 0316 01/27/21 1726 01/28/21 0313  NA 131*   < > 132* 129* 131* 130* 131*  K 3.3*   < > 3.3* 3.0* 3.4* 3.1* 4.0   CL 100   < > 98 94* 94* 98 98  CO2 17*   < > 22 22 24 23 22   GLUCOSE 222*   < > 242* 258* 187* 167* 116*  BUN 51*   < > 54* 53* 50* 45* 44*  CREATININE 4.21*   < > 4.77* 5.05* 4.80* 4.44* 4.44*  CALCIUM 8.1*   < > 7.6* 7.2* 7.1* 6.4* 7.4*  MG 1.7  --  1.6*  --  1.9  --  1.7  PHOS 3.6  --  3.2  --  2.6  --  3.2   < > = values in this interval not displayed.    GFR: Estimated Creatinine Clearance: 9.7 mL/min (A) (by C-G formula based on SCr of 4.44 mg/dL (H)). Liver Function Tests: Recent Labs  Lab 01/26/21 0318 01/27/21 0316 01/28/21 0313  AST 12* 16 15  ALT ALKPHOS 76 74 76  BILITOT 0.4 0.1* 0.4  PROT 5.2* 5.1* 5.1*  ALBUMIN 1.7* 1.6* 1.5*    No results for input(s): LIPASE, AMYLASE in the last 168 hours. Recent Labs  Lab 01/25/21 1658  AMMONIA 34    Coagulation Profile: No results for input(s): INR, PROTIME in the last 168 hours. Cardiac Enzymes: No results for input(s): CKTOTAL, CKMB, CKMBINDEX, TROPONINI in the last 168 hours. BNP (last 3 results) No results for input(s): PROBNP in the last 8760 hours. HbA1C: No results for input(s): HGBA1C in the last 72 hours. CBG: Recent Labs  Lab 01/27/21 1711 01/27/21 1954 01/28/21 0008 01/28/21 0456 01/28/21 0817  GLUCAP 164* 112* 170* 112* 57*    Lipid Profile: Recent Labs    01/25/21 1658 01/26/21 0318  CHOL 159 160  HDL 40* 39*  LDLCALC 95 99  TRIG 118 110  CHOLHDL 4.0 4.1    Thyroid Function Tests: Recent Labs    01/25/21 1658  TSH 33.412*    Anemia Panel: No results for input(s): VITAMINB12, FOLATE, FERRITIN, TIBC, IRON, RETICCTPCT in the last 72 hours. Sepsis Labs: Recent Labs  Lab 01/25/21 0400 01/26/21 0318 01/27/21 0316 01/28/21 0313  PROCALCITON 7.07 8.36 6.67 4.61     Recent Results (from the past 240 hour(s))  Culture, Urine     Status: None   Collection Time: 01/25/21  4:02 PM   Specimen: Urine, Random  Result Value Ref Range Status   Specimen Description URINE,  RANDOM  Final   Special Requests NONE  Final   Culture   Final    NO GROWTH Performed at [facility], [address] [place], [place] [address]    Report Status 01/27/2021 FINAL  Final         Radiology Studies: No results found.      Scheduled Meds:  Chlorhexidine Gluconate Cloth  6 each Topical Daily   heparin  5,000 Units Subcutaneous Q8H   insulin aspart  0-15 Units Subcutaneous Q4H   insulin detemir  12 Units Subcutaneous QHS   levothyroxine  75 mcg Intravenous Daily   metoprolol tartrate  5 mg Intravenous TID   Continuous Infusions:  sodium chloride 100 mL/hr at 01/28/21 0049   ceFEPime (MAXIPIME) IV 1 g (01/27/21 1714)   linezolid (ZYVOX) IV 600 mg (01/27/21 2100)     LOS: 4 days    Time spent:40 min    Ladye Macnaughton, Roselind Messier, MD [facility]   If 7PM-7AM, please contact night-coverage 01/28/2021, 8:59 AM

## 2021-01-28 NOTE — Progress Notes (Signed)
Inpatient Diabetes Program Recommendations  AACE/ADA: New Consensus Statement on Inpatient Glycemic Control (2015)  Target Ranges:  Prepandial:   less than 140 mg/dL      Peak postprandial:   less than 180 mg/dL (1-2 hours)      Critically ill patients:  140 - 180 mg/dL   Lab Results  Component Value Date   GLUCAP 140 (H) 01/28/2021    Review of Glycemic Control Results for Ariana Wilkinson, Ariana Wilkinson (MRN [id number]) as of 01/28/2021 10:12  Ref. Range 01/28/2021 04:56 01/28/2021 08:17 01/28/2021 09:04  Glucose-Capillary Latest Ref Range: 70 - 99 mg/dL 093 (H) 57 (L) 235 (H)   Diabetes history: Type 1 DM Outpatient Diabetes medications: Humalog insulin pump Current orders for Inpatient glycemic control: Novolog 0-15 units Q4H, Levemir 12 units QHS  Inpatient Diabetes Program Recommendations:    Noted hypoglycemic episode this AM of 57 mg/dL. With renal status, consider reducing correction to Novolog 0-6 units Q4H.   Thanks, Lujean Rave, MSN, RNC-OB Diabetes Coordinator [phone] (8a-5p)

## 2021-01-28 NOTE — Progress Notes (Signed)
Hypoglycemic Event   CBG: 57   Treatment: Dextrose 50% solution   Symptoms: None   Follow-up CBG: Time: 0904   CBG Result:140   Possible Reasons for Event: NPO   Comments/MD notified:hypoglycemic protocol followed

## 2021-01-29 LAB — CBC WITH DIFFERENTIAL/PLATELET
Abs Immature Granulocytes: 0.06 10*3/uL (ref 0.00–0.07)
Basophils Absolute: 0.2 10*3/uL — ABNORMAL HIGH (ref 0.0–0.1)
Basophils Relative: 2 %
Eosinophils Absolute: 0.4 10*3/uL (ref 0.0–0.5)
Eosinophils Relative: 3 %
HCT: 29.5 % — ABNORMAL LOW (ref 36.0–46.0)
Hemoglobin: 9.3 g/dL — ABNORMAL LOW (ref 12.0–15.0)
Immature Granulocytes: 1 %
Lymphocytes Relative: 15 %
Lymphs Abs: 1.8 10*3/uL (ref 0.7–4.0)
MCH: 31.6 pg (ref 26.0–34.0)
MCHC: 31.5 g/dL (ref 30.0–36.0)
MCV: 100.3 fL — ABNORMAL HIGH (ref 80.0–100.0)
Monocytes Absolute: 1.3 10*3/uL — ABNORMAL HIGH (ref 0.1–1.0)
Monocytes Relative: 11 %
Neutro Abs: 8.4 10*3/uL — ABNORMAL HIGH (ref 1.7–7.7)
Neutrophils Relative %: 68 %
Platelets: 284 10*3/uL (ref 150–400)
RBC: 2.94 MIL/uL — ABNORMAL LOW (ref 3.87–5.11)
RDW: 14.5 % (ref 11.5–15.5)
WBC: 12.1 10*3/uL — ABNORMAL HIGH (ref 4.0–10.5)
nRBC: 0 % (ref 0.0–0.2)

## 2021-01-29 LAB — UPEP/UIFE/LIGHT CHAINS/TP, 24-HR UR
% BETA, Urine: 22.1 %
ALPHA 1 URINE: 6.8 %
Albumin, U: 29.9 %
Alpha 2, Urine: 24.5 %
Free Kappa Lt Chains,Ur: 145.9 mg/L — ABNORMAL HIGH (ref 1.17–86.46)
Free Kappa/Lambda Ratio: 1.62 — ABNORMAL LOW (ref 1.83–14.26)
Free Lambda Lt Chains,Ur: 90.34 mg/L — ABNORMAL HIGH (ref 0.27–15.21)
GAMMA GLOBULIN URINE: 16.7 %
Total Protein, Urine-Ur/day: 1175 mg/24 hr — ABNORMAL HIGH (ref 30–150)
Total Protein, Urine: 90.4 mg/dL
Total Volume: 1300

## 2021-01-29 LAB — MAGNESIUM: Magnesium: 2 mg/dL (ref 1.7–2.4)

## 2021-01-29 LAB — GLUCOSE, CAPILLARY
Glucose-Capillary: 157 mg/dL — ABNORMAL HIGH (ref 70–99)
Glucose-Capillary: 162 mg/dL — ABNORMAL HIGH (ref 70–99)
Glucose-Capillary: 179 mg/dL — ABNORMAL HIGH (ref 70–99)
Glucose-Capillary: 183 mg/dL — ABNORMAL HIGH (ref 70–99)
Glucose-Capillary: 190 mg/dL — ABNORMAL HIGH (ref 70–99)
Glucose-Capillary: 57 mg/dL — ABNORMAL LOW (ref 70–99)
Glucose-Capillary: 65 mg/dL — ABNORMAL LOW (ref 70–99)
Glucose-Capillary: 99 mg/dL (ref 70–99)

## 2021-01-29 LAB — COMPREHENSIVE METABOLIC PANEL
ALT: 9 U/L (ref 0–44)
AST: 14 U/L — ABNORMAL LOW (ref 15–41)
Albumin: 1.4 g/dL — ABNORMAL LOW (ref 3.5–5.0)
Alkaline Phosphatase: 67 U/L (ref 38–126)
Anion gap: 12 (ref 5–15)
BUN: 45 mg/dL — ABNORMAL HIGH (ref 8–23)
CO2: 18 mmol/L — ABNORMAL LOW (ref 22–32)
Calcium: 7.4 mg/dL — ABNORMAL LOW (ref 8.9–10.3)
Chloride: 103 mmol/L (ref 98–111)
Creatinine, Ser: 4.16 mg/dL — ABNORMAL HIGH (ref 0.44–1.00)
GFR, Estimated: 11 mL/min — ABNORMAL LOW (ref >60)
Glucose, Bld: 78 mg/dL (ref 70–99)
Potassium: 3.4 mmol/L — ABNORMAL LOW (ref 3.5–5.1)
Sodium: 133 mmol/L — ABNORMAL LOW (ref 135–145)
Total Bilirubin: 0.5 mg/dL (ref 0.3–1.2)
Total Protein: 4.9 g/dL — ABNORMAL LOW (ref 6.5–8.1)

## 2021-01-29 LAB — PHOSPHORUS: Phosphorus: 3.1 mg/dL (ref 2.5–4.6)

## 2021-01-29 MED ORDER — POTASSIUM CHLORIDE 10 MEQ/100ML IV SOLN: 10.0000 meq | INTRAVENOUS | Status: DC

## 2021-01-29 MED ORDER — POTASSIUM CHLORIDE 10 MEQ/100ML IV SOLN
10.0000 meq | INTRAVENOUS | Status: AC
  Administered 2021-01-29 (×3): 10 meq via INTRAVENOUS
  Filled 2021-01-29 (×3): qty 100

## 2021-01-29 NOTE — Progress Notes (Signed)
Hypoglycemic Event  CBG: 57  Treatment: 4 oz juice/soda  Symptoms: None  Follow-up CBG: Time:0658 CBG Result:99  Possible Reasons for Event: Inadequate meal intake and Medication regimen:    Comments/MD notified:Pt was initially given 4oz of apple juice which brought her BG to 65. She was given some more which brough BG to [address]

## 2021-01-29 NOTE — Progress Notes (Signed)
Physical Therapy Treatment Patient Details Name: Ariana Wilkinson MRN: [id number] DOB: [birth year] Today's Date: 01/29/2021    History of Present Illness 70 yo female presenting to [facility] health ED on 6/15 with confusion and AKI. PMH including critical blindness, type 1 diabetes on insulin pump, s/p lumbar fusion surgery at L5-S1 on 12/26/2020 by Dr. [name], readmitted for post infection/abscess with washout surgery on 01/11/2021, infection site growing Pseudomonas sensitive to cefepime, discharged on 01/16/2021 to complete 4 weeks of IV antibiotics.    PT Comments    Pt sitting on BSC with NT present on entry. Pt requires modAx2 for coming to standing and total A for pericare. Pt is modAx2 for ambulation with RW requiring multimodal cues to progress due to visual impairment. Pt is maxAx2 to return to bed. Discussed possibility of SNF level rehab and pt and husband are refusing due mainly to pt's visual impairment. PT will continue to work with pt to be able to navigate 3 steps into her home at discharge. PT also recommending wheelchair to aide in safe mobility due to decreased strength and endurance.    Follow Up Recommendations  Home health PT;Supervision/Assistance - 24 hour     Equipment Recommendations  Wheelchair (measurements PT);Wheelchair cushion (measurements PT)       Precautions / Restrictions Precautions Precautions: Fall;Back Precaution Booklet Issued: No Precaution Comments: no specific order for back precautions Restrictions Weight Bearing Restrictions: No    Mobility  Bed Mobility Overal bed mobility: Needs Assistance Bed Mobility: Sidelying to Sit         Sit to sidelying: +2 for physical assistance;Max assist General bed mobility comments: cues for log roll but rolls back on back before LE are back to bed    Transfers Overall transfer level: Needs assistance Equipment used: Rolling walker (2 wheeled) Transfers: Sit to/from Stand Sit to Stand: +2 physical  assistance;Mod assist         General transfer comment: with hands on RW pt requires modAx2 for power up and steadying at the RW  Ambulation/Gait Ambulation/Gait assistance: Mod assist;+2 physical assistance Gait Distance (Feet): 16 Feet Assistive device: Rolling walker (2 wheeled) Gait Pattern/deviations: Step-through pattern;Decreased step length - right;Decreased step length - left;Shuffle;Trunk flexed Gait velocity: slowed Gait velocity interpretation: <1.31 ft/sec, indicative of household ambulator General Gait Details: mod Ax2 for steadying with RW, pt with very short step length, requires multimodal cuing for advancement of ambulation, secondary in part to limited vision       Balance Overall balance assessment: Needs assistance Sitting-balance support: No upper extremity supported;Feet supported Sitting balance-Leahy Scale: Fair Sitting balance - Comments: sitting on BSC on entry   Standing balance support: Bilateral upper extremity supported;During functional activity Standing balance-Leahy Scale: Poor                              Cognition Arousal/Alertness: Awake/alert Behavior During Therapy: WFL for tasks assessed/performed Overall Cognitive Status: Impaired/Different from baseline Area of Impairment: Orientation;Memory;Following commands;Safety/judgement;Awareness;Problem solving;Attention                 Orientation Level: Disoriented to;Place;Time;Situation Current Attention Level: Sustained Memory: Decreased short-term memory;Decreased recall of precautions Following Commands: Follows one step commands inconsistently;Follows one step commands with increased time Safety/Judgement: Decreased awareness of deficits Awareness: Intellectual Problem Solving: Slow processing;Decreased initiation;Difficulty sequencing;Requires verbal cues;Requires tactile cues General Comments: pt reports walking into bathroom with RN      Exercises  General Comments General comments (skin integrity, edema, etc.): husband present throughout and states he will carry his wife if he has to to take her home      Pertinent Vitals/Pain Pain Assessment: Faces Faces Pain Scale: Hurts little more Pain Location: Back Pain Descriptors / Indicators: Discomfort;Grimacing;Moaning Pain Intervention(s): Limited activity within patient's tolerance;Monitored during session;Repositioned     PT Goals (current goals can now be found in the care plan section) Acute Rehab PT Goals Patient Stated Goal: to take her home PT Goal Formulation: With family Time For Goal Achievement: 02/08/21 Potential to Achieve Goals: Fair Progress towards PT goals: Progressing toward goals    Frequency    Min 3X/week      PT Plan Equipment recommendations need to be updated;Discharge plan needs to be updated       AM-PAC PT "6 Clicks" Mobility   Outcome Measure  Help needed turning from your back to your side while in a flat bed without using bedrails?: Total Help needed moving from lying on your back to sitting on the side of a flat bed without using bedrails?: A Lot Help needed moving to and from a bed to a chair (including a wheelchair)?: Total Help needed standing up from a chair using your arms (e.g., wheelchair or bedside chair)?: Total Help needed to walk in hospital room?: Total Help needed climbing 3-5 steps with a railing? : Total 6 Click Score: 7    End of Session Equipment Utilized During Treatment: Gait belt Activity Tolerance: Patient limited by lethargy Patient left: in bed;with call bell/phone within reach;with bed alarm set;with family/visitor present Nurse Communication: Mobility status PT Visit Diagnosis: Unsteadiness on feet (R26.81);Other abnormalities of gait and mobility (R26.89)     Time: 3785-8850 PT Time Calculation (min) (ACUTE ONLY): 31 min  Charges:  $Gait Training: 8-22 mins $Therapeutic Activity: 8-22 mins                      Shali Vesey B. Beverely Risen PT, DPT Acute Rehabilitation Services Pager [phone] Office [phone]    [facility] 01/29/2021, 2:41 PM

## 2021-01-29 NOTE — Progress Notes (Signed)
[facility] ROUNDING NOTE   Subjective:   Interval History: This is a 70 year old lady with history of diabetes mellitus type 1.  Status post lumbar fusion L5-S1 12/26/2020.  Readmitted for infection abscess and washout 01/11/2021.  Discharged 01/16/2021.  Presented to [facility] 01/23/2021 with an elevated creatinine.  Baseline creatinine about 1.2 to 1.4 mg/dL.  Suspected sepsis UTI.  No hematuria proteinuria.  Suspected volume depletion getting IV fluids.  Creatinine slightly improved.    Eating yogurt this morning husband at bedside updated patient's husband with progress  Urine output 450 cc recorded 01/29/2021 no urine output recorded 01/28/2021  Blood pressure 149/44 pulse 95 temperature 97.8 O2 sats 94% room air  Sodium 133 potassium 3.4 chloride 103 CO2 18 BUN 45 creatinine 4.16 glucose 78 calcium 7.4 phosphorus 3.2 magnesium 2 albumin 1.4 hemoglobin 9.3  Objective:  Vital signs in last 24 hours:  Temp:  [97.7 F (36.5 C)-98.2 F (36.8 C)] 97.8 F (36.6 C) (06/21 1004) Pulse Rate:  [84-95] 95 (06/21 1004) Resp:  [16-18] 17 (06/21 1004) BP: (149-171)/(44-55) 149/44 (06/21 1004) SpO2:  [94 %-96 %] 94 % (06/21 1004) Weight:  [63.5 kg-66.2 kg] 66.2 kg (06/21 0524)  Weight change:  Filed Weights   01/28/21 0850 01/28/21 2055 01/29/21 0524  Weight: 63.2 kg 63.5 kg 66.2 kg    Intake/Output: I/O last 3 completed shifts: In: 4617.8 [I.V.:3479.1; IV Piggyback:1138.6] Out: 0    Intake/Output this shift:  Total I/O In: 120 [P.O.:120] Out: 450 [Urine:450]  Gen no distress, a little more receptive to noise, pt is blind No rash, cyanosis or gangrene Sclera anicteric, throat clear No jvd or bruits Chest clear bilat to bases, no rales/ wheezing RRR no MRG Abd soft ntnd no mass or ascites +bs GU foley cath draining light yellow clear urine MS no joint effusions or deformity Ext no LE or UE edema, no wounds or ulcers Neuro is generally quite weak, not  following commands   Basic Metabolic Panel: Recent Labs  Lab 01/25/21 0400 01/25/21 1658 01/26/21 0318 01/26/21 2038 01/27/21 0316 01/27/21 1726 01/28/21 0313 01/29/21 0407  NA 131*   < > 132* 129* 131* 130* 131* 133*  K 3.3*   < > 3.3* 3.0* 3.4* 3.1* 4.0 3.4*  CL 100   < > 98 94* 94* 98 98 103  CO2 17*   < > 22 22 24 23 22  18*  GLUCOSE 222*   < > 242* 258* 187* 167* 116* 78  BUN 51*   < > 54* 53* 50* 45* 44* 45*  CREATININE 4.21*   < > 4.77* 5.05* 4.80* 4.44* 4.44* 4.16*  CALCIUM 8.1*   < > 7.6* 7.2* 7.1* 6.4* 7.4* 7.4*  MG 1.7  --  1.6*  --  1.9  --  1.7 2.0  PHOS 3.6  --  3.2  --  2.6  --  3.2 3.1   < > = values in this interval not displayed.     Liver Function Tests: Recent Labs  Lab 01/26/21 0318 01/27/21 0316 01/28/21 0313 01/29/21 0407  AST 12* 16 15 14*  ALT 8 7 8 9   ALKPHOS 76 74 76 67  BILITOT 0.4 0.1* 0.4 0.5  PROT 5.2* 5.1* 5.1* 4.9*  ALBUMIN 1.7* 1.6* 1.5* 1.4*    No results for input(s): LIPASE, AMYLASE in the last 168 hours. Recent Labs  Lab 01/25/21 1658  AMMONIA 34     CBC: Recent Labs  Lab 01/25/21 0400 01/26/21 0318 01/27/21  9518 01/28/21 0313 01/29/21 0407  WBC 9.7 12.3* 14.1* 13.4* 12.1*  NEUTROABS  --  9.8* 10.9* 10.2* 8.4*  HGB 9.2* 9.2* 9.5* 9.5* 9.3*  HCT 28.5* 28.2* 29.5* 29.4* 29.5*  MCV 99.0 97.9 97.7 98.0 100.3*  PLT 293 297 311 275 284     Cardiac Enzymes: No results for input(s): CKTOTAL, CKMB, CKMBINDEX, TROPONINI in the last 168 hours.  BNP: Invalid input(s): POCBNP  CBG: Recent Labs  Lab 01/29/21 0055 01/29/21 0511 01/29/21 0615 01/29/21 0658 01/29/21 1121  GLUCAP 157* 57* 65* 99 190*     Microbiology: Results for orders placed or performed during the hospital encounter of 01/24/21  Culture, Urine     Status: None   Collection Time: 01/25/21  4:02 PM   Specimen: Urine, Random  Result Value Ref Range Status   Specimen Description URINE, RANDOM  Final   Special Requests NONE  Final   Culture    Final    NO GROWTH Performed at [facility] Lab, [address], [place], [place] [address]    Report Status 01/27/2021 FINAL  Final    Coagulation Studies: No results for input(s): LABPROT, INR in the last 72 hours.  Urinalysis: No results for input(s): COLORURINE, LABSPEC, PHURINE, GLUCOSEU, HGBUR, BILIRUBINUR, KETONESUR, PROTEINUR, UROBILINOGEN, NITRITE, LEUKOCYTESUR in the last 72 hours.  Invalid input(s): APPERANCEUR     Imaging: No results found.   Medications:    sodium chloride 100 mL/hr at 01/29/21 0137   ceFEPime (MAXIPIME) IV 1 g (01/29/21 1033)    Chlorhexidine Gluconate Cloth  6 each Topical Daily   heparin  5,000 Units Subcutaneous Q8H   hydrALAZINE  5 mg Intravenous Q6H   insulin aspart  0-15 Units Subcutaneous Q4H   insulin detemir  12 Units Subcutaneous QHS   levothyroxine  75 mcg Intravenous Daily   metoprolol tartrate  5 mg Intravenous TID   acetaminophen, HYDROmorphone (DILAUDID) injection, ondansetron (ZOFRAN) IV, oxyCODONE, senna-docusate, sodium chloride flush  Assessment/ Plan:  Acute kidney injury Baseline serum creatinine 1.2 to 1.4 mg deciliter increasing creatinine on admission.  Making urine.  An active urine sediment.  Foley catheter in place.  Creatinine appears a little improved this morning hopefully will plateaued Recent lumbar fusion treated with Zyvox and cefepime for 4 weeks.  Complicated by postop infection.  Positive for Pseudomonas.  Continues on cefepime and linezolid Diabetes mellitus type 1 Long history. Hypertension blood pressure controlled Hypothyroidism on replacement therapy Hypokalemia will replete   LOS: 5 Ariana Wilkinson @TODAY @11 :37 AM

## 2021-01-29 NOTE — Plan of Care (Signed)

## 2021-01-29 NOTE — Progress Notes (Signed)
Inpatient Diabetes Program Recommendations  AACE/ADA: New Consensus Statement on Inpatient Glycemic Control (2015)  Target Ranges:  Prepandial:   less than 140 mg/dL      Peak postprandial:   less than 180 mg/dL (1-2 hours)      Critically ill patients:  140 - 180 mg/dL   Lab Results  Component Value Date   GLUCAP 99 01/29/2021   HGBA1C <4.2 (L) 01/26/2021    Review of Glycemic Control Results for TRINADEE, VERHAGEN (MRN [id number]) as of 01/29/2021 09:48  Ref. Range 01/29/2021 00:55 01/29/2021 05:11 01/29/2021 06:15 01/29/2021 06:58  Glucose-Capillary Latest Ref Range: 70 - 99 mg/dL 468 (H) 57 (L) 65 (L) 99  Results for JANYA, EVELAND (MRN [id number]) as of 01/29/2021 09:48  Ref. Range 01/28/2021 04:56 01/28/2021 08:17 01/28/2021 09:04 01/28/2021 11:27 01/28/2021 16:53  Glucose-Capillary Latest Ref Range: 70 - 99 mg/dL 500 (H) 57 (L) 370 (H) 155 (H) 177 (H)   Diabetes history: Type 1 DM Outpatient Diabetes medications: Humalog insulin pump Current orders for Inpatient glycemic control: Novolog 0-15 units Q4H, Levemir 12 units QHS   Inpatient Diabetes Program Recommendations:     Noted hypoglycemic episode this AM of 57 mg/dL. With renal status, consider reducing correction to Novolog 0-6 units Q4H (until diet improves) and reducing Levemir to 10 units QHS.  Thanks, Lujean Rave, MSN, RNC-OB Diabetes Coordinator [phone] (8a-5p)

## 2021-01-29 NOTE — Progress Notes (Signed)
PROGRESS NOTE    Ariana Wilkinson  ZOX:[id number] DOB: 05/17/1951 DOA: 01/24/2021 PCP: Barbette Reichmann, MD     Brief Narrative:  70 y.o. WF PMHx  critical blindness, DM type 1 diabetes on insulin pump, s/p Lumbar fusion surgery at L5-S1 on 12/26/2020 by Dr. [name], orthopedic surgery, readmitted for post infection/abscess with washout surgery on 01/11/2021, infection site growing Pseudomonas sensitive to cefepime, discharged on 01/16/2021 to complete 4 weeks of IV antibiotics,   Presents to [facility] health ED on 01/23/2021 for acute confusion of less than a day duration and AKI.  Creatinine elevated 4.2 up from a baseline of 1.0, BUN of 53 up from 12.  CT abdomen pelvis showing post renal obstruction from distended bladder and chronic aorto iliac, mesenteric artery calcifications.  Foley catheter was placed on 01/24/2021.  Patient was accepted as a direct admission from [facility] health ED to [facility] 20M unit by Dr. Blake Divine, [facility]  Majority of the history is obtained from patient's husband via phone and Dr. Blake Divine admission progress note.  At the time of this visit the patient is obtunded and not able to provide a history.  She had received a dose of IV Haldol in the ED at [facility] Per review of medical record on paper chart.   ED Course:  Temperature 98.6, BP 195/77, pulse 93, respiratory 20, O2 saturation 94% on room air.  Lab studies remarkable for serum sodium 133, potassium 4.3, chloride 105, serum bicarb 15, BUN 53, creatinine 4.20 with GFR of 10, serum glucose 76.  Magnesium 1.9.  proBNP 3600, WBC 13.2, hemoglobin 10.5, creatinine 32.4.  Platelet count 311.  Troponin less than 0.01.  VBG pH 7.22, PCO2 42.  Lactic acid 1.3.  Procalcitonin 4.0.  While in the ED patient received IV cefepime, isotonic bicarb, linezolid.  COVID-19 screening test negative, influenza AMB negative.  Foley catheter insertion on 01/24/2021 at [facility] health ED.  EKG showing normal sinus rhythm rate of 88, nonspecific ST-T  changes.  QTc 404.   Subjective: 6/21 afebrile overnight, BP has decreased and leveled off   Assessment & Plan: Covid vaccination;   Active Problems:   AKI (acute kidney injury) (HCC)   Acute urinary retention   Metabolic acidosis, normal anion gap (NAG)   Acute metabolic encephalopathy    AKI, (baseline Cr 1.0 with GFR>) -multifactorial secondary to prerenal from poor oral intake, post renal from urinary retention. Lab Results  Component Value Date   CREATININE 4.16 (H) 01/29/2021   CREATININE 4.44 (H) 01/28/2021   CREATININE 4.44 (H) 01/27/2021   CREATININE 4.80 (H) 01/27/2021   CREATININE 5.05 (H) 01/26/2021  - 6/17 Sodium bicarb-D5W 51ml/hr discontinued 6/18 per nephrology recommendation -6/17 sodium bicarb 1 amp -6/17 UPEP/SPEP pending -6/17 renal ultrasound pending - strict in and out +3.3 L - Daily weight Filed Weights   01/28/21 0850 01/28/21 2055 01/29/21 0524  Weight: 63.2 kg 63.5 kg 66.2 kg  -6/17 BMP 0500/1700 -Avoid nephrotoxic agent, dehydration and hypotension. - 6/18 discussed case with Dr. Delano Metz nephrology who will see patient.  Await recommendations -6/19 normal saline 125 ml/hr per nephrology recommendation  Acute urinary retention -6/16 bladder distention: S/p  Foley catheter insertion  in the ED at [facility]   Non anion gap metabolic acidosis in the setting of AKI. -Presented with serum bicarb of 15 with VBG pH of 7.22 -See AKI  -6/19 resolved  Acute metabolic encephalopathy, likely secondary to acute illness -Per husband via phone confusion started acutely the  night prior to her presentation to the ED on 01/23/2021. -6/16 UDS negative, at [facility] -Received dose of Haldol 2 mg on 6/16 at 1203 at [facility] -6/17 may have received multiple doses of narcotics prior to arrival.  We will try to ascertain -6/17 review of hardcopy chart from [facility] does not appear that patient had CT or MRI brain. -6/17 MRI  brain pending: Nondiagnostic   Type 1 diabetes with hypoglycemia -On insulin pump (hold) - 6/18 increase Levemir 12 units daily - 6/17 moderate SSI    Status post lumbar fusion surgery at L5-S1 on 12/26/2020, postop infection/abscess with washout surgery on 01/11/2021 -Infection site growing Pseudomonas sensitive to cefepime, continue -Continue linezolid -She was discharged on 01/16/2021 to complete 4 weeks of IV antibiotics -Blood cultures pending  -Per husband she was scheduled to follow-up with infectious disease outpatient on 02/05/2021.   Essential hypertension -BP not at goal, elevated - 6/18 change Metoprolol IV 5 MG TID -6/20 Hydralazine IV 5 mg scheduled  -Resume home oral antihypertensives when no longer NPO -Monitor vital signs closely   Hypothyroidism -6/17 TSH= 33.4 -Switch p.o. Synthroid to Synthroid IV 37.5 mcg daily  -6/17 ADDENDUM: Increased Synthroid IV 75 mcg daily  Hypokalemia - Potassium goal> 4 -6/21 potassium 50 mEq  Hypomagnesmia - Magnesium goal> 2   Hypocalcemia - 6/19 corrected calcium= 9.0 -6/19 repeat corrected calcium= 8.3 - 6/21 corrected calcium= 9.4, continue to monitor closely    Chronic aorto iliac, mesenteric artery calcifications -Resume home Lipitor when no longer NPO  Dysphagia - 6/20 swallow study; passed swallow study.  Diet regular, thin liquid  Anemia - 6/28 per Mr. Cinelli patient had been receiving B12 injections per week unsure of last injection. - 6/21 obtain B12 level in a.m. and if low administer B12 1000 mcg  Goals of care - 6/21 spoke at length with husband and patient concerning the benefits of obtaining SNF for a couple of weeks.  They both stated when ready they would rather be discharged home with home health, PT.       DVT prophylaxis: Subcu heparin Code Status: Full Family Communication: 6/21 husband at bedside discussed plan of care answered all questions Status is: Inpatient    Dispo: The patient is  from: Home              Anticipated d/c is to: Home              Anticipated d/c date is: 6/25              Patient currently unstable      Consultants:  Nephrology   Procedures/Significant Events:  6/17 MRI brain:No acute intracranial abnormality. 2. Generalized volume loss without a clear lobar predilection 6/17 MRI L-spine Interval postoperative changes of L5-S1 posterior and interbody fusion. No appreciable epidural fluid collection, although evaluation is somewhat degraded by motion artifact and lack of postcontrast imaging. 2. Multilevel degenerative changes of the lumbar spine are similar to the prior study. There is moderate canal stenosis at L3-4 and L4-5 and mild canal stenosis at L1-2 and L2-3. 6/17 US renal:Echogenic kidneys consistent with medical renal disease. No hydronephrosis 2. Small free fluid in the right lower quadrant. Trace right pleural effusion.   I have personally reviewed and interpreted all radiology studies and my findings are as above.  VENTILATOR SETTINGS: Room air 6/21 SPO2 95%   Cultures 6/16 SARS coronavirus negative 6/16 influenza A/B negative 6/17 blood pending 6/17 urine negative  Antimicrobials: Anti-infectives (From admission, onward)    Start     Ordered Stop   01/26/21 1245  ceFEPIme (MAXIPIME) 1 g in sodium chloride 0.9 % 100 mL IVPB        01/26/21 1152     01/25/21 1000  ceFEPIme (MAXIPIME) 2 g in sodium chloride 0.9 % 100 mL IVPB  Status:  Discontinued        01/25/21 0527 01/26/21 1152   01/25/21 0100  linezolid (ZYVOX) IVPB 600 mg        01/24/21 2345           Devices    LINES / TUBES:      Continuous Infusions:  sodium chloride 100 mL/hr at 01/29/21 1237   ceFEPime (MAXIPIME) IV 1 g (01/29/21 1033)   potassium chloride 10 mEq (01/29/21 1241)     Objective: Vitals:   01/28/21 0901 01/28/21 2055 01/29/21 0524 01/29/21 1004  BP: (!) 198/63 (!) 171/55 (!) 158/52 (!) 149/44  Pulse: 72 93 84 95   Resp: 17 16 18 17   Temp: 97.7 F (36.5 C) 98.2 F (36.8 C) 97.7 F (36.5 C) 97.8 F (36.6 C)  TempSrc: Oral Oral Oral Oral  SpO2: 95% 95% 96% 94%  Weight:  63.5 kg 66.2 kg   Height:        Intake/Output Summary (Last 24 hours) at 01/29/2021 1316 Last data filed at 01/29/2021 0800 Gross per 24 hour  Intake 2420.28 ml  Output 450 ml  Net 1970.28 ml    Filed Weights   01/28/21 0850 01/28/21 2055 01/29/21 0524  Weight: 63.2 kg 63.5 kg 66.2 kg   Physical Exam:  General: A/O x3 (does not know why) No acute respiratory distress Eyes: negative scleral hemorrhage, negative anisocoria, negative icterus ENT: Negative Runny nose, negative gingival bleeding, Neck:  Negative scars, masses, torticollis, lymphadenopathy, JVD Lungs: Clear to auscultation bilaterally without wheezes or crackles Cardiovascular: Regular rate and rhythm without murmur gallop or rub normal S1 and S2 Abdomen: negative abdominal pain, nondistended, positive soft, bowel sounds, no rebound, no ascites, no appreciable mass Extremities: No significant cyanosis, clubbing, or edema bilateral lower extremities Skin: Negative rashes, lesions, ulcers Psychiatric:  Negative depression, negative anxiety, negative fatigue, negative mania  Central nervous system:  Cranial nerves II through XII intact, tongue/uvula midline, all extremities muscle strength 5/5, sensation intact throughout, negative dysarthria, negative expressive aphasia, negative receptive aphasia.     Data Reviewed: Care during the described time interval was provided by me .  I have reviewed this patient's available data, including medical history, events of note, physical examination, and all test results as part of my evaluation.  CBC: Recent Labs  Lab 01/25/21 0400 01/26/21 0318 01/27/21 0316 01/28/21 0313 01/29/21 0407  WBC 9.7 12.3* 14.1* 13.4* 12.1*  NEUTROABS  --  9.8* 10.9* 10.2* 8.4*  HGB 9.2* 9.2* 9.5* 9.5* 9.3*  HCT 28.5* 28.2* 29.5*  29.4* 29.5*  MCV 99.0 97.9 97.7 98.0 100.3*  PLT 293 297 311 275 284    Basic Metabolic Panel: Recent Labs  Lab 01/25/21 0400 01/25/21 1658 01/26/21 0318 01/26/21 2038 01/27/21 0316 01/27/21 1726 01/28/21 0313 01/29/21 0407  NA 131*   < > 132* 129* 131* 130* 131* 133*  K 3.3*   < > 3.3* 3.0* 3.4* 3.1* 4.0 3.4*  CL 100   < > 98 94* 94* 98 98 103  CO2 17*   < > 22 22 24 23 22  18*  GLUCOSE 222*   < >  242* 258* 187* 167* 116* 78  BUN 51*   < > 54* 53* 50* 45* 44* 45*  CREATININE 4.21*   < > 4.77* 5.05* 4.80* 4.44* 4.44* 4.16*  CALCIUM 8.1*   < > 7.6* 7.2* 7.1* 6.4* 7.4* 7.4*  MG 1.7  --  1.6*  --  1.9  --  1.7 2.0  PHOS 3.6  --  3.2  --  2.6  --  3.2 3.1   < > = values in this interval not displayed.    GFR: Estimated Creatinine Clearance: 10.6 mL/min (A) (by C-G formula based on SCr of 4.16 mg/dL (H)). Liver Function Tests: Recent Labs  Lab 01/26/21 0318 01/27/21 0316 01/28/21 0313 01/29/21 0407  AST 12* 16 15 14*  ALT 8 7 8 9   ALKPHOS 76 74 76 67  BILITOT 0.4 0.1* 0.4 0.5  PROT 5.2* 5.1* 5.1* 4.9*  ALBUMIN 1.7* 1.6* 1.5* 1.4*    No results for input(s): LIPASE, AMYLASE in the last 168 hours. Recent Labs  Lab 01/25/21 1658  AMMONIA 34    Coagulation Profile: No results for input(s): INR, PROTIME in the last 168 hours. Cardiac Enzymes: No results for input(s): CKTOTAL, CKMB, CKMBINDEX, TROPONINI in the last 168 hours. BNP (last 3 results) No results for input(s): PROBNP in the last 8760 hours. HbA1C: No results for input(s): HGBA1C in the last 72 hours. CBG: Recent Labs  Lab 01/29/21 0055 01/29/21 0511 01/29/21 0615 01/29/21 0658 01/29/21 1121  GLUCAP 157* 57* 65* 99 190*    Lipid Profile: No results for input(s): CHOL, HDL, LDLCALC, TRIG, CHOLHDL, LDLDIRECT in the last 72 hours.  Thyroid Function Tests: No results for input(s): TSH, T4TOTAL, FREET4, T3FREE, THYROIDAB in the last 72 hours.  Anemia Panel: No results for input(s): VITAMINB12,  FOLATE, FERRITIN, TIBC, IRON, RETICCTPCT in the last 72 hours. Sepsis Labs: Recent Labs  Lab 01/25/21 0400 01/26/21 0318 01/27/21 0316 01/28/21 0313  PROCALCITON 7.07 8.36 6.67 4.61     Recent Results (from the past 240 hour(s))  Culture, Urine     Status: None   Collection Time: 01/25/21  4:02 PM   Specimen: Urine, Random  Result Value Ref Range Status   Specimen Description URINE, RANDOM  Final   Special Requests NONE  Final   Culture   Final    NO GROWTH Performed at [facility], [address] [place], [place]    Report Status 01/27/2021 FINAL  Final         Radiology Studies: No results found.      Scheduled Meds:  Chlorhexidine Gluconate Cloth  6 each Topical Daily   heparin  5,000 Units Subcutaneous Q8H   hydrALAZINE  5 mg Intravenous Q6H   insulin aspart  0-15 Units Subcutaneous Q4H   insulin detemir  12 Units Subcutaneous QHS   levothyroxine  75 mcg Intravenous Daily   metoprolol tartrate  5 mg Intravenous TID   Continuous Infusions:  sodium chloride 100 mL/hr at 01/29/21 1237   ceFEPime (MAXIPIME) IV 1 g (01/29/21 1033)   potassium chloride 10 mEq (01/29/21 1241)     LOS: 5 days    Time spent:40 min    Joyanna Kleman, 01/31/21, MD [facility]   If 7PM-7AM, please contact night-coverage 01/29/2021, 1:16 PM

## 2021-01-29 NOTE — TOC Progression Note (Signed)
Transition of Care [facility]) - Progression Note    Patient Details  Name: MANJIT BUFANO MRN: [id number] Date of Birth: September 07, 1950  Transition of Care [facility]) CM/SW Contact  Lawerance Sabal, RN Phone Number: 01/29/2021, 12:17 PM  Clinical Narrative:     Sherron Monday w patient and spouse at bedside. They were using [facility] for [facility] services and [facility] for IV Abx. Notified Pam w [facility] of admission, she will follow acutely. Patient verbalized wanting to return home at DC, declining SNF. Spouse at bedside confirmed that plan would be ok with him as well; that he can support her at home.  Patient will need resumption orders for [facility] services, and agencies notified prior to DC.  TOC will continue to follow.    Expected Discharge Plan: Home w Home Health Services Barriers to Discharge: Continued Medical Work up  Expected Discharge Plan and Services Expected Discharge Plan: Home w Home Health Services In-house Referral: Clinical Social Work Discharge Planning Services: CM Consult Post Acute Care Choice: Home Health Living arrangements for the past 2 months: Single Family Home                 DME Arranged: N/A DME Agency: NA       HH Arranged: RN, PT HH [facility] Date [facility] Agency Contacted: 01/28/21 Time HH Agency Contacted: 1045 Representative spoke with at [facility] Agency: Marcelino Duster   Social Determinants of Health (SDOH) Interventions    Readmission Risk Interventions No flowsheet data found.

## 2021-01-30 DIAGNOSIS — E663 Overweight: Secondary | ICD-10-CM | POA: Diagnosis present

## 2021-01-30 DIAGNOSIS — N390 Urinary tract infection, site not specified: Secondary | ICD-10-CM | POA: Diagnosis present

## 2021-01-30 DIAGNOSIS — E1143 Type 2 diabetes mellitus with diabetic autonomic (poly)neuropathy: Secondary | ICD-10-CM | POA: Diagnosis present

## 2021-01-30 DIAGNOSIS — E039 Hypothyroidism, unspecified: Secondary | ICD-10-CM | POA: Diagnosis present

## 2021-01-30 DIAGNOSIS — I10 Essential (primary) hypertension: Secondary | ICD-10-CM | POA: Diagnosis present

## 2021-01-30 DIAGNOSIS — E46 Unspecified protein-calorie malnutrition: Secondary | ICD-10-CM

## 2021-01-30 LAB — COMPREHENSIVE METABOLIC PANEL
ALT: 8 U/L (ref 0–44)
AST: 12 U/L — ABNORMAL LOW (ref 15–41)
Albumin: 1.4 g/dL — ABNORMAL LOW (ref 3.5–5.0)
Alkaline Phosphatase: 64 U/L (ref 38–126)
Anion gap: 9 (ref 5–15)
BUN: 49 mg/dL — ABNORMAL HIGH (ref 8–23)
CO2: 19 mmol/L — ABNORMAL LOW (ref 22–32)
Calcium: 7.9 mg/dL — ABNORMAL LOW (ref 8.9–10.3)
Chloride: 103 mmol/L (ref 98–111)
Creatinine, Ser: 4.04 mg/dL — ABNORMAL HIGH (ref 0.44–1.00)
GFR, Estimated: 11 mL/min — ABNORMAL LOW (ref >60)
Glucose, Bld: 109 mg/dL — ABNORMAL HIGH (ref 70–99)
Potassium: 3.9 mmol/L (ref 3.5–5.1)
Sodium: 131 mmol/L — ABNORMAL LOW (ref 135–145)
Total Bilirubin: 0.4 mg/dL (ref 0.3–1.2)
Total Protein: 4.6 g/dL — ABNORMAL LOW (ref 6.5–8.1)

## 2021-01-30 LAB — CBC WITH DIFFERENTIAL/PLATELET
Abs Immature Granulocytes: 0.06 10*3/uL (ref 0.00–0.07)
Basophils Absolute: 0.2 10*3/uL — ABNORMAL HIGH (ref 0.0–0.1)
Basophils Relative: 2 %
Eosinophils Absolute: 0.3 10*3/uL (ref 0.0–0.5)
Eosinophils Relative: 3 %
HCT: 27.3 % — ABNORMAL LOW (ref 36.0–46.0)
Hemoglobin: 8.4 g/dL — ABNORMAL LOW (ref 12.0–15.0)
Immature Granulocytes: 1 %
Lymphocytes Relative: 15 %
Lymphs Abs: 1.7 10*3/uL (ref 0.7–4.0)
MCH: 31.6 pg (ref 26.0–34.0)
MCHC: 30.8 g/dL (ref 30.0–36.0)
MCV: 102.6 fL — ABNORMAL HIGH (ref 80.0–100.0)
Monocytes Absolute: 1 10*3/uL (ref 0.1–1.0)
Monocytes Relative: 8 %
Neutro Abs: 8.4 10*3/uL — ABNORMAL HIGH (ref 1.7–7.7)
Neutrophils Relative %: 71 %
Platelets: 244 10*3/uL (ref 150–400)
RBC: 2.66 MIL/uL — ABNORMAL LOW (ref 3.87–5.11)
RDW: 14.6 % (ref 11.5–15.5)
WBC: 11.7 10*3/uL — ABNORMAL HIGH (ref 4.0–10.5)
nRBC: 0 % (ref 0.0–0.2)

## 2021-01-30 LAB — GLUCOSE, CAPILLARY
Glucose-Capillary: 105 mg/dL — ABNORMAL HIGH (ref 70–99)
Glucose-Capillary: 106 mg/dL — ABNORMAL HIGH (ref 70–99)
Glucose-Capillary: 143 mg/dL — ABNORMAL HIGH (ref 70–99)
Glucose-Capillary: 190 mg/dL — ABNORMAL HIGH (ref 70–99)
Glucose-Capillary: 195 mg/dL — ABNORMAL HIGH (ref 70–99)
Glucose-Capillary: 210 mg/dL — ABNORMAL HIGH (ref 70–99)

## 2021-01-30 LAB — PHOSPHORUS: Phosphorus: 3.3 mg/dL (ref 2.5–4.6)

## 2021-01-30 LAB — VITAMIN B12: Vitamin B-12: 2768 pg/mL — ABNORMAL HIGH (ref 180–914)

## 2021-01-30 LAB — MAGNESIUM: Magnesium: 1.9 mg/dL (ref 1.7–2.4)

## 2021-01-30 MED ORDER — FUROSEMIDE 10 MG/ML IJ SOLN
80.0000 mg | Freq: Two times a day (BID) | INTRAMUSCULAR | Status: DC
  Administered 2021-01-30 – 2021-02-04 (×11): 80 mg via INTRAVENOUS
  Filled 2021-01-30 (×12): qty 8

## 2021-01-30 NOTE — Care Management Important Message (Signed)
Important Message  Patient Details  Name: Ariana Wilkinson MRN: [id number] Date of Birth: 11/02/1950   Medicare Important Message Given:  Yes     Oralia Rud [name] 01/30/2021, 2:51 PM

## 2021-01-30 NOTE — Progress Notes (Signed)
[facility] ROUNDING NOTE   Subjective:   Interval History: This is a 70 year old lady with history of diabetes mellitus type 1.  Status post lumbar fusion L5-S1 12/26/2020.  Readmitted for infection abscess and washout 01/11/2021.  Discharged 01/16/2021.  Presented to [facility] 01/23/2021 with an elevated creatinine.  Baseline creatinine about 1.2 to 1.4 mg/dL.  Suspected sepsis UTI.  No hematuria proteinuria.  Suspected volume depletion getting IV fluids.  Creatinine slightly improved.    Eating  morning husband at bedside updated patient's husband with progress.  Still swollen  Urine output 650 cc 01/29/2021  Blood pressure 153/42 pulse 96 temperature 97.5 O2 sats 96% room air  Sodium 131 potassium 3.9 chloride 103 CO2 19 BUN 49 creatinine 4 glucose 109 calcium 7.9 albumin 1.4 hemoglobin 8.4  Objective:  Vital signs in last 24 hours:  Temp:  [97.5 F (36.4 C)-98.5 F (36.9 C)] 97.5 F (36.4 C) (06/22 0502) Pulse Rate:  [80-96] 96 (06/22 0502) Resp:  [17-18] 17 (06/22 0502) BP: (130-153)/(42-45) 153/42 (06/22 0502) SpO2:  [95 %-96 %] 96 % (06/22 0502) Weight:  [64.5 kg-70.7 kg] 64.5 kg (06/22 0619)  Weight change: 7.5 kg Filed Weights   01/29/21 0524 01/30/21 0223 01/30/21 0619  Weight: 66.2 kg 70.7 kg 64.5 kg    Intake/Output: I/O last 3 completed shifts: In: 3016 [P.O.:960; I.V.:2056] Out: 800 [Urine:800]   Intake/Output this shift:  No intake/output data recorded.  Gen no distress, a little more receptive to noise, pt is blind No rash, cyanosis or gangrene Sclera anicteric, throat clear No jvd or bruits Chest clear bilat to bases, no rales/ wheezing RRR no MRG Abd soft ntnd no mass or ascites +bs GU foley cath draining light yellow clear urine MS no joint effusions or deformity Ext no LE or UE edema, no wounds or ulcers Neuro is generally quite weak, not following commands   Basic Metabolic Panel: Recent Labs  Lab 01/26/21 0318  01/26/21 2038 01/27/21 0316 01/27/21 1726 01/28/21 0313 01/29/21 0407 01/30/21 0400  NA 132*   < > 131* 130* 131* 133* 131*  K 3.3*   < > 3.4* 3.1* 4.0 3.4* 3.9  CL 98   < > 94* 98 98 103 103  CO2 22   < > 24 23 22  18* 19*  GLUCOSE 242*   < > 187* 167* 116* 78 109*  BUN 54*   < > 50* 45* 44* 45* 49*  CREATININE 4.77*   < > 4.80* 4.44* 4.44* 4.16* 4.04*  CALCIUM 7.6*   < > 7.1* 6.4* 7.4* 7.4* 7.9*  MG 1.6*  --  1.9  --  1.7 2.0 1.9  PHOS 3.2  --  2.6  --  3.2 3.1 3.3   < > = values in this interval not displayed.     Liver Function Tests: Recent Labs  Lab 01/26/21 0318 01/27/21 0316 01/28/21 0313 01/29/21 0407 01/30/21 0400  AST 12* 16 15 14* 12*  ALT 8 7 8 9 8   ALKPHOS 76 74 76 67 64  BILITOT 0.4 0.1* 0.4 0.5 0.4  PROT 5.2* 5.1* 5.1* 4.9* 4.6*  ALBUMIN 1.7* 1.6* 1.5* 1.4* 1.4*    No results for input(s): LIPASE, AMYLASE in the last 168 hours. Recent Labs  Lab 01/25/21 1658  AMMONIA 34     CBC: Recent Labs  Lab 01/26/21 0318 01/27/21 0316 01/28/21 0313 01/29/21 0407 01/30/21 0400  WBC 12.3* 14.1* 13.4* 12.1* 11.7*  NEUTROABS 9.8* 10.9* 10.2* 8.4* 8.4*  HGB 9.2* 9.5* 9.5* 9.3* 8.4*  HCT 28.2* 29.5* 29.4* 29.5* 27.3*  MCV 97.9 97.7 98.0 100.3* 102.6*  PLT 297 311 275 284 244     Cardiac Enzymes: No results for input(s): CKTOTAL, CKMB, CKMBINDEX, TROPONINI in the last 168 hours.  BNP: Invalid input(s): POCBNP  CBG: Recent Labs  Lab 01/29/21 1653 01/29/21 2036 01/30/21 0025 01/30/21 0447 01/30/21 0807  GLUCAP 179* 162* 143* 106* 105*     Microbiology: Results for orders placed or performed during the hospital encounter of 01/24/21  Culture, Urine     Status: None   Collection Time: 01/25/21  4:02 PM   Specimen: Urine, Random  Result Value Ref Range Status   Specimen Description URINE, RANDOM  Final   Special Requests NONE  Final   Culture   Final    NO GROWTH Performed at [facility], [address], [place], [place]  [address]    Report Status 01/27/2021 FINAL  Final    Coagulation Studies: No results for input(s): LABPROT, INR in the last 72 hours.  Urinalysis: No results for input(s): COLORURINE, LABSPEC, PHURINE, GLUCOSEU, HGBUR, BILIRUBINUR, KETONESUR, PROTEINUR, UROBILINOGEN, NITRITE, LEUKOCYTESUR in the last 72 hours.  Invalid input(s): APPERANCEUR     Imaging: No results found.   Medications:    ceFEPime (MAXIPIME) IV 1 g (01/30/21 1029)    Chlorhexidine Gluconate Cloth  6 each Topical Daily   furosemide  80 mg Intravenous Q12H   heparin  5,000 Units Subcutaneous Q8H   hydrALAZINE  5 mg Intravenous Q6H   insulin aspart  0-15 Units Subcutaneous Q4H   insulin detemir  12 Units Subcutaneous QHS   levothyroxine  75 mcg Intravenous Daily   metoprolol tartrate  5 mg Intravenous TID   acetaminophen, HYDROmorphone (DILAUDID) injection, ondansetron (ZOFRAN) IV, oxyCODONE, senna-docusate, sodium chloride flush  Assessment/ Plan:  Acute kidney injury Baseline serum creatinine 1.2 to 1.4 mg deciliter increasing creatinine on admission.  Making urine.  An active urine sediment.  Foley catheter in place.  Creatinine appears a little improved this morning hopefully will plateaued. Appears to be markedly volume overloaded.  We will start IV Lasix 80 mg every 12 hours Recent lumbar fusion treated with Zyvox and cefepime for 4 weeks.  Complicated by postop infection.  Positive for Pseudomonas antibiotics now stopped. Diabetes mellitus type 1 Long history. Hypertension blood pressure controlled Hypothyroidism on replacement therapy Hypokalemia will replete   LOS: 6 Ariana Wilkinson @TODAY @11 :11 AM

## 2021-01-30 NOTE — Progress Notes (Signed)
  Speech Language Pathology Treatment: Dysphagia  Patient Details Name: Ariana Wilkinson MRN: [id number] DOB: [birth year] Today's Date: 01/30/2021 Time: 2863-8177 SLP Time Calculation (min) (ACUTE ONLY): 18 min  Assessment / Plan / Recommendation Clinical Impression  Pt fully alert today and directing her husband as to what to feed her.  Observed spouse providing pt with soda via straw - no indication of dysphagia or aspiration but deep exhalation post=swallow.  Pt reports intake is poor due to her lack of appetite..  Incidentally noted pt with scarring anterior neck - and she confirms she had a trach when she was a child due to aspirating a peanut - denies any dysphagia as a result.  Endorses GERD for which she takes Protonix at home PRN - does not appear to be in her medical list at this time.   Advised pt and spouse to general aspiration and reflux precautions including alternative ways to take medications if note deficits or mildly sleepy.  All education completed and mentation is near baseline and thus acute dysphagia has resolved .  HPI HPI: Pt is a 70 y.o. female who presented to the ED on 01/23/2021 for acute confusion and AKI. MRI 6/17 negative. CXR on admission: Persistent bandlike opacities, likely subsegmental atelectasis  and/or scarring. Dx acute metabolic encephalopathy. SLP consulted due to improvement in encephalopathy and question of whether a p.o. diet may be initiated. PMH: critical blindness, DM type 1 diabetes on insulin pump, s/p Lumbar fusion surgery at L5-S1 on 12/26/2020, readmitted for post infection/abscess with washout surgery on 01/11/2021, infection site growing Pseudomonas sensitive to cefepime, discharged on 01/16/2021 to complete 4 weeks of IV antibiotics.      SLP Plan  All goals met       Recommendations  Diet recommendations: Regular;Thin liquid Liquids provided via: Cup;Straw Medication Administration: Whole meds with puree Supervision:  (spouse feeds  pt) Compensations: Minimize environmental distractions;Slow rate Postural Changes and/or Swallow Maneuvers: Upright 30-60 min after meal;Seated upright 90 degrees                Oral Care Recommendations: Oral care BID Follow up Recommendations:  (TBD) SLP Visit Diagnosis: Dysphagia, unspecified (R13.10) Plan: All goals met       GO               Ariana Lime, MS [facility] SLP Acute Rehab Services Office [phone] Pager [phone]  Ariana Wilkinson 01/30/2021, 9:01 AM

## 2021-01-30 NOTE — Progress Notes (Signed)
PROGRESS NOTE  Ariana Wilkinson DJT:[id number] DOB: Jul 30, 1951 DOA: 01/24/2021 PCP: Barbette Reichmann, MD  HPI/Recap of past 66 hours: 70 year old female with past medical history of diabetes mellitus with insulin pump with peripheral neuropathy and hypertension who underwent a lumbar fusion surgery on 5/18 readmitted on 6/3 for postinfection abscess with infection site growing out Pseudomonas and discharged on 6/8, presented to the emergency room on 6/15 at [facility] with creatinine of 4.2 and CT of abdomen noting post renal obstruction from distended bladder.  Given lack of nephrology services at [facility], patient transferred to [facility] to [facility] on 6/16.  Nephrology consulted.  Patient's creatinine peaked at 5.05 on 6/18.  Patient's acute kidney injury felt to be multifactorial from urinary retention from obstruction as well as poor p.o. intake plus large urinary tract infection.  Has been responding to bicarb and fluids plus antibiotics with creatinine slowly improving.  Creatinine today down to 4.04.  Patient doing okay, still quite fatigued.  Little appetite.  Assessment/Plan: Principal Problem:   AKI (acute kidney injury) [facility]): Slowly improving.  Appreciate nephrology help.  Continue fluids and antibiotics.  Has diuresed approximately 2 L.  Nephrology to start IV Lasix. Active Problems:   Acute urinary retention   Metabolic acidosis, normal anion gap (NAG)   Acute metabolic encephalopathy: Likely uremia.  Seems to have much improved as renal function has as well.  Hypothyroidism: On Synthroid    Overweight (BMI 25.0-29.9): Patient meets criteria BMI greater than 25.    Diabetes mellitus with peripheral autonomic neuropathy (HCC): On pump.  Noting that patient's A1c is rather low at 4.8.    Essential hypertension: Blood pressures ranging from 130s to 150s.    Unspecified protein-calorie malnutrition [facility]): Have asked nutrition for consult.  Urinary tract  infection: Urine cultures with no growth although patient had been on IV antibiotics from previous hospitalization.  Continue cefepime.  Code Status: Full code  Family Communication: Husband at the bedside  Disposition Plan: Likely would benefit from skilled nursing, but husband is more insistent on home with home health upon discharge.   Consultants: Nephrology  Procedures: None  Antimicrobials: IV cefepime 6/16-present  DVT prophylaxis: SCDs  Level of care: Telemetry Medical   Objective: Vitals:   01/30/21 0502 01/30/21 1100  BP: (!) 153/42 (!) 149/60  Pulse: 96 92  Resp: 17 17  Temp: (!) 97.5 F (36.4 C) 97.7 F (36.5 C)  SpO2: 96% 98%    Intake/Output Summary (Last 24 hours) at 01/30/2021 1526 Last data filed at 01/30/2021 0700 Gross per 24 hour  Intake 1914.32 ml  Output 150 ml  Net 1764.32 ml   Filed Weights   01/29/21 0524 01/30/21 0223 01/30/21 0619  Weight: 66.2 kg 70.7 kg 64.5 kg   Body mass index is 28.72 kg/m.  Exam:  General: Alert and oriented x2, no acute distress, fatigued HEENT: Normocephalic and atraumatic, mucous membranes are slightly dry Cardiovascular: Regular rate and rhythm, S1-S2 no rubs Respiratory: Clear to auscultation bilaterally Abdomen: Soft, obese, nontender, positive bowel sounds Musculoskeletal: No clubbing or cyanosis, 2+ pitting edema with some weeping Skin: No skin breaks, tears or lesions Psychiatry: Appropriate, no evidence of psychoses Neurology: Decreased chronic peripheral neuropathy, otherwise no focal deficits   Data Reviewed: CBC: Recent Labs  Lab 01/26/21 0318 01/27/21 0316 01/28/21 0313 01/29/21 0407 01/30/21 0400  WBC 12.3* 14.1* 13.4* 12.1* 11.7*  NEUTROABS 9.8* 10.9* 10.2* 8.4* 8.4*  HGB 9.2* 9.5* 9.5* 9.3* 8.4*  HCT 28.2* 29.5*  29.4* 29.5* 27.3*  MCV 97.9 97.7 98.0 100.3* 102.6*  PLT 297 311 275 284 244   Basic Metabolic Panel: Recent Labs  Lab 01/26/21 0318 01/26/21 2038 01/27/21 0316  01/27/21 1726 01/28/21 0313 01/29/21 0407 01/30/21 0400  NA 132*   < > 131* 130* 131* 133* 131*  K 3.3*   < > 3.4* 3.1* 4.0 3.4* 3.9  CL 98   < > 94* 98 98 103 103  CO2 22   < > 24 23 22  18* 19*  GLUCOSE 242*   < > 187* 167* 116* 78 109*  BUN 54*   < > 50* 45* 44* 45* 49*  CREATININE 4.77*   < > 4.80* 4.44* 4.44* 4.16* 4.04*  CALCIUM 7.6*   < > 7.1* 6.4* 7.4* 7.4* 7.9*  MG 1.6*  --  1.9  --  1.7 2.0 1.9  PHOS 3.2  --  2.6  --  3.2 3.1 3.3   < > = values in this interval not displayed.   GFR: Estimated Creatinine Clearance: 10.7 mL/min (A) (by C-G formula based on SCr of 4.04 mg/dL (H)). Liver Function Tests: Recent Labs  Lab 01/26/21 0318 01/27/21 0316 01/28/21 0313 01/29/21 0407 01/30/21 0400  AST 12* 16 15 14* 12*  ALT 8 7 8 9 8   ALKPHOS 76 74 76 67 64  BILITOT 0.4 0.1* 0.4 0.5 0.4  PROT 5.2* 5.1* 5.1* 4.9* 4.6*  ALBUMIN 1.7* 1.6* 1.5* 1.4* 1.4*   No results for input(s): LIPASE, AMYLASE in the last 168 hours. Recent Labs  Lab 01/25/21 1658  AMMONIA 34   Coagulation Profile: No results for input(s): INR, PROTIME in the last 168 hours. Cardiac Enzymes: No results for input(s): CKTOTAL, CKMB, CKMBINDEX, TROPONINI in the last 168 hours. BNP (last 3 results) No results for input(s): PROBNP in the last 8760 hours. HbA1C: No results for input(s): HGBA1C in the last 72 hours. CBG: Recent Labs  Lab 01/29/21 2036 01/30/21 0025 01/30/21 0447 01/30/21 0807 01/30/21 1201  GLUCAP 162* 143* 106* 105* 195*   Lipid Profile: No results for input(s): CHOL, HDL, LDLCALC, TRIG, CHOLHDL, LDLDIRECT in the last 72 hours. Thyroid Function Tests: No results for input(s): TSH, T4TOTAL, FREET4, T3FREE, THYROIDAB in the last 72 hours. Anemia Panel: Recent Labs    01/30/21 0400  VITAMINB12 2,768*   Urine analysis:    Component Value Date/Time   COLORURINE YELLOW 01/25/2021 1602   APPEARANCEUR CLOUDY (A) 01/25/2021 1602   LABSPEC 1.018 01/25/2021 1602   PHURINE 5.0  01/25/2021 1602   GLUCOSEU NEGATIVE 01/25/2021 1602   HGBUR SMALL (A) 01/25/2021 1602   BILIRUBINUR NEGATIVE 01/25/2021 1602   KETONESUR 5 (A) 01/25/2021 1602   PROTEINUR 100 (A) 01/25/2021 1602   NITRITE NEGATIVE 01/25/2021 1602   LEUKOCYTESUR LARGE (A) 01/25/2021 1602   Sepsis Labs: @LABRCNTIP (procalcitonin:4,lacticidven:4)  ) Recent Results (from the past 240 hour(s))  Culture, Urine     Status: None   Collection Time: 01/25/21  4:02 PM   Specimen: Urine, Random  Result Value Ref Range Status   Specimen Description URINE, RANDOM  Final   Special Requests NONE  Final   Culture   Final    NO GROWTH Performed at [facility], [address], [place], [place] [address]    Report Status 01/27/2021 FINAL  Final      Studies: No results found.  Scheduled Meds:  Chlorhexidine Gluconate Cloth  6 each Topical Daily   furosemide  80 mg Intravenous Q12H  heparin  5,000 Units Subcutaneous Q8H   hydrALAZINE  5 mg Intravenous Q6H   insulin aspart  0-15 Units Subcutaneous Q4H   insulin detemir  12 Units Subcutaneous QHS   levothyroxine  75 mcg Intravenous Daily   metoprolol tartrate  5 mg Intravenous TID    Continuous Infusions:  ceFEPime (MAXIPIME) IV 1 g (01/30/21 1029)     LOS: 6 days     Hollice Espy, MD [facility]   01/30/2021, 3:26 PM

## 2021-01-30 NOTE — Progress Notes (Signed)
Occupational Therapy Treatment Patient Details Name: Ariana Wilkinson MRN: [id number] DOB: [birth year] Today's Date: 01/30/2021    History of present illness 70 yo female presenting to [facility] health ED on 6/15 with confusion and AKI. PMH including critical blindness, type 1 diabetes on insulin pump, s/p lumbar fusion surgery at L5-S1 on 12/26/2020 by Dr. [name], readmitted for post infection/abscess with washout surgery on 01/11/2021, infection site growing Pseudomonas sensitive to cefepime, discharged on 01/16/2021 to complete 4 weeks of IV antibiotics.   OT comments  Patient progressing and showed improved overall ADL performance as evidenced by increased AM-PAC 6-Clicks Occupational performance measure with current score of 10/24 compared to previous session where pt scored 6/24 meaning 100% dependent. Patient remains limited by edematous extremities, generalized weakness and unsteadiness and impaired activity tolerance along with deficits noted below. Pt continues to demonstrate good rehab potential and would benefit from continued skilled OT to increase safety and independence with ADLs and functional transfers to allow pt to return home safely and reduce caregiver burden and fall risk.   Follow Up Recommendations  Home health OT;Supervision/Assistance - 24 hour (Husband continues to refuse SNF)    Equipment Recommendations       Recommendations for Other Services      Precautions / Restrictions Precautions Precautions: Fall;Back Precaution Booklet Issued: No Precaution Comments: no specific order for back precautions Restrictions Weight Bearing Restrictions: No       Mobility Bed Mobility Overal bed mobility: Needs Assistance Bed Mobility: Rolling;Sidelying to Sit Rolling: Mod assist Sidelying to sit: Max assist       General bed mobility comments: Pt's husband lifted pt's trunk from bed, not really giving pt opportunity to try. Pt required Mod As with hand over hand and verbal  cues for grasping bed rail and using log roll technique.    Transfers Overall transfer level: Needs assistance Equipment used: Rolling walker (2 wheeled) Transfers: Sit to/from [employer] Sit to Stand: Min assist (x3 reps for cleaning/hygiene.) Stand pivot transfers: Min assist (with RW)            Balance Overall balance assessment: Needs assistance Sitting-balance support: No upper extremity supported;Feet supported Sitting balance-Leahy Scale: Fair Sitting balance - Comments: Pt sits within her pelvis at EOB.     Standing balance-Leahy Scale: Poor Standing balance comment: B UE support on walker and +1 min assist                           ADL either performed or assessed with clinical judgement   ADL Overall ADL's : Needs assistance/impaired     Grooming: Wash/dry hands;Sitting;Set up Grooming Details (indicate cue type and reason): Verbal cues. Washcloth placed in hand as pt blind.     Lower Body Bathing: Total assistance;Sitting/lateral leans;Sit to/from stand Lower Body Bathing Details (indicate cue type and reason): Pt with large bowel movement at bed level discovered once pt EOB as pt was unaware.  Pt required Total As for bathing to BLEs with pt sitting and standing while holding RW. Upper Body Dressing : Maximal assistance;Sitting Upper Body Dressing Details (indicate cue type and reason): Doffed soiled gown with snaps. Pt needed cues to raise each UE to thread through sleeves of fresh gown while sitting EOB. Lower Body Dressing: Sitting/lateral leans;Total assistance Lower Body Dressing Details (indicate cue type and reason): Total As to doff soiled socks and don fresh socks. Cues to kick feet to assist with pt in supportive chair.  Toilet Transfer: Minimal assistance;RW;Stand-pivot Statistician Details (indicate cue type and reason): Pt transfered to armchair (not recliner) with RW and Min As. Verbal cues for sequence due to  blindness. Toileting- Clothing Manipulation and Hygiene: Total assistance;Sitting/lateral lean;Sit to/from stand Toileting - Clothing Manipulation Details (indicate cue type and reason): Please see LE bathing above.             Vision Baseline Vision/History: Legally blind     Perception     Praxis      Cognition Arousal/Alertness: Awake/alert Behavior During Therapy: WFL for tasks assessed/performed Overall Cognitive Status: Difficult to assess (Husband rather anxious and voicing most needs for pt. Husband repeats instructions to pt often who is following well today.)                   Orientation Level:  (did not foramlly assess this visit. Pt following 1-2 step instructions consistently.)                      Exercises Other Exercises Other Exercises: Pt with edematous BUEs. Pt shown how to rest forearm on top of head and performed 20 fist pumpts to each hand with supervision cues. Pt/spouse instructed to have each UE on 2 pillows at rest.   Shoulder Instructions       General Comments      Pertinent Vitals/ Pain       Pain Assessment: No/denies pain  Home Living                                          Prior Functioning/Environment              Frequency  Min 3X/week        Progress Toward Goals  OT Goals(current goals can now be found in the care plan section)  Progress towards OT goals: Progressing toward goals  Acute Rehab OT Goals Patient Stated Goal: to take her home OT Goal Formulation: With family Time For Goal Achievement: 02/08/21 Potential to Achieve Goals: Good  Plan Discharge plan remains appropriate    Co-evaluation                 AM-PAC OT "6 Clicks" Daily Activity     Outcome Measure   Help from another person eating meals?: A Lot Help from another person taking care of personal grooming?: A Lot Help from another person toileting, which includes using toliet, bedpan, or urinal?:  Total Help from another person bathing (including washing, rinsing, drying)?: A Lot Help from another person to put on and taking off regular upper body clothing?: A Lot Help from another person to put on and taking off regular lower body clothing?: Total 6 Click Score: 10    End of Session Equipment Utilized During Treatment: Gait belt;Rolling walker  OT Visit Diagnosis: Unsteadiness on feet (R26.81);Other abnormalities of gait and mobility (R26.89);Muscle weakness (generalized) (M62.81)   Activity Tolerance Patient tolerated treatment well   Patient Left in chair;with family/visitor present;with call bell/phone within reach   Nurse Communication Mobility status (Large bowel movemetn and need for EVS to room)        Time: 7867-6720 OT Time Calculation (min): 44 min  Charges: OT General Charges $OT Visit: 1 Visit OT Treatments $Self Care/Home Management : 23-37 mins $Therapeutic Activity: 8-22 mins  Victorino Dike, OT Acute Rehab Services Office: [phone] 01/30/2021  Theodoro Clock 01/30/2021, 1:03 PM

## 2021-01-30 NOTE — Plan of Care (Signed)

## 2021-01-31 ENCOUNTER — Inpatient Hospital Stay (HOSPITAL_COMMUNITY): Payer: Medicare Other

## 2021-01-31 LAB — URINALYSIS, ROUTINE W REFLEX MICROSCOPIC
Bilirubin Urine: NEGATIVE
Glucose, UA: NEGATIVE mg/dL
Ketones, ur: NEGATIVE mg/dL
Leukocytes,Ua: NEGATIVE
Nitrite: NEGATIVE
Protein, ur: 30 mg/dL — AB
Specific Gravity, Urine: 1.008 (ref 1.005–1.030)
pH: 5 (ref 5.0–8.0)

## 2021-01-31 LAB — PROCALCITONIN: Procalcitonin: 3.98 ng/mL

## 2021-01-31 LAB — BASIC METABOLIC PANEL
Anion gap: 8 (ref 5–15)
BUN: 50 mg/dL — ABNORMAL HIGH (ref 8–23)
CO2: 20 mmol/L — ABNORMAL LOW (ref 22–32)
Calcium: 8.3 mg/dL — ABNORMAL LOW (ref 8.9–10.3)
Chloride: 105 mmol/L (ref 98–111)
Creatinine, Ser: 4.03 mg/dL — ABNORMAL HIGH (ref 0.44–1.00)
GFR, Estimated: 11 mL/min — ABNORMAL LOW (ref >60)
Glucose, Bld: 136 mg/dL — ABNORMAL HIGH (ref 70–99)
Potassium: 3.7 mmol/L (ref 3.5–5.1)
Sodium: 133 mmol/L — ABNORMAL LOW (ref 135–145)

## 2021-01-31 LAB — CBC WITH DIFFERENTIAL/PLATELET
Abs Immature Granulocytes: 0.07 10*3/uL (ref 0.00–0.07)
Basophils Absolute: 0.2 10*3/uL — ABNORMAL HIGH (ref 0.0–0.1)
Basophils Relative: 1 %
Eosinophils Absolute: 0.3 10*3/uL (ref 0.0–0.5)
Eosinophils Relative: 3 %
HCT: 27.6 % — ABNORMAL LOW (ref 36.0–46.0)
Hemoglobin: 8.7 g/dL — ABNORMAL LOW (ref 12.0–15.0)
Immature Granulocytes: 1 %
Lymphocytes Relative: 14 %
Lymphs Abs: 2 10*3/uL (ref 0.7–4.0)
MCH: 31.5 pg (ref 26.0–34.0)
MCHC: 31.5 g/dL (ref 30.0–36.0)
MCV: 100 fL (ref 80.0–100.0)
Monocytes Absolute: 1.1 10*3/uL — ABNORMAL HIGH (ref 0.1–1.0)
Monocytes Relative: 8 %
Neutro Abs: 10 10*3/uL — ABNORMAL HIGH (ref 1.7–7.7)
Neutrophils Relative %: 73 %
Platelets: 233 10*3/uL (ref 150–400)
RBC: 2.76 MIL/uL — ABNORMAL LOW (ref 3.87–5.11)
RDW: 14.7 % (ref 11.5–15.5)
WBC: 13.6 10*3/uL — ABNORMAL HIGH (ref 4.0–10.5)
nRBC: 0 % (ref 0.0–0.2)

## 2021-01-31 LAB — GLUCOSE, CAPILLARY
Glucose-Capillary: 117 mg/dL — ABNORMAL HIGH (ref 70–99)
Glucose-Capillary: 181 mg/dL — ABNORMAL HIGH (ref 70–99)
Glucose-Capillary: 246 mg/dL — ABNORMAL HIGH (ref 70–99)
Glucose-Capillary: 256 mg/dL — ABNORMAL HIGH (ref 70–99)
Glucose-Capillary: 73 mg/dL (ref 70–99)
Glucose-Capillary: 85 mg/dL (ref 70–99)

## 2021-01-31 LAB — PHOSPHORUS: Phosphorus: 3.8 mg/dL (ref 2.5–4.6)

## 2021-01-31 LAB — MAGNESIUM: Magnesium: 1.9 mg/dL (ref 1.7–2.4)

## 2021-01-31 MED ORDER — SODIUM CHLORIDE 0.9 % IV SOLN
1.0000 g | INTRAVENOUS | Status: DC
  Administered 2021-02-01 – 2021-02-08 (×8): 1 g via INTRAVENOUS
  Filled 2021-01-31 (×8): qty 1

## 2021-01-31 NOTE — Progress Notes (Signed)
Physical Therapy Treatment Patient Details Name: Ariana Wilkinson MRN: [id number] DOB: Jun 06, 1951 Today's Date: 01/31/2021    History of Present Illness 70 yo female presenting to [facility] health ED on 6/15 with confusion and AKI. PMH including critical blindness, type 1 diabetes on insulin pump, s/p lumbar fusion surgery at L5-S1 on 12/26/2020 by Dr. [name], readmitted for post infection/abscess with washout surgery on 01/11/2021, infection site growing Pseudomonas sensitive to cefepime, discharged on 01/16/2021 to complete 4 weeks of IV antibiotics.    PT Comments    Pt working with dietitian while PT found recliner to facilitate straight line ambulation in hallway. Pt with appetite for Ensure drinking some before ambulation and requesting it back after ambulation. Discussed with pt that ambulation in hallway would be much easier given pt visual deficits. Pt and husband agreeable to attempt. Pt is making marked improvement today able to come to EoB with min guard for safety, min A for transfers and min A for ambulation of 30 feet with RW. D/c plans remain appropriate at this time. PT will continue to follow acutely.   Follow Up Recommendations  Home health PT;Supervision/Assistance - 24 hour     Equipment Recommendations  Wheelchair (measurements PT);Wheelchair cushion (measurements PT)       Precautions / Restrictions Precautions Precautions: Fall;Back Precaution Booklet Issued: No Precaution Comments: no specific order for back precautions Restrictions Weight Bearing Restrictions: No    Mobility  Bed Mobility Overal bed mobility: Needs Assistance Bed Mobility: Sidelying to Sit   Sidelying to sit: Min guard       General bed mobility comments: min guard for coming to EoB    Transfers Overall transfer level: Needs assistance Equipment used: Rolling walker (2 wheeled) Transfers: Sit to/from Stand Sit to Stand: Min assist Stand pivot transfers: Min assist       General  transfer comment: contact guard assist for power up from bed and pivoting to recliner on L, PT placed hand on armrest to aid in conceptualization of chair location, despite vc pt with decreased eccentric control with descent to chair at end of ambulation  Ambulation/Gait Ambulation/Gait assistance: +2 safety/equipment;Min assist Gait Distance (Feet): 30 Feet Assistive device: Rolling walker (2 wheeled) Gait Pattern/deviations: Step-through pattern;Decreased step length - right;Decreased step length - left;Shuffle;Trunk flexed Gait velocity: slowed Gait velocity interpretation: <1.31 ft/sec, indicative of household ambulator General Gait Details: min physical A for steadying pt and guiding RW for straightline ambulation in hallway, pt's husband pushed IV pole       Balance Overall balance assessment: Needs assistance Sitting-balance support: No upper extremity supported;Feet supported Sitting balance-Leahy Scale: Fair Sitting balance - Comments: sitting on BSC on entry   Standing balance support: Bilateral upper extremity supported;During functional activity Standing balance-Leahy Scale: Poor Standing balance comment: B UE support on walker                            Cognition Arousal/Alertness: Awake/alert Behavior During Therapy: WFL for tasks assessed/performed Overall Cognitive Status: Impaired/Different from baseline Area of Impairment: Orientation;Memory;Following commands;Safety/judgement;Awareness;Problem solving;Attention                 Orientation Level: Disoriented to;Place;Time;Situation Current Attention Level: Sustained Memory: Decreased short-term memory;Decreased recall of precautions Following Commands: Follows one step commands inconsistently;Follows one step commands with increased time Safety/Judgement: Decreased awareness of deficits Awareness: Intellectual Problem Solving: Slow processing;Decreased initiation;Difficulty sequencing;Requires  verbal cues;Requires tactile cues General Comments: pt with more communication today, and better  command follow         General Comments General comments (skin integrity, edema, etc.): husband present and very helpful during session, dietian in room and provided pt with Ensure, pt drank some before ambulation and was eager to finish up with return to room      Pertinent Vitals/Pain Pain Assessment: Faces Faces Pain Scale: Hurts a little bit Pain Location: Back Pain Descriptors / Indicators: Discomfort;Grimacing;Moaning Pain Intervention(s): Limited activity within patient's tolerance;Monitored during session;Repositioned     PT Goals (current goals can now be found in the care plan section) Acute Rehab PT Goals Patient Stated Goal: to take her home PT Goal Formulation: With family Time For Goal Achievement: 02/08/21 Potential to Achieve Goals: Fair Progress towards PT goals: Progressing toward goals    Frequency    Min 3X/week      PT Plan Equipment recommendations need to be updated;Discharge plan needs to be updated       AM-PAC PT "6 Clicks" Mobility   Outcome Measure  Help needed turning from your back to your side while in a flat bed without using bedrails?: A Little Help needed moving from lying on your back to sitting on the side of a flat bed without using bedrails?: A Little Help needed moving to and from a bed to a chair (including a wheelchair)?: A Little Help needed standing up from a chair using your arms (e.g., wheelchair or bedside chair)?: A Little Help needed to walk in hospital room?: A Little Help needed climbing 3-5 steps with a railing? : A Lot 6 Click Score: 17    End of Session Equipment Utilized During Treatment: Gait belt Activity Tolerance: Patient limited by lethargy Patient left: with call bell/phone within reach;with family/visitor present;in chair Nurse Communication: Mobility status PT Visit Diagnosis: Unsteadiness on feet  (R26.81);Other abnormalities of gait and mobility (R26.89)     Time: 7782-4235 PT Time Calculation (min) (ACUTE ONLY): 36 min  Charges:  $Gait Training: 8-22 mins $Therapeutic Activity: 8-22 mins                     Kainen Struckman B. Beverely Risen PT, DPT Acute Rehabilitation Services Pager [phone] Office [phone]    Elon Alas [facility] 01/31/2021, 3:47 PM

## 2021-01-31 NOTE — Progress Notes (Signed)
Initial Nutrition Assessment  DOCUMENTATION CODES:   Not applicable (suspect malnutrition but unable to confirm with NFPE due to fluid overload)  INTERVENTION:   - Ensure Enlive po TID, each supplement provides 350 kcal and 20 grams of protein  - Magic Cup TID with meals, each supplement provides 290 kcal and 9 grams of protein  - Encourage PO intake  NUTRITION DIAGNOSIS:   Inadequate oral intake related to nausea, vomiting as evidenced by per patient/family report.  GOAL:   Patient will meet greater than or equal to 90% of their needs  MONITOR:   PO intake, Supplement acceptance, Labs, Weight trends, I & O's  REASON FOR ASSESSMENT:   Consult Assessment of nutrition requirement/status  ASSESSMENT:   70 year old female who presented on 6/16 as a direct admission from [facility] ED. PMH of critical blindness, T1DM on insulin pump, s/p lumbar fusion surgery on 12/26/20, readmission for post-infection/abscess with washout surgery on 01/11/21. Pt admitted with AKI on CKD 3b and UTI.  6/17-6/20 - NPO 6/20 - regular diet, thin liquids  Per Nephrology note, pt is not a dialysis candidate. Per Attending MD note, plan is to get abdominal x-ray today to rule out ileus as pt has been complaining of vomiting after eating.  Spoke with pt and husband at bedside. Pt with emesis bag in bed and endorses nausea. Pt requesting saltine crackers and states that she hasn't been able to get any yet. RD obtained a few packs of saltine crackers and pt ate one pack at time of RD visit.  Pt reports vomiting after eating almost every time that she eats. She states she either eats and vomits or gets too nauseous to eat. Pt has been tolerating most liquids like grape juice. Pt amenable to trying oral nutrition supplement like Ensure so RD will order. RD provided pt with an Ensure Enlive during visit.  Pt reports that her poor appetite and issues with N/V have been going on for "months" since her back surgery  in May 2022. Pt's husband seems to think issues with PO intake have started more recently.  Pt reports a UBW of 110-113 lbs. She believes that she has lost some muscle mass but reports it is hard to tell due to edema from fluid overload. The only weight available in chart PTA is from 09/06/19: 48.5 kg (106.7 lbs). Weight on admission was 54 kg and is now up to 69 kg. Pt with +2 pitting generalized edema. Difficult to determine dry weight at this time.  RD visit was kept short due to PT being about to work with pt. RD will attempt to obtain a more detailed diet history at follow-up.  Admit weight: 54 kg Current weight: 69.2 kg  Meal Completion: 25-75%  Medications reviewed and include: IV lasix 80 mg BID, SSI q 4 hours, levemir 12 units daily, levothyroxine, IV abx  Labs reviewed: sodium 133, BUN 50, creatinine 4.03, hemoglobin 8.7 CBG's: 73-210 x 24 hours  UOP: 1800 ml x 24 hours I/O's: +3.4 L since admit  NUTRITION - FOCUSED PHYSICAL EXAM:  Flowsheet Row Most Recent Value  Orbital Region Mild depletion  Upper Arm Region No depletion  Thoracic and Lumbar Region No depletion  Buccal Region No depletion  Temple Region Mild depletion  Clavicle Bone Region Mild depletion  Clavicle and Acromion Bone Region Mild depletion  Scapular Bone Region Unable to assess  Dorsal Hand Unable to assess  [edema]  Patellar Region Unable to assess  [edema]  Anterior Thigh Region Unable  to assess  [edema]  Posterior Calf Region Unable to assess  [edema]  Edema (RD Assessment) Moderate  [generalized]  Hair Reviewed  Eyes Reviewed  Mouth Reviewed  Skin Reviewed  Nails Reviewed       Diet Order:   Diet Order             Diet regular Room service appropriate? Yes with Assist; Fluid consistency: Thin  Diet effective now                   EDUCATION NEEDS:   Education needs have been addressed  Skin:  Skin Assessment: Reviewed RN Assessment  Last BM:  01/30/21 medium type 6  Height:    Ht Readings from Last 1 Encounters:  01/26/21 4\' 11"  (1.499 m)    Weight:   Wt Readings from Last 1 Encounters:  01/30/21 69.2 kg    BMI:  Body mass index is 30.81 kg/m.  Estimated Nutritional Needs:   Kcal:  1550-1750  Protein:  70-80 grams  Fluid:  1.5-1.7 L    02/01/21, MS, RD, LDN Inpatient Clinical Dietitian Please see AMiON for contact information.

## 2021-01-31 NOTE — Plan of Care (Signed)

## 2021-01-31 NOTE — Progress Notes (Signed)
[facility] ROUNDING NOTE   Subjective:   Interval History: This is a 70 year old lady with history of diabetes mellitus type 1.  Status post lumbar fusion L5-S1 12/26/2020.  Readmitted for infection abscess and washout 01/11/2021.  Discharged 01/16/2021.  Presented to [facility] 01/23/2021 with an elevated creatinine.  Baseline creatinine about 1.2 to 1.4 mg/dL.  Suspected sepsis UTI.  No hematuria proteinuria.  Suspected volume depletion getting IV fluids.  Creatinine slightly improved.    Eating  morning husband at bedside updated patient's husband with progress.  Still swollen  Urine output 1.2 L 01/31/2021  Blood pressure 168/43 pulse 94 temperature 98.4 O2 sats 96% room air  Labs pending a.m. 01/31/2021  Objective:  Vital signs in last 24 hours:  Temp:  [97.7 F (36.5 C)-98.4 F (36.9 C)] 98.4 F (36.9 C) (06/23 0457) Pulse Rate:  [80-96] 94 (06/23 0457) Resp:  [17-18] 18 (06/23 0457) BP: (149-168)/(43-60) 168/43 (06/23 0457) SpO2:  [95 %-98 %] 96 % (06/23 0457) Weight:  [69.2 kg] 69.2 kg (06/22 2152)  Weight change: -1.5 kg Filed Weights   01/30/21 0223 01/30/21 0619 01/30/21 2152  Weight: 70.7 kg 64.5 kg 69.2 kg    Intake/Output: I/O last 3 completed shifts: In: 2154.3 [P.O.:960; I.V.:1194.3] Out: 1950 [Urine:1950]   Intake/Output this shift:  No intake/output data recorded.  Gen no distress, a little more receptive to noise, pt is blind No rash, cyanosis or gangrene Sclera anicteric, throat clear No jvd or bruits Chest clear bilat to bases, no rales/ wheezing RRR no MRG Abd soft ntnd no mass or ascites +bs GU foley cath draining light yellow clear urine MS no joint effusions or deformity Ext no LE or UE edema, no wounds or ulcers Neuro is generally quite weak, not following commands   Basic Metabolic Panel: Recent Labs  Lab 01/27/21 0316 01/27/21 1726 01/28/21 0313 01/29/21 0407 01/30/21 0400 01/31/21 0507  NA 131* 130* 131* 133*  131*  --   K 3.4* 3.1* 4.0 3.4* 3.9  --   CL 94* 98 98 103 103  --   CO2 24 23 22  18* 19*  --   GLUCOSE 187* 167* 116* 78 109*  --   BUN 50* 45* 44* 45* 49*  --   CREATININE 4.80* 4.44* 4.44* 4.16* 4.04*  --   CALCIUM 7.1* 6.4* 7.4* 7.4* 7.9*  --   MG 1.9  --  1.7 2.0 1.9 1.9  PHOS 2.6  --  3.2 3.1 3.3 3.8     Liver Function Tests: Recent Labs  Lab 01/26/21 0318 01/27/21 0316 01/28/21 0313 01/29/21 0407 01/30/21 0400  AST 12* 16 15 14* 12*  ALT 8 7 8 9 8   ALKPHOS 76 74 76 67 64  BILITOT 0.4 0.1* 0.4 0.5 0.4  PROT 5.2* 5.1* 5.1* 4.9* 4.6*  ALBUMIN 1.7* 1.6* 1.5* 1.4* 1.4*    No results for input(s): LIPASE, AMYLASE in the last 168 hours. Recent Labs  Lab 01/25/21 1658  AMMONIA 34     CBC: Recent Labs  Lab 01/27/21 0316 01/28/21 0313 01/29/21 0407 01/30/21 0400 01/31/21 0507  WBC 14.1* 13.4* 12.1* 11.7* 13.6*  NEUTROABS 10.9* 10.2* 8.4* 8.4* 10.0*  HGB 9.5* 9.5* 9.3* 8.4* 8.7*  HCT 29.5* 29.4* 29.5* 27.3* 27.6*  MCV 97.7 98.0 100.3* 102.6* 100.0  PLT 311 275 284 244 233     Cardiac Enzymes: No results for input(s): CKTOTAL, CKMB, CKMBINDEX, TROPONINI in the last 168 hours.  BNP: Invalid input(s): POCBNP  CBG: Recent Labs  Lab 01/30/21 1601 01/30/21 1943 01/31/21 0018 01/31/21 0454 01/31/21 0807  GLUCAP 210* 190* 117* 73 85     Microbiology: Results for orders placed or performed during the hospital encounter of 01/24/21  Culture, Urine     Status: None   Collection Time: 01/25/21  4:02 PM   Specimen: Urine, Random  Result Value Ref Range Status   Specimen Description URINE, RANDOM  Final   Special Requests NONE  Final   Culture   Final    NO GROWTH Performed at [facility], [address] [place], [place] [address]    Report Status 01/27/2021 FINAL  Final    Coagulation Studies: No results for input(s): LABPROT, INR in the last 72 hours.  Urinalysis: No results for input(s): COLORURINE, LABSPEC, PHURINE, GLUCOSEU, HGBUR,  BILIRUBINUR, KETONESUR, PROTEINUR, UROBILINOGEN, NITRITE, LEUKOCYTESUR in the last 72 hours.  Invalid input(s): APPERANCEUR     Imaging: No results found.   Medications:    ceFEPime (MAXIPIME) IV 1 g (01/30/21 1029)    Chlorhexidine Gluconate Cloth  6 each Topical Daily   furosemide  80 mg Intravenous Q12H   heparin  5,000 Units Subcutaneous Q8H   hydrALAZINE  5 mg Intravenous Q6H   insulin aspart  0-15 Units Subcutaneous Q4H   insulin detemir  12 Units Subcutaneous QHS   levothyroxine  75 mcg Intravenous Daily   metoprolol tartrate  5 mg Intravenous TID   acetaminophen, HYDROmorphone (DILAUDID) injection, ondansetron (ZOFRAN) IV, oxyCODONE, senna-docusate, sodium chloride flush  Assessment/ Plan:  Acute kidney injury Baseline serum creatinine 1.2 to 1.4 mg deciliter increasing creatinine on admission.  Making urine.  An active urine sediment.  Foley catheter in place.  Creatinine appears a little improved this morning hopefully will plateaued. Appears to be markedly volume overloaded.  Appears to be diuresing fairly well with 80 mg every 12 hours of Lasix will maintain at this point.  She is not a dialysis candidate goals of care with be appropriate involving the assistance of hospice Recent lumbar fusion treated with Zyvox and cefepime for 4 weeks.  Complicated by postop infection.  Positive for Pseudomonas antibiotics now stopped. Diabetes mellitus type 1 Long history. Hypertension blood pressure controlled Hypothyroidism on replacement therapy Hypokalemia will replete   LOS: 7 [name] @TODAY @9 :30 AM

## 2021-01-31 NOTE — Progress Notes (Signed)
PROGRESS NOTE  Ariana Wilkinson ZOX:[id number] DOB: 1951-01-01 DOA: 01/24/2021 PCP: Barbette Reichmann, MD  HPI/Recap of past 31 hours: 70 year old female with past medical history of diabetes mellitus with insulin pump with peripheral neuropathy and hypertension who underwent a lumbar fusion surgery on 5/18 readmitted on 6/3 for postinfection abscess with infection site growing out Pseudomonas and discharged on 6/8, presented to the emergency room on 6/15 at [facility] with creatinine of 4.2 and CT of abdomen noting post renal obstruction from distended bladder.  Given lack of nephrology services at [facility], patient transferred to [facility] to [facility] on 6/16.  Nephrology consulted.  Patient's creatinine peaked at 5.05 on 6/18 and since then has been slowly trending downward.  Patient's acute kidney injury felt to be multifactorial from urinary retention from obstruction as well as poor p.o. intake plus large urinary tract infection.  Has been responding to bicarb and fluids plus antibiotics with creatinine slowly improving.  Lasix initiated on 6/22.  Creatinine at 4.03 today, little change from yesterday at 4.04.  Patient continues to complain of throwing up whenever she eats.  Denies any actual abdominal pain.  Assessment/Plan: Principal Problem:   AKI (acute kidney injury) [facility]): Slowly improving.  Appreciate nephrology help.  Continue fluids and antibiotics.  Lasix initiated.  Has diuresed over 4 L. Active Problems:   Acute urinary retention   Metabolic acidosis, normal anion gap (NAG)   Acute metabolic encephalopathy: Likely uremia.  Seems to have much improved as her renal function improves also.  Hopefully renal function will continue to improve  Hypothyroidism: On Synthroid    Overweight (BMI 25.0-29.9): Patient meets criteria BMI greater than 25.    Diabetes mellitus with peripheral autonomic neuropathy (HCC): On pump.  Noting that patient's A1c is rather low at  4.8.    Essential hypertension: Blood pressures ranging from 130s to 150s.    Unspecified protein-calorie malnutrition [facility]): Have asked nutrition for consult. Given complaints of vomiting, will go ahead and check abdominal x-ray.  Rule out ileus.  Urinary tract infection: Urine cultures with no growth although patient had been on IV antibiotics from previous hospitalization.  Complete cefepime today.  Leukocytosis: Noted upward trend.  Check procalcitonin level to ensure no additional infection. Code Status: Full code  Family Communication: Husband at the bedside  Disposition Plan: Likely would benefit from skilled nursing, but husband is more insistent on home with home health upon discharge.  Plan to discharge once renal function has stabilized and patient is fully diuresed.   Consultants: Nephrology  Procedures: None  Antimicrobials: IV cefepime 6/16-6/23  DVT prophylaxis: SCDs  Level of care: Telemetry Medical   Objective: Vitals:   01/30/21 2152 01/31/21 0457  BP: (!) 165/50 (!) 168/43  Pulse: 80 94  Resp: 18 18  Temp: 98.4 F (36.9 C) 98.4 F (36.9 C)  SpO2: 95% 96%    Intake/Output Summary (Last 24 hours) at 01/31/2021 0917 Last data filed at 01/31/2021 0539 Gross per 24 hour  Intake 360 ml  Output 1800 ml  Net -1440 ml    Filed Weights   01/30/21 0223 01/30/21 0619 01/30/21 2152  Weight: 70.7 kg 64.5 kg 69.2 kg   Body mass index is 30.81 kg/m.  Exam:  General: Alert and oriented x2, no acute distress, fatigued HEENT: Normocephalic and atraumatic, mucous membranes are slightly dry Cardiovascular: Regular rate and rhythm, S1-S2 no rubs Respiratory: Clear to auscultation bilaterally Abdomen: Soft, obese, nontender, some high-pitched bowel sounds Musculoskeletal: No clubbing  or cyanosis, 2+ pitting edema with some weeping Skin: No skin breaks, tears or lesions Psychiatry: Appropriate, no evidence of psychoses Neurology: Decreased chronic  peripheral neuropathy, otherwise no focal deficits   Data Reviewed: CBC: Recent Labs  Lab 01/27/21 0316 01/28/21 0313 01/29/21 0407 01/30/21 0400 01/31/21 0507  WBC 14.1* 13.4* 12.1* 11.7* 13.6*  NEUTROABS 10.9* 10.2* 8.4* 8.4* 10.0*  HGB 9.5* 9.5* 9.3* 8.4* 8.7*  HCT 29.5* 29.4* 29.5* 27.3* 27.6*  MCV 97.7 98.0 100.3* 102.6* 100.0  PLT 311 275 284 244 233    Basic Metabolic Panel: Recent Labs  Lab 01/27/21 0316 01/27/21 1726 01/28/21 0313 01/29/21 0407 01/30/21 0400 01/31/21 0507  NA 131* 130* 131* 133* 131*  --   K 3.4* 3.1* 4.0 3.4* 3.9  --   CL 94* 98 98 103 103  --   CO2 24 23 22  18* 19*  --   GLUCOSE 187* 167* 116* 78 109*  --   BUN 50* 45* 44* 45* 49*  --   CREATININE 4.80* 4.44* 4.44* 4.16* 4.04*  --   CALCIUM 7.1* 6.4* 7.4* 7.4* 7.9*  --   MG 1.9  --  1.7 2.0 1.9 1.9  PHOS 2.6  --  3.2 3.1 3.3 3.8    GFR: Estimated Creatinine Clearance: 11.1 mL/min (A) (by C-G formula based on SCr of 4.04 mg/dL (H)). Liver Function Tests: Recent Labs  Lab 01/26/21 0318 01/27/21 0316 01/28/21 0313 01/29/21 0407 01/30/21 0400  AST 12* 16 15 14* 12*  ALT 8 7 8 9 8   ALKPHOS 76 74 76 67 64  BILITOT 0.4 0.1* 0.4 0.5 0.4  PROT 5.2* 5.1* 5.1* 4.9* 4.6*  ALBUMIN 1.7* 1.6* 1.5* 1.4* 1.4*    No results for input(s): LIPASE, AMYLASE in the last 168 hours. Recent Labs  Lab 01/25/21 1658  AMMONIA 34    Coagulation Profile: No results for input(s): INR, PROTIME in the last 168 hours. Cardiac Enzymes: No results for input(s): CKTOTAL, CKMB, CKMBINDEX, TROPONINI in the last 168 hours. BNP (last 3 results) No results for input(s): PROBNP in the last 8760 hours. HbA1C: No results for input(s): HGBA1C in the last 72 hours. CBG: Recent Labs  Lab 01/30/21 1601 01/30/21 1943 01/31/21 0018 01/31/21 0454 01/31/21 0807  GLUCAP 210* 190* 117* 73 85    Lipid Profile: No results for input(s): CHOL, HDL, LDLCALC, TRIG, CHOLHDL, LDLDIRECT in the last 72 hours. Thyroid  Function Tests: No results for input(s): TSH, T4TOTAL, FREET4, T3FREE, THYROIDAB in the last 72 hours. Anemia Panel: Recent Labs    01/30/21 0400  VITAMINB12 2,768*    Urine analysis:    Component Value Date/Time   COLORURINE YELLOW 01/25/2021 1602   APPEARANCEUR CLOUDY (A) 01/25/2021 1602   LABSPEC 1.018 01/25/2021 1602   PHURINE 5.0 01/25/2021 1602   GLUCOSEU NEGATIVE 01/25/2021 1602   HGBUR SMALL (A) 01/25/2021 1602   BILIRUBINUR NEGATIVE 01/25/2021 1602   KETONESUR 5 (A) 01/25/2021 1602   PROTEINUR 100 (A) 01/25/2021 1602   NITRITE NEGATIVE 01/25/2021 1602   LEUKOCYTESUR LARGE (A) 01/25/2021 1602   Sepsis Labs: @LABRCNTIP (procalcitonin:4,lacticidven:4)  ) Recent Results (from the past 240 hour(s))  Culture, Urine     Status: None   Collection Time: 01/25/21  4:02 PM   Specimen: Urine, Random  Result Value Ref Range Status   Specimen Description URINE, RANDOM  Final   Special Requests NONE  Final   Culture   Final    NO GROWTH Performed at [facility],  [address] [place], [place] [address]    Report Status 01/27/2021 FINAL  Final      Studies: No results found.  Scheduled Meds:  Chlorhexidine Gluconate Cloth  6 each Topical Daily   furosemide  80 mg Intravenous Q12H   heparin  5,000 Units Subcutaneous Q8H   hydrALAZINE  5 mg Intravenous Q6H   insulin aspart  0-15 Units Subcutaneous Q4H   insulin detemir  12 Units Subcutaneous QHS   levothyroxine  75 mcg Intravenous Daily   metoprolol tartrate  5 mg Intravenous TID    Continuous Infusions:  ceFEPime (MAXIPIME) IV 1 g (01/30/21 1029)     LOS: 7 days     Hollice Espy, MD [facility]   01/31/2021, 9:17 AM

## 2021-01-31 NOTE — Progress Notes (Signed)
Inpatient Diabetes Program Recommendations  AACE/ADA: New Consensus Statement on Inpatient Glycemic Control   Target Ranges:  Prepandial:   less than 140 mg/dL      Peak postprandial:   less than 180 mg/dL (1-2 hours)      Critically ill patients:  140 - 180 mg/dL   Results for AUNICA, DAUPHINEE (MRN [id number]) as of 01/31/2021 09:41  Ref. Range 01/30/2021 08:07 01/30/2021 12:01 01/30/2021 16:01 01/30/2021 19:43 01/31/2021 00:18 01/31/2021 04:54 01/31/2021 08:07  Glucose-Capillary Latest Ref Range: 70 - 99 mg/dL 937 (H) 169 (H) 678 (H) 190 (H) 117 (H) 73 85    Review of Glycemic Control  Current orders for Inpatient glycemic control: Levemir 12 units QHS, Novolog 0-15 units Q4H  Inpatient Diabetes Program Recommendations:    Insulin: Glucose 73 mg/dl today. May want to consider decreasing Levemir to 10 units QHS.  Thanks, Orlando Penner, RN, MSN, CDE Diabetes Coordinator Inpatient Diabetes Program [phone] (Team Pager from 8am to 5pm)

## 2021-02-01 ENCOUNTER — Inpatient Hospital Stay (HOSPITAL_COMMUNITY): Payer: Medicare Other

## 2021-02-01 DIAGNOSIS — N1832 Chronic kidney disease, stage 3b: Secondary | ICD-10-CM

## 2021-02-01 LAB — CBC WITH DIFFERENTIAL/PLATELET
Abs Immature Granulocytes: 0.06 10*3/uL (ref 0.00–0.07)
Basophils Absolute: 0.2 10*3/uL — ABNORMAL HIGH (ref 0.0–0.1)
Basophils Relative: 1 %
Eosinophils Absolute: 0.4 10*3/uL (ref 0.0–0.5)
Eosinophils Relative: 3 %
HCT: 25.3 % — ABNORMAL LOW (ref 36.0–46.0)
Hemoglobin: 8.1 g/dL — ABNORMAL LOW (ref 12.0–15.0)
Immature Granulocytes: 1 %
Lymphocytes Relative: 15 %
Lymphs Abs: 1.9 10*3/uL (ref 0.7–4.0)
MCH: 31.8 pg (ref 26.0–34.0)
MCHC: 32 g/dL (ref 30.0–36.0)
MCV: 99.2 fL (ref 80.0–100.0)
Monocytes Absolute: 1.3 10*3/uL — ABNORMAL HIGH (ref 0.1–1.0)
Monocytes Relative: 10 %
Neutro Abs: 9.2 10*3/uL — ABNORMAL HIGH (ref 1.7–7.7)
Neutrophils Relative %: 70 %
Platelets: 198 10*3/uL (ref 150–400)
RBC: 2.55 MIL/uL — ABNORMAL LOW (ref 3.87–5.11)
RDW: 14.4 % (ref 11.5–15.5)
WBC: 13 10*3/uL — ABNORMAL HIGH (ref 4.0–10.5)
nRBC: 0 % (ref 0.0–0.2)

## 2021-02-01 LAB — RENAL FUNCTION PANEL
Albumin: 1.6 g/dL — ABNORMAL LOW (ref 3.5–5.0)
Anion gap: 10 (ref 5–15)
BUN: 53 mg/dL — ABNORMAL HIGH (ref 8–23)
CO2: 21 mmol/L — ABNORMAL LOW (ref 22–32)
Calcium: 8.7 mg/dL — ABNORMAL LOW (ref 8.9–10.3)
Chloride: 101 mmol/L (ref 98–111)
Creatinine, Ser: 4.07 mg/dL — ABNORMAL HIGH (ref 0.44–1.00)
GFR, Estimated: 11 mL/min — ABNORMAL LOW (ref >60)
Glucose, Bld: 143 mg/dL — ABNORMAL HIGH (ref 70–99)
Phosphorus: 4.1 mg/dL (ref 2.5–4.6)
Potassium: 3.4 mmol/L — ABNORMAL LOW (ref 3.5–5.1)
Sodium: 132 mmol/L — ABNORMAL LOW (ref 135–145)

## 2021-02-01 LAB — GLUCOSE, CAPILLARY
Glucose-Capillary: 198 mg/dL — ABNORMAL HIGH (ref 70–99)
Glucose-Capillary: 139 mg/dL — ABNORMAL HIGH (ref 70–99)
Glucose-Capillary: 90 mg/dL (ref 70–99)
Glucose-Capillary: 136 mg/dL — ABNORMAL HIGH (ref 70–99)
Glucose-Capillary: 171 mg/dL — ABNORMAL HIGH (ref 70–99)
Glucose-Capillary: 116 mg/dL — ABNORMAL HIGH (ref 70–99)

## 2021-02-01 LAB — PHOSPHORUS: Phosphorus: 3.9 mg/dL (ref 2.5–4.6)

## 2021-02-01 LAB — MAGNESIUM: Magnesium: 1.8 mg/dL (ref 1.7–2.4)

## 2021-02-01 MED ORDER — INSULIN DETEMIR 100 UNIT/ML ~~LOC~~ SOLN
10.0000 [IU] | Freq: Every day | SUBCUTANEOUS | Status: DC
  Administered 2021-02-01: 10 [IU] via SUBCUTANEOUS
  Filled 2021-02-01 (×2): qty 0.1

## 2021-02-01 MED ORDER — POTASSIUM CHLORIDE 10 MEQ/100ML IV SOLN
10.0000 meq | INTRAVENOUS | Status: AC
  Administered 2021-02-01 (×3): 10 meq via INTRAVENOUS
  Filled 2021-02-01 (×3): qty 100

## 2021-02-01 NOTE — Progress Notes (Signed)
[facility] night shift.  The patient had an episode of transient hypotension earlier.  After rechecking and repositioning her blood pressure is normal, but soft.  I have asked the staff to hold furosemide tonight.  Administration of metoprolol and hydralazine depending on blood pressure/heart rate parameters.  Sanda Klein, MD.

## 2021-02-01 NOTE — Progress Notes (Signed)
[facility] ROUNDING NOTE   Subjective:   Interval History: This is a 70 year old lady with history of diabetes mellitus type 1.  Status post lumbar fusion L5-S1 12/26/2020.  Readmitted for infection abscess and washout 01/11/2021.  Discharged 01/16/2021.  Presented to [facility] 01/23/2021 with an elevated creatinine.  Baseline creatinine about 1.2 to 1.4 mg/dL.  Suspected sepsis UTI.  No hematuria proteinuria.  Suspected volume depletion getting IV fluids.  Creatinine slightly improved.    Eating sitting out of bed to chair  Urine output 1.3 L 01/31/2021  Blood pressure 159/53 pulse 97 O2 sats 93% temperature 98.1  Sodium 130 potassium 3.4 chloride 101 CO2 21 BUN 53 creatinine 4 glucose 143 calcium 8.7 phosphorus 3.9 magnesium 1.8 albumin 1.6 hemoglobin 8.1  Objective:  Vital signs in last 24 hours:  Temp:  [97.5 F (36.4 C)-98.6 F (37 C)] 98.1 F (36.7 C) (06/24 1000) Pulse Rate:  [90-97] 97 (06/24 1000) Resp:  [16-18] 16 (06/24 1000) BP: (129-159)/(40-53) 159/53 (06/24 1000) SpO2:  [93 %-100 %] 93 % (06/24 1000) Weight:  [63.7 kg] 63.7 kg (06/24 0131)  Weight change: -5.5 kg Filed Weights   01/30/21 0619 01/30/21 2152 02/01/21 0131  Weight: 64.5 kg 69.2 kg 63.7 kg    Intake/Output: I/O last 3 completed shifts: In: 120 [P.O.:120] Out: 3025 [Urine:3025]   Intake/Output this shift:  No intake/output data recorded.  Gen no distress, a little more receptive to noise, pt is blind No rash, cyanosis or gangrene Sclera anicteric, throat clear No jvd or bruits Chest clear bilat to bases, no rales/ wheezing RRR no MRG Abd soft ntnd no mass or ascites +bs GU foley cath draining light yellow clear urine MS no joint effusions or deformity Ext no LE or UE edema, no wounds or ulcers Neuro generally weak   Basic Metabolic Panel: Recent Labs  Lab 01/28/21 0313 01/29/21 0407 01/30/21 0400 01/31/21 0507 01/31/21 0946 02/01/21 0414  NA 131* 133* 131*  --   133* 132*  K 4.0 3.4* 3.9  --  3.7 3.4*  CL 98 103 103  --  105 101  CO2 22 18* 19*  --  20* 21*  GLUCOSE 116* 78 109*  --  136* 143*  BUN 44* 45* 49*  --  50* 53*  CREATININE 4.44* 4.16* 4.04*  --  4.03* 4.07*  CALCIUM 7.4* 7.4* 7.9*  --  8.3* 8.7*  MG 1.7 2.0 1.9 1.9  --  1.8  PHOS 3.2 3.1 3.3 3.8  --  3.9  4.1     Liver Function Tests: Recent Labs  Lab 01/26/21 0318 01/27/21 0316 01/28/21 0313 01/29/21 0407 01/30/21 0400 02/01/21 0414  AST 12* 16 15 14* 12*  --   ALT 8 7 8 9 8   --   ALKPHOS 76 74 76 67 64  --   BILITOT 0.4 0.1* 0.4 0.5 0.4  --   PROT 5.2* 5.1* 5.1* 4.9* 4.6*  --   ALBUMIN 1.7* 1.6* 1.5* 1.4* 1.4* 1.6*    No results for input(s): LIPASE, AMYLASE in the last 168 hours. Recent Labs  Lab 01/25/21 1658  AMMONIA 34     CBC: Recent Labs  Lab 01/28/21 0313 01/29/21 0407 01/30/21 0400 01/31/21 0507 02/01/21 0414  WBC 13.4* 12.1* 11.7* 13.6* 13.0*  NEUTROABS 10.2* 8.4* 8.4* 10.0* 9.2*  HGB 9.5* 9.3* 8.4* 8.7* 8.1*  HCT 29.4* 29.5* 27.3* 27.6* 25.3*  MCV 98.0 100.3* 102.6* 100.0 99.2  PLT 275 284 244 233 198  Cardiac Enzymes: No results for input(s): CKTOTAL, CKMB, CKMBINDEX, TROPONINI in the last 168 hours.  BNP: Invalid input(s): POCBNP  CBG: Recent Labs  Lab 01/31/21 2151 02/01/21 0111 02/01/21 0446 02/01/21 0744 02/01/21 1130  GLUCAP 256* 198* 139* 90 136*     Microbiology: Results for orders placed or performed during the hospital encounter of 01/24/21  Culture, Urine     Status: None   Collection Time: 01/25/21  4:02 PM   Specimen: Urine, Random  Result Value Ref Range Status   Specimen Description URINE, RANDOM  Final   Special Requests NONE  Final   Culture   Final    NO GROWTH Performed at [facility], [address] [place], [place] [address]    Report Status 01/27/2021 FINAL  Final    Coagulation Studies: No results for input(s): LABPROT, INR in the last 72 hours.  Urinalysis: Recent Labs     01/31/21 1622  COLORURINE STRAW*  LABSPEC 1.008  PHURINE 5.0  GLUCOSEU NEGATIVE  HGBUR MODERATE*  BILIRUBINUR NEGATIVE  KETONESUR NEGATIVE  PROTEINUR 30*  NITRITE NEGATIVE  LEUKOCYTESUR NEGATIVE       Imaging: DG Abd Portable 1V  Result Date: 01/31/2021 CLINICAL DATA:  Possible ileus EXAM: PORTABLE ABDOMEN - 1 VIEW COMPARISON:  01/24/2021 FINDINGS: Scattered large and small bowel gas is noted. No dilatation is identified to suggest obstructive change ileus. No free air is noted. Postsurgical changes and degenerative changes in the lumbar spine are noted. IMPRESSION: No acute abnormality noted. Electronically Signed   By: Alcide Clever M.D.   On: 01/31/2021 17:46     Medications:    ceFEPime (MAXIPIME) IV 1 g (02/01/21 1008)    Chlorhexidine Gluconate Cloth  6 each Topical Daily   furosemide  80 mg Intravenous Q12H   heparin  5,000 Units Subcutaneous Q8H   hydrALAZINE  5 mg Intravenous Q6H   insulin aspart  0-15 Units Subcutaneous Q4H   insulin detemir  10 Units Subcutaneous QHS   levothyroxine  75 mcg Intravenous Daily   metoprolol tartrate  5 mg Intravenous TID   acetaminophen, HYDROmorphone (DILAUDID) injection, ondansetron (ZOFRAN) IV, oxyCODONE, senna-docusate, sodium chloride flush  Assessment/ Plan:  Acute kidney injury Baseline serum creatinine 1.2 to 1.4 mg deciliter increasing creatinine on admission.  Making urine.  An active urine sediment.  Foley catheter in place.  Creatinine appears a little improved this morning hopefully will plateaued. Appears to be markedly volume overloaded.  Appears to be diuresing fairly well with 80 mg every 12 hours of Lasix will maintain at this point.  She is not a dialysis candidate goals of care with be appropriate involving the assistance of hospice Recent lumbar fusion treated with Zyvox and cefepime for 4 weeks.  Complicated by postop infection.  Positive for Pseudomonas antibiotics now stopped. Diabetes mellitus type 1 Long  history. Hypertension blood pressure controlled Hypothyroidism on replacement therapy Hypokalemia will replete   LOS: 8 Ariana Wilkinson @TODAY @12 :36 PM

## 2021-02-01 NOTE — Progress Notes (Signed)
PROGRESS NOTE  Ariana Wilkinson GNF:[id number] DOB: [birth year] DOA: 01/24/2021 PCP: Barbette Reichmann, MD  HPI/Recap of past 26 hours: 70 year old female with past medical history of diabetes mellitus with insulin pump with peripheral neuropathy and hypertension who underwent a lumbar fusion surgery on 5/18 readmitted on 6/3 for postinfection abscess with infection site growing out Pseudomonas.  Patient started on IV cefepime and discharged on 6/8, but then presented to the emergency room on 6/15 at [facility] with creatinine of 4.2 and CT of abdomen noting post renal obstruction from distended bladder.  Given lack of nephrology services at [facility], patient transferred to [facility] health to [facility] on 6/16.  Nephrology consulted.  Patient's creatinine peaked at 5.05 on 6/18 and since then has been slowly trending downward.  Patient's acute kidney injury felt to be multifactorial from urinary retention from obstruction as well as poor p.o. intake plus large urinary tract infection.  Has been responding to bicarb and fluids plus antibiotics with creatinine slowly improving.  Lasix initiated on 6/22.  To date has diuresed almost 5 L of fluid and creatinine has stayed around 4.06 for the last 2 days.  Patient continues to have little appetite, still quite nauseated.  Noted white count had been trending upward and procalcitonin level checked on 6/23 elevated at 3.98, although down from 6/20.  Urinalysis negative.  Chest x-ray pending.  Assessment/Plan: Principal Problem:   AKI (acute kidney injury) [facility]): Appreciate nephrology help.  Continue fluids and cefepime.  Creatinine has started to level off, hopefully will improve.  Continue diuresis. Active Problems:   Acute urinary retention   Metabolic acidosis, normal anion gap (NAG)    Acute metabolic encephalopathy: Likely uremia.  Seems to have much improved as her renal function improves also.  Hopefully renal function will continue to  improve  Hypothyroidism: On Synthroid    Overweight (BMI 25.0-29.9): Patient meets criteria BMI greater than 25.    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  Noting that patient's A1c is rather low at 4.8.  Have decreased daily Levemir    Essential hypertension: Blood pressures ranging from 130s to 150s.  Suspected protein-calorie malnutrition [facility]): Nutrition consulted unable to assess due to volume overload  Nausea/vomiting: Secondary to uremia.  Abdominal x-ray notes no evidence of impaction or ileus  Urinary tract infection: Urine cultures with no growth although patient had been on IV antibiotics from previous hospitalization.   Surgical site infection/leukocytosis: Noted upward trend.  Procalcitonin level overall trending downward.  Code Status: Full code  Family Communication: Husband at the bedside, updated son by phone  Disposition Plan: Likely would benefit from skilled nursing, but husband is more insistent on home with home health upon discharge.  Plan to discharge once renal function hopefully better stabilized and patient is fully diuresed.   Consultants: Nephrology  Procedures: None  Antimicrobials: IV cefepime 6/16-6/23  DVT prophylaxis: SCDs  Level of care: Telemetry Medical   Objective: Vitals:   02/01/21 0455 02/01/21 0500  BP: (!) 146/50   Pulse: 94   Resp:  18  Temp: (!) 97.5 F (36.4 C)   SpO2: 93%     Intake/Output Summary (Last 24 hours) at 02/01/2021 0916 Last data filed at 02/01/2021 0137 Gross per 24 hour  Intake --  Output 1225 ml  Net -1225 ml    Filed Weights   01/30/21 0619 01/30/21 2152 02/01/21 0131  Weight: 64.5 kg 69.2 kg 63.7 kg   Body mass index  is 28.36 kg/m.  Exam:  General: Alert and oriented x2, fatigued, nauseated HEENT: Normocephalic and atraumatic, mucous membranes are slightly dry Cardiovascular: Regular rate and rhythm, S1-S2 no rubs Respiratory: Clear to  auscultation bilaterally Abdomen: Soft, obese, nontender, few high-pitched bowel sounds Musculoskeletal: No clubbing or cyanosis, 2+ pitting edema with some weeping Skin: No skin breaks, tears or lesions Psychiatry: Appropriate, no evidence of psychoses Neurology: Decreased chronic peripheral neuropathy, otherwise no focal deficits   Data Reviewed: CBC: Recent Labs  Lab 01/28/21 0313 01/29/21 0407 01/30/21 0400 01/31/21 0507 02/01/21 0414  WBC 13.4* 12.1* 11.7* 13.6* 13.0*  NEUTROABS 10.2* 8.4* 8.4* 10.0* 9.2*  HGB 9.5* 9.3* 8.4* 8.7* 8.1*  HCT 29.4* 29.5* 27.3* 27.6* 25.3*  MCV 98.0 100.3* 102.6* 100.0 99.2  PLT 275 284 244 233 198    Basic Metabolic Panel: Recent Labs  Lab 01/28/21 0313 01/29/21 0407 01/30/21 0400 01/31/21 0507 01/31/21 0946 02/01/21 0414  NA 131* 133* 131*  --  133* 132*  K 4.0 3.4* 3.9  --  3.7 3.4*  CL 98 103 103  --  105 101  CO2 22 18* 19*  --  20* 21*  GLUCOSE 116* 78 109*  --  136* 143*  BUN 44* 45* 49*  --  50* 53*  CREATININE 4.44* 4.16* 4.04*  --  4.03* 4.07*  CALCIUM 7.4* 7.4* 7.9*  --  8.3* 8.7*  MG 1.7 2.0 1.9 1.9  --  1.8  PHOS 3.2 3.1 3.3 3.8  --  3.9  4.1    GFR: Estimated Creatinine Clearance: 10.6 mL/min (A) (by C-G formula based on SCr of 4.07 mg/dL (H)). Liver Function Tests: Recent Labs  Lab 01/26/21 0318 01/27/21 0316 01/28/21 0313 01/29/21 0407 01/30/21 0400 02/01/21 0414  AST 12* 16 15 14* 12*  --   ALT 8 7 8 9 8   --   ALKPHOS 76 74 76 67 64  --   BILITOT 0.4 0.1* 0.4 0.5 0.4  --   PROT 5.2* 5.1* 5.1* 4.9* 4.6*  --   ALBUMIN 1.7* 1.6* 1.5* 1.4* 1.4* 1.6*    No results for input(s): LIPASE, AMYLASE in the last 168 hours. Recent Labs  Lab 01/25/21 1658  AMMONIA 34    Coagulation Profile: No results for input(s): INR, PROTIME in the last 168 hours. Cardiac Enzymes: No results for input(s): CKTOTAL, CKMB, CKMBINDEX, TROPONINI in the last 168 hours. BNP (last 3 results) No results for input(s): PROBNP  in the last 8760 hours. HbA1C: No results for input(s): HGBA1C in the last 72 hours. CBG: Recent Labs  Lab 01/31/21 1635 01/31/21 2151 02/01/21 0111 02/01/21 0446 02/01/21 0744  GLUCAP 246* 256* 198* 139* 90    Lipid Profile: No results for input(s): CHOL, HDL, LDLCALC, TRIG, CHOLHDL, LDLDIRECT in the last 72 hours. Thyroid Function Tests: No results for input(s): TSH, T4TOTAL, FREET4, T3FREE, THYROIDAB in the last 72 hours. Anemia Panel: Recent Labs    01/30/21 0400  VITAMINB12 2,768*    Urine analysis:    Component Value Date/Time   COLORURINE STRAW (A) 01/31/2021 1622   APPEARANCEUR CLEAR 01/31/2021 1622   LABSPEC 1.008 01/31/2021 1622   PHURINE 5.0 01/31/2021 1622   GLUCOSEU NEGATIVE 01/31/2021 1622   HGBUR MODERATE (A) 01/31/2021 1622   BILIRUBINUR NEGATIVE 01/31/2021 1622   KETONESUR NEGATIVE 01/31/2021 1622   PROTEINUR 30 (A) 01/31/2021 1622   NITRITE NEGATIVE 01/31/2021 1622   LEUKOCYTESUR NEGATIVE 01/31/2021 1622   Sepsis Labs: @LABRCNTIP (procalcitonin:4,lacticidven:4)  ) Recent Results (from  the past 240 hour(s))  Culture, Urine     Status: None   Collection Time: 01/25/21  4:02 PM   Specimen: Urine, Random  Result Value Ref Range Status   Specimen Description URINE, RANDOM  Final   Special Requests NONE  Final   Culture   Final    NO GROWTH Performed at [facility], [address] [place], [place] [address]    Report Status 01/27/2021 FINAL  Final      Studies: No results found.  Scheduled Meds:  Chlorhexidine Gluconate Cloth  6 each Topical Daily   furosemide  80 mg Intravenous Q12H   heparin  5,000 Units Subcutaneous Q8H   hydrALAZINE  5 mg Intravenous Q6H   insulin aspart  0-15 Units Subcutaneous Q4H   insulin detemir  10 Units Subcutaneous QHS   levothyroxine  75 mcg Intravenous Daily   metoprolol tartrate  5 mg Intravenous TID    Continuous Infusions:  ceFEPime (MAXIPIME) IV       LOS: 8 days     Hollice Espy, MD [facility]   02/01/2021, 9:16 AM

## 2021-02-02 LAB — CBC WITH DIFFERENTIAL/PLATELET
Abs Immature Granulocytes: 0.1 10*3/uL — ABNORMAL HIGH (ref 0.00–0.07)
Basophils Absolute: 0.2 10*3/uL — ABNORMAL HIGH (ref 0.0–0.1)
Basophils Relative: 1 %
Eosinophils Absolute: 0.5 10*3/uL (ref 0.0–0.5)
Eosinophils Relative: 3 %
HCT: 26.1 % — ABNORMAL LOW (ref 36.0–46.0)
Hemoglobin: 8.3 g/dL — ABNORMAL LOW (ref 12.0–15.0)
Immature Granulocytes: 1 %
Lymphocytes Relative: 15 %
Lymphs Abs: 2.2 10*3/uL (ref 0.7–4.0)
MCH: 31.8 pg (ref 26.0–34.0)
MCHC: 31.8 g/dL (ref 30.0–36.0)
MCV: 100 fL (ref 80.0–100.0)
Monocytes Absolute: 1.6 10*3/uL — ABNORMAL HIGH (ref 0.1–1.0)
Monocytes Relative: 11 %
Neutro Abs: 9.7 10*3/uL — ABNORMAL HIGH (ref 1.7–7.7)
Neutrophils Relative %: 69 %
Platelets: 187 10*3/uL (ref 150–400)
RBC: 2.61 MIL/uL — ABNORMAL LOW (ref 3.87–5.11)
RDW: 14.3 % (ref 11.5–15.5)
WBC: 14.2 10*3/uL — ABNORMAL HIGH (ref 4.0–10.5)
nRBC: 0.3 % — ABNORMAL HIGH (ref 0.0–0.2)

## 2021-02-02 LAB — GLUCOSE, CAPILLARY
Glucose-Capillary: 129 mg/dL — ABNORMAL HIGH (ref 70–99)
Glucose-Capillary: 140 mg/dL — ABNORMAL HIGH (ref 70–99)
Glucose-Capillary: 143 mg/dL — ABNORMAL HIGH (ref 70–99)
Glucose-Capillary: 232 mg/dL — ABNORMAL HIGH (ref 70–99)
Glucose-Capillary: 334 mg/dL — ABNORMAL HIGH (ref 70–99)
Glucose-Capillary: 62 mg/dL — ABNORMAL LOW (ref 70–99)
Glucose-Capillary: 63 mg/dL — ABNORMAL LOW (ref 70–99)

## 2021-02-02 LAB — RENAL FUNCTION PANEL
Albumin: 1.6 g/dL — ABNORMAL LOW (ref 3.5–5.0)
Anion gap: 11 (ref 5–15)
BUN: 52 mg/dL — ABNORMAL HIGH (ref 8–23)
CO2: 20 mmol/L — ABNORMAL LOW (ref 22–32)
Calcium: 8.4 mg/dL — ABNORMAL LOW (ref 8.9–10.3)
Chloride: 98 mmol/L (ref 98–111)
Creatinine, Ser: 4.05 mg/dL — ABNORMAL HIGH (ref 0.44–1.00)
GFR, Estimated: 11 mL/min — ABNORMAL LOW (ref >60)
Glucose, Bld: 63 mg/dL — ABNORMAL LOW (ref 70–99)
Phosphorus: 3.6 mg/dL (ref 2.5–4.6)
Potassium: 3.6 mmol/L (ref 3.5–5.1)
Sodium: 129 mmol/L — ABNORMAL LOW (ref 135–145)

## 2021-02-02 LAB — PHOSPHORUS: Phosphorus: 3.5 mg/dL (ref 2.5–4.6)

## 2021-02-02 LAB — MAGNESIUM: Magnesium: 1.7 mg/dL (ref 1.7–2.4)

## 2021-02-02 LAB — PROCALCITONIN: Procalcitonin: 3.63 ng/mL

## 2021-02-02 MED ORDER — SULFASALAZINE 500 MG PO TABS
1000.0000 mg | ORAL_TABLET | Freq: Two times a day (BID) | ORAL | Status: DC
  Administered 2021-02-02 – 2021-02-04 (×6): 1000 mg via ORAL
  Filled 2021-02-02 (×7): qty 2

## 2021-02-02 MED ORDER — ONDANSETRON HCL 4 MG/2ML IJ SOLN
4.0000 mg | Freq: Four times a day (QID) | INTRAMUSCULAR | Status: DC
  Administered 2021-02-02 – 2021-02-08 (×24): 4 mg via INTRAVENOUS
  Filled 2021-02-02 (×24): qty 2

## 2021-02-02 MED ORDER — SODIUM CHLORIDE 0.9 % IV SOLN
6.2500 mg | Freq: Four times a day (QID) | INTRAVENOUS | Status: DC | PRN
  Administered 2021-02-02 – 2021-02-03 (×2): 6.25 mg via INTRAVENOUS
  Filled 2021-02-02 (×3): qty 0.25

## 2021-02-02 MED ORDER — AMLODIPINE BESYLATE 5 MG PO TABS
5.0000 mg | ORAL_TABLET | Freq: Every day | ORAL | Status: DC
  Administered 2021-02-02 – 2021-02-08 (×6): 5 mg via ORAL
  Filled 2021-02-02 (×7): qty 1

## 2021-02-02 MED ORDER — INSULIN DETEMIR 100 UNIT/ML ~~LOC~~ SOLN
20.0000 [IU] | Freq: Every day | SUBCUTANEOUS | Status: DC
  Administered 2021-02-02: 20 [IU] via SUBCUTANEOUS
  Filled 2021-02-02 (×2): qty 0.2

## 2021-02-02 MED ORDER — LEVOTHYROXINE SODIUM 100 MCG PO TABS
100.0000 ug | ORAL_TABLET | Freq: Every day | ORAL | Status: DC
  Administered 2021-02-03 – 2021-02-04 (×2): 100 ug via ORAL
  Filled 2021-02-02 (×2): qty 1

## 2021-02-02 MED ORDER — DEXTROSE 50 % IV SOLN
INTRAVENOUS | Status: AC
  Administered 2021-02-02: 25 mL
  Filled 2021-02-02: qty 50

## 2021-02-02 MED ORDER — CARVEDILOL 25 MG PO TABS
25.0000 mg | ORAL_TABLET | Freq: Two times a day (BID) | ORAL | Status: DC
  Administered 2021-02-02 – 2021-02-08 (×13): 25 mg via ORAL
  Filled 2021-02-02 (×13): qty 1

## 2021-02-02 NOTE — Progress Notes (Signed)
[facility] ROUNDING NOTE   Subjective:   Interval History: This is a 70 year old lady with history of diabetes mellitus type 1.  Status post lumbar fusion L5-S1 12/26/2020.  Readmitted for infection abscess and washout 01/11/2021.  Discharged 01/16/2021.  Presented to [facility] 01/23/2021 with an elevated creatinine.  Baseline creatinine about 1.2 to 1.4 mg/dL.  Suspected sepsis UTI.  No hematuria proteinuria.  Suspected volume depletion getting IV fluids.  Creatinine unchanged  Still feels weak.  Great urine output 2.7 L out 02/01/2021  Blood pressure 169/58 pulse 101 temperature 97.8 O2 sats 94% room  Sodium 129 potassium 3.6 chloride 98 CO2 20 BUN 52 creatinine 4.05 glucose 63 calcium 8.4 hemoglobin 8.3  Lasix 80 mg IV every 12 hours, insulin, levothyroxine, Lopressor IV.  IV cefepime  Objective:  Vital signs in last 24 hours:  Temp:  [97.8 F (36.6 C)-98.8 F (37.1 C)] 97.8 F (36.6 C) (06/25 1008) Pulse Rate:  [88-101] 101 (06/25 1008) Resp:  [16-18] 18 (06/25 1008) BP: (78-169)/(33-58) 169/58 (06/25 1008) SpO2:  [90 %-96 %] 94 % (06/25 1008)  Weight change:  Filed Weights   01/30/21 0619 01/30/21 2152 02/01/21 0131  Weight: 64.5 kg 69.2 kg 63.7 kg    Intake/Output: I/O last 3 completed shifts: In: 560 [P.O.:560] Out: 3401 [Urine:3400; Stool:1]   Intake/Output this shift:  No intake/output data recorded.  Gen no distress, a little more receptive to noise, pt is blind No rash, cyanosis or gangrene Sclera anicteric, throat clear No jvd or bruits Chest clear bilat to bases, no rales/ wheezing RRR no MRG Abd soft ntnd no mass or ascites +bs GU foley cath draining light yellow clear urine MS no joint effusions or deformity Ext no LE or UE edema, no wounds or ulcers Neuro generally weak   Basic Metabolic Panel: Recent Labs  Lab 01/29/21 0407 01/30/21 0400 01/31/21 0507 01/31/21 0946 02/01/21 0414 02/02/21 0407  NA 133* 131*  --  133*  132* 129*  K 3.4* 3.9  --  3.7 3.4* 3.6  CL 103 103  --  105 101 98  CO2 18* 19*  --  20* 21* 20*  GLUCOSE 78 109*  --  136* 143* 63*  BUN 45* 49*  --  50* 53* 52*  CREATININE 4.16* 4.04*  --  4.03* 4.07* 4.05*  CALCIUM 7.4* 7.9*  --  8.3* 8.7* 8.4*  MG 2.0 1.9 1.9  --  1.8 1.7  PHOS 3.1 3.3 3.8  --  3.9  4.1 3.5  3.6     Liver Function Tests: Recent Labs  Lab 01/27/21 0316 01/28/21 0313 01/29/21 0407 01/30/21 0400 02/01/21 0414 02/02/21 0407  AST 16 15 14* 12*  --   --   ALT 7 8 9 8   --   --   ALKPHOS 74 76 67 64  --   --   BILITOT 0.1* 0.4 0.5 0.4  --   --   PROT 5.1* 5.1* 4.9* 4.6*  --   --   ALBUMIN 1.6* 1.5* 1.4* 1.4* 1.6* 1.6*    No results for input(s): LIPASE, AMYLASE in the last 168 hours. No results for input(s): AMMONIA in the last 168 hours.   CBC: Recent Labs  Lab 01/29/21 0407 01/30/21 0400 01/31/21 0507 02/01/21 0414 02/02/21 0407  WBC 12.1* 11.7* 13.6* 13.0* 14.2*  NEUTROABS 8.4* 8.4* 10.0* 9.2* 9.7*  HGB 9.3* 8.4* 8.7* 8.1* 8.3*  HCT 29.5* 27.3* 27.6* 25.3* 26.1*  MCV 100.3* 102.6* 100.0 99.2  100.0  PLT 284 244 233 198 187     Cardiac Enzymes: No results for input(s): CKTOTAL, CKMB, CKMBINDEX, TROPONINI in the last 168 hours.  BNP: Invalid input(s): POCBNP  CBG: Recent Labs  Lab 02/01/21 2108 02/02/21 0040 02/02/21 0418 02/02/21 0452 02/02/21 0509  GLUCAP 116* 129* 62* 63* 143*     Microbiology: Results for orders placed or performed during the hospital encounter of 01/24/21  Culture, Urine     Status: None   Collection Time: 01/25/21  4:02 PM   Specimen: Urine, Random  Result Value Ref Range Status   Specimen Description URINE, RANDOM  Final   Special Requests NONE  Final   Culture   Final    NO GROWTH Performed at [facility], [address], [place], [place] [address]    Report Status 01/27/2021 FINAL  Final    Coagulation Studies: No results for input(s): LABPROT, INR in the last 72  hours.  Urinalysis: Recent Labs    01/31/21 1622  COLORURINE STRAW*  LABSPEC 1.008  PHURINE 5.0  GLUCOSEU NEGATIVE  HGBUR MODERATE*  BILIRUBINUR NEGATIVE  KETONESUR NEGATIVE  PROTEINUR 30*  NITRITE NEGATIVE  LEUKOCYTESUR NEGATIVE       Imaging: DG Chest 2 View  Result Date: 02/01/2021 CLINICAL DATA:  Pneumonia EXAM: CHEST - 2 VIEW COMPARISON:  01/25/2021 FINDINGS: The heart size and mediastinal contours are within normal limits. Left upper extremity PICC. New small bilateral pleural effusions. Similar mild, diffuse interstitial opacity. The visualized skeletal structures are unremarkable. IMPRESSION: New small bilateral pleural effusions. Similar mild, diffuse interstitial opacity, likely edema. Electronically Signed   By: Lauralyn Primes M.D.   On: 02/01/2021 15:17     Medications:    ceFEPime (MAXIPIME) IV 1 g (02/01/21 1008)    Chlorhexidine Gluconate Cloth  6 each Topical Daily   furosemide  80 mg Intravenous Q12H   heparin  5,000 Units Subcutaneous Q8H   hydrALAZINE  5 mg Intravenous Q6H   insulin aspart  0-15 Units Subcutaneous Q4H   insulin detemir  10 Units Subcutaneous QHS   levothyroxine  75 mcg Intravenous Daily   metoprolol tartrate  5 mg Intravenous TID   acetaminophen, HYDROmorphone (DILAUDID) injection, ondansetron (ZOFRAN) IV, oxyCODONE, senna-docusate, sodium chloride flush  Assessment/ Plan:  Acute kidney injury Baseline serum creatinine 1.2 to 1.4 mg deciliter increasing creatinine on admission.  Making urine.  An active urine sediment.  Foley catheter in place.  Creatinine appears a little improved this morning hopefully will plateaued. Appears to be markedly volume overloaded.  Appears to be diuresing fairly well with 80 mg every 12 hours of Lasix will maintain at this point.  She is not a dialysis candidate goals of care with be appropriate involving the assistance of hospice.  Hopefully blood pressure will start to improve once volume is  improved. Recent lumbar fusion treated with Zyvox and cefepime for 4 weeks.  Complicated by postop infection.  Positive for Pseudomonas antibiotics now stopped. Diabetes mellitus type 1 Long history. Hypertension blood pressure controlled Hypothyroidism on replacement therapy Hypokalemia improved.  Continue to follow.   LOS: 9 [name] @TODAY @10 :49 AM

## 2021-02-02 NOTE — Progress Notes (Signed)
Hypoglycemic Event  CBG: 62 mg/dL @ 36:68  Treatment: 2 cups grape juice, pt. Refused snacks  Symptoms: asymptomatic  Follow-up DPT:ELMR 04:52 CBGresult:63mg /dL  Treatment: 1/2 amp A15 57ml IV  Follow-up HID:UPBD 05:09  CBG result:143mg /dL  Possible Reasons for Event: Unknown     Milus Banister

## 2021-02-02 NOTE — Progress Notes (Addendum)
PROGRESS NOTE  Ariana Wilkinson VQM:[id number] DOB: [birth year] DOA: 01/24/2021 PCP: Barbette Reichmann, MD  HPI/Recap of past 19 hours: 70 year old female with past medical history of diabetes mellitus with insulin pump with peripheral neuropathy and hypertension who underwent a lumbar fusion surgery on 5/18 readmitted on 6/3 for postinfection abscess with infection site growing out Pseudomonas.  Patient started on IV cefepime and discharged on 6/8, but then presented to the emergency room on 6/15 at [facility] with creatinine of 4.2 and CT of abdomen noting post renal obstruction from distended bladder.  Given lack of nephrology services at [facility], patient transferred to [facility] health to [facility] on 6/16.  Nephrology consulted.  Patient's creatinine peaked at 5.05 on 6/18 and since then has been slowly trending downward.  Patient's acute kidney injury felt to be multifactorial from urinary retention from obstruction as well as poor p.o. intake plus large urinary tract infection.  Has been responding to bicarb and fluids plus antibiotics with creatinine slowly improving.  Lasix initiated on 6/22.  To date has diuresed almost 5 L of fluid and creatinine has stayed around 4.06 for the last 2 days.  Patient continues to have little appetite, still quite nauseated.  Noted white count had been trending upward and procalcitonin level checked on 6/23 elevated at 3.98, although down from 6/20.  Urinalysis negative.  Chest x-ray noting effusions.    Assessment/Plan: Principal Problem:   AKI (acute kidney injury) [facility]): Appreciate nephrology help.  Continue fluids and cefepime.  Creatinine has started to stabilize around 4.  This might be her new normal.  Continue diuresis. Active Problems:   Acute urinary retention   Metabolic acidosis, normal anion gap (NAG)    Acute metabolic encephalopathy: Likely uremia.  Seems to have much improved as her renal function improves also.  Hopefully renal  function will continue to improve  Hypothyroidism: On Synthroid, changed back to p.o.    Overweight (BMI 25.0-29.9): Patient meets criteria BMI greater than 25.    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  Noting that patient's A1c is rather low at 4.8.  Noted noon sugar today at 334.  Increase Levemir back    Essential hypertension: Blood pressures ranging from 130s to 150s.  Had some episodes of hypotension, likely in part due to getting all of her meds IV.  Will change back to p.o. which should help space this out.  Suspected protein-calorie malnutrition [facility]): Nutrition consulted unable to assess due to volume overload  Nausea/vomiting: Secondary to uremia.  Abdominal x-ray notes no evidence of impaction or ileus.  We will try adding some scheduled Zofran to as needed  Urinary tract infection: Urine cultures with no growth although patient had been on IV antibiotics from previous hospitalization.   Surgical site infection/leukocytosis: Noted upward trend.  Procalcitonin level overall trending downward.  Code Status: Full code  Family Communication: Husband at the bedside  Disposition Plan: Likely would benefit from skilled nursing, but husband is more insistent on home with home health upon discharge.  Plan to discharge once renal function hopefully better stabilized and patient is fully diuresed.   Consultants: Nephrology  Procedures: None  Antimicrobials: IV cefepime 6/16-6/23  DVT prophylaxis: SCDs  Level of care: Telemetry Medical   Objective: Vitals:   02/02/21 0504 02/02/21 1008  BP: (!) 162/45 (!) 169/58  Pulse: 88 (!) 101  Resp: 18 18  Temp: 97.8 F (36.6 C) 97.8 F (36.6 C)  SpO2: 90% 94%  Intake/Output Summary (Last 24 hours) at 02/02/2021 1300 Last data filed at 02/02/2021 0603 Gross per 24 hour  Intake 340 ml  Output 2776 ml  Net -2436 ml    Filed Weights   01/30/21 0619 01/30/21 2152 02/01/21  0131  Weight: 64.5 kg 69.2 kg 63.7 kg   Body mass index is 28.36 kg/m.  Exam:  General: Alert and oriented x2, fatigued, nauseated HEENT: Normocephalic and atraumatic, mucous membranes are slightly dry Cardiovascular: Regular rate and rhythm, S1-S2 no rubs Respiratory: Clear to auscultation bilaterally Abdomen: Soft, obese, nontender, few high-pitched bowel sounds Musculoskeletal: No clubbing or cyanosis, 2+ pitting edema with some weeping Skin: No skin breaks, tears or lesions Psychiatry: Appropriate, no evidence of psychoses Neurology: Decreased chronic peripheral neuropathy, otherwise no focal deficits   Data Reviewed: CBC: Recent Labs  Lab 01/29/21 0407 01/30/21 0400 01/31/21 0507 02/01/21 0414 02/02/21 0407  WBC 12.1* 11.7* 13.6* 13.0* 14.2*  NEUTROABS 8.4* 8.4* 10.0* 9.2* 9.7*  HGB 9.3* 8.4* 8.7* 8.1* 8.3*  HCT 29.5* 27.3* 27.6* 25.3* 26.1*  MCV 100.3* 102.6* 100.0 99.2 100.0  PLT 284 244 233 198 187    Basic Metabolic Panel: Recent Labs  Lab 01/29/21 0407 01/30/21 0400 01/31/21 0507 01/31/21 0946 02/01/21 0414 02/02/21 0407  NA 133* 131*  --  133* 132* 129*  K 3.4* 3.9  --  3.7 3.4* 3.6  CL 103 103  --  105 101 98  CO2 18* 19*  --  20* 21* 20*  GLUCOSE 78 109*  --  136* 143* 63*  BUN 45* 49*  --  50* 53* 52*  CREATININE 4.16* 4.04*  --  4.03* 4.07* 4.05*  CALCIUM 7.4* 7.9*  --  8.3* 8.7* 8.4*  MG 2.0 1.9 1.9  --  1.8 1.7  PHOS 3.1 3.3 3.8  --  3.9  4.1 3.5  3.6    GFR: Estimated Creatinine Clearance: 10.6 mL/min (A) (by C-G formula based on SCr of 4.05 mg/dL (H)). Liver Function Tests: Recent Labs  Lab 01/27/21 0316 01/28/21 0313 01/29/21 0407 01/30/21 0400 02/01/21 0414 02/02/21 0407  AST 16 15 14* 12*  --   --   ALT 7 8 9 8   --   --   ALKPHOS 74 76 67 64  --   --   BILITOT 0.1* 0.4 0.5 0.4  --   --   PROT 5.1* 5.1* 4.9* 4.6*  --   --   ALBUMIN 1.6* 1.5* 1.4* 1.4* 1.6* 1.6*    No results for input(s): LIPASE, AMYLASE in the last  168 hours. No results for input(s): AMMONIA in the last 168 hours.  Coagulation Profile: No results for input(s): INR, PROTIME in the last 168 hours. Cardiac Enzymes: No results for input(s): CKTOTAL, CKMB, CKMBINDEX, TROPONINI in the last 168 hours. BNP (last 3 results) No results for input(s): PROBNP in the last 8760 hours. HbA1C: No results for input(s): HGBA1C in the last 72 hours. CBG: Recent Labs  Lab 02/02/21 0040 02/02/21 0418 02/02/21 0452 02/02/21 0509 02/02/21 1153  GLUCAP 129* 62* 63* 143* 334*    Lipid Profile: No results for input(s): CHOL, HDL, LDLCALC, TRIG, CHOLHDL, LDLDIRECT in the last 72 hours. Thyroid Function Tests: No results for input(s): TSH, T4TOTAL, FREET4, T3FREE, THYROIDAB in the last 72 hours. Anemia Panel: No results for input(s): VITAMINB12, FOLATE, FERRITIN, TIBC, IRON, RETICCTPCT in the last 72 hours.  Urine analysis:    Component Value Date/Time   COLORURINE STRAW (A) 01/31/2021 1622  APPEARANCEUR CLEAR 01/31/2021 1622   LABSPEC 1.008 01/31/2021 1622   PHURINE 5.0 01/31/2021 1622   GLUCOSEU NEGATIVE 01/31/2021 1622   HGBUR MODERATE (A) 01/31/2021 1622   BILIRUBINUR NEGATIVE 01/31/2021 1622   KETONESUR NEGATIVE 01/31/2021 1622   PROTEINUR 30 (A) 01/31/2021 1622   NITRITE NEGATIVE 01/31/2021 1622   LEUKOCYTESUR NEGATIVE 01/31/2021 1622   Sepsis Labs: @LABRCNTIP (procalcitonin:4,lacticidven:4)  ) Recent Results (from the past 240 hour(s))  Culture, Urine     Status: None   Collection Time: 01/25/21  4:02 PM   Specimen: Urine, Random  Result Value Ref Range Status   Specimen Description URINE, RANDOM  Final   Special Requests NONE  Final   Culture   Final    NO GROWTH Performed at [facility], [address], [employer], [place]    Report Status 01/27/2021 FINAL  Final      Studies: DG Chest 2 View  Result Date: 02/01/2021 CLINICAL DATA:  Pneumonia EXAM: CHEST - 2 VIEW COMPARISON:  01/25/2021 FINDINGS: The  heart size and mediastinal contours are within normal limits. Left upper extremity PICC. New small bilateral pleural effusions. Similar mild, diffuse interstitial opacity. The visualized skeletal structures are unremarkable. IMPRESSION: New small bilateral pleural effusions. Similar mild, diffuse interstitial opacity, likely edema. Electronically Signed   By: 01/27/2021 M.D.   On: 02/01/2021 15:17    Scheduled Meds:  amLODipine  5 mg Oral Daily   carvedilol  25 mg Oral BID   Chlorhexidine Gluconate Cloth  6 each Topical Daily   furosemide  80 mg Intravenous Q12H   heparin  5,000 Units Subcutaneous Q8H   insulin aspart  0-15 Units Subcutaneous Q4H   insulin detemir  10 Units Subcutaneous QHS   levothyroxine  100 mcg Oral q morning   sulfaSALAzine  1,000 mg Oral BID    Continuous Infusions:  ceFEPime (MAXIPIME) IV 1 g (02/02/21 1142)     LOS: 9 days     02/04/21, MD [facility]   02/02/2021, 1:00 PM

## 2021-02-03 ENCOUNTER — Inpatient Hospital Stay (HOSPITAL_COMMUNITY): Payer: Medicare Other

## 2021-02-03 LAB — CBC WITH DIFFERENTIAL/PLATELET
Abs Immature Granulocytes: 0.19 10*3/uL — ABNORMAL HIGH (ref 0.00–0.07)
Basophils Absolute: 0.1 10*3/uL (ref 0.0–0.1)
Basophils Relative: 1 %
Eosinophils Absolute: 0.5 10*3/uL (ref 0.0–0.5)
Eosinophils Relative: 4 %
HCT: 22.3 % — ABNORMAL LOW (ref 36.0–46.0)
Hemoglobin: 6.9 g/dL — CL (ref 12.0–15.0)
Immature Granulocytes: 1 %
Lymphocytes Relative: 16 %
Lymphs Abs: 2.2 10*3/uL (ref 0.7–4.0)
MCH: 31.2 pg (ref 26.0–34.0)
MCHC: 30.9 g/dL (ref 30.0–36.0)
MCV: 100.9 fL — ABNORMAL HIGH (ref 80.0–100.0)
Monocytes Absolute: 1.8 10*3/uL — ABNORMAL HIGH (ref 0.1–1.0)
Monocytes Relative: 13 %
Neutro Abs: 8.9 10*3/uL — ABNORMAL HIGH (ref 1.7–7.7)
Neutrophils Relative %: 65 %
Platelets: 145 10*3/uL — ABNORMAL LOW (ref 150–400)
RBC: 2.21 MIL/uL — ABNORMAL LOW (ref 3.87–5.11)
RDW: 14 % (ref 11.5–15.5)
WBC: 13.8 10*3/uL — ABNORMAL HIGH (ref 4.0–10.5)
nRBC: 0.4 % — ABNORMAL HIGH (ref 0.0–0.2)

## 2021-02-03 LAB — RENAL FUNCTION PANEL
Albumin: 1.4 g/dL — ABNORMAL LOW (ref 3.5–5.0)
Anion gap: 8 (ref 5–15)
BUN: 54 mg/dL — ABNORMAL HIGH (ref 8–23)
CO2: 22 mmol/L (ref 22–32)
Calcium: 8 mg/dL — ABNORMAL LOW (ref 8.9–10.3)
Chloride: 101 mmol/L (ref 98–111)
Creatinine, Ser: 4.14 mg/dL — ABNORMAL HIGH (ref 0.44–1.00)
GFR, Estimated: 11 mL/min — ABNORMAL LOW (ref >60)
Glucose, Bld: 61 mg/dL — ABNORMAL LOW (ref 70–99)
Phosphorus: 3.5 mg/dL (ref 2.5–4.6)
Potassium: 3.4 mmol/L — ABNORMAL LOW (ref 3.5–5.1)
Sodium: 131 mmol/L — ABNORMAL LOW (ref 135–145)

## 2021-02-03 LAB — GLUCOSE, CAPILLARY
Glucose-Capillary: 100 mg/dL — ABNORMAL HIGH (ref 70–99)
Glucose-Capillary: 186 mg/dL — ABNORMAL HIGH (ref 70–99)
Glucose-Capillary: 200 mg/dL — ABNORMAL HIGH (ref 70–99)
Glucose-Capillary: 275 mg/dL — ABNORMAL HIGH (ref 70–99)
Glucose-Capillary: 42 mg/dL — CL (ref 70–99)
Glucose-Capillary: 55 mg/dL — ABNORMAL LOW (ref 70–99)
Glucose-Capillary: 77 mg/dL (ref 70–99)
Glucose-Capillary: 95 mg/dL (ref 70–99)

## 2021-02-03 LAB — PREPARE RBC (CROSSMATCH)

## 2021-02-03 LAB — ABO/RH: ABO/RH(D): A POS

## 2021-02-03 LAB — MAGNESIUM: Magnesium: 1.6 mg/dL — ABNORMAL LOW (ref 1.7–2.4)

## 2021-02-03 MED ORDER — POTASSIUM CHLORIDE 10 MEQ/100ML IV SOLN
10.0000 meq | INTRAVENOUS | Status: AC
  Administered 2021-02-03 (×2): 10 meq via INTRAVENOUS
  Filled 2021-02-03 (×2): qty 100

## 2021-02-03 MED ORDER — DEXTROSE 50 % IV SOLN
25.0000 g | INTRAVENOUS | Status: AC
  Filled 2021-02-03: qty 50

## 2021-02-03 MED ORDER — DEXTROSE 50 % IV SOLN
12.5000 g | INTRAVENOUS | Status: AC
  Administered 2021-02-03: 12.5 g via INTRAVENOUS

## 2021-02-03 MED ORDER — INSULIN DETEMIR 100 UNIT/ML ~~LOC~~ SOLN
10.0000 [IU] | Freq: Every day | SUBCUTANEOUS | Status: DC
  Administered 2021-02-03: 10 [IU] via SUBCUTANEOUS
  Filled 2021-02-03 (×2): qty 0.1

## 2021-02-03 MED ORDER — SODIUM CHLORIDE 0.9% IV SOLUTION: Freq: Once | INTRAVENOUS | Status: AC

## 2021-02-03 MED ORDER — DEXTROSE 50 % IV SOLN
INTRAVENOUS | Status: AC
  Administered 2021-02-03: 50 mL
  Filled 2021-02-03: qty 50

## 2021-02-03 MED ORDER — NITROGLYCERIN 2 % TD OINT
0.5000 [in_us] | TOPICAL_OINTMENT | Freq: Four times a day (QID) | TRANSDERMAL | Status: AC
  Administered 2021-02-03 – 2021-02-04 (×2): 0.5 [in_us] via TOPICAL
  Filled 2021-02-03: qty 30

## 2021-02-03 NOTE — Progress Notes (Signed)
[facility] night shift telemetry coverage note.  The nursing staff communicated to [place] the patient's hemoglobin level this morning was 6.9 g/dL.  Yesterday morning it was 8.3 g/dL.  Type and screen and a single PRBC unit blood transfusion has been ordered.  We will continue to monitor her hemoglobin level.  Sanda Klein, MD.

## 2021-02-03 NOTE — Progress Notes (Signed)
OT Cancellation Note  Patient Details Name: Ariana Wilkinson MRN: [id number] DOB: Dec 30, 1950   Cancelled Treatment:    Reason Eval/Treat Not Completed: Medical issues which prohibited therapy.  Spoke with RN, pt. With lower hemoglobin today and transfusion pending.      Alessandra Bevels Lorraine-COTA/L 02/03/2021, 11:11 AM

## 2021-02-03 NOTE — Significant Event (Signed)
Rapid Response Event Note   Reason for Call :  Asked to see pt as 2nd set of eyes for c/o difficulty breathing. Per RN, pt appears comfortable in bed and VSS- T-98.5, HR-83, BP-148/51, RR-20, SpO2-95% on 3L [place].   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 Pt alert and oriented, c/o chest pain associated with reflux(she says she needs her prilosec) and SOB. Pt breathing is slightly tachpneic and mildly labored. Lungs with scattered crackles t/o. ABD large/obese, soft. Skin warm to touch, generalized edema present.  T-97.9, HR-85, BP-157/67, RR-22, SpO2-97% on 3L [place].   Interventions:  EKG-NSR PCXR-Small left pleural effusion with left lower lobe infiltrate, both improved slightly since prior study. Small right pleural effusion. 2000 dose of 80mg  IV lasix given early   Plan of Care:  Pt breathing looks better after lasix administration. Continue to monitor pt. Call RRT if further assistance needed.   Event Summary:   MD Notified: Dr.  Call Time:1901 Arrival Time:2000(RRT in an emergency at time of call) End Time:2100  Robb Matar, RN

## 2021-02-03 NOTE — Progress Notes (Signed)
PROGRESS NOTE  Ariana TARMAN WUJ:[id number] DOB: 1950/08/25 DOA: 01/24/2021 PCP: Barbette Reichmann, MD  HPI/Recap of past 83 hours: 70 year old female with past medical history of diabetes mellitus with insulin pump with peripheral neuropathy and hypertension who underwent a lumbar fusion surgery on 5/18 readmitted on 6/3 for postinfection abscess with infection site growing out Pseudomonas.  Patient started on IV cefepime and discharged on 6/8, but then presented to the emergency room on 6/15 at [facility] with creatinine of 4.2 and CT of abdomen noting post renal obstruction from distended bladder.  Given lack of nephrology services at [place], patient transferred to [facility] health to [facility] on 6/16.  Nephrology consulted.  Patient's creatinine peaked at 5.05 on 6/18 and since then has been slowly trending downward.  Patient's acute kidney injury felt to be multifactorial from urinary retention from obstruction as well as poor p.o. intake plus large urinary tract infection.  Has been responding to bicarb and fluids plus antibiotics with creatinine slowly improving.  Lasix initiated on 6/22.  To date has diuresed almost 5 L of fluid and creatinine has stayed around 4.06 for the last 2 days.  Patient continues to have little appetite, still quite nauseated.  Noted white count had been trending upward and procalcitonin level checked on 6/23 elevated at 3.98, although down from 6/20.  Urinalysis negative.  Chest x-ray noting mild effusions.  This morning, hemoglobin dropped from 8.3 down to 6.9.  Creatinine slightly rose from 4.05 to 4.14.  Patient ordered 1 unit packed red blood cells.  She has been on subcu heparin for DVT prophylaxis which was discontinued and changed to SCDs.  Patient states she was logical but short of breath earlier, better now that she is getting blood.  Appetite has been a little bit better with her getting scheduled Zofran.  Assessment/Plan: Principal Problem:    AKI (acute kidney injury) [facility]): Appreciate nephrology help.  Continue fluids and cefepime.  Creatinine has started to stabilize around 4.  This might be her new normal.  Slight increase may be due to blood loss anemia?  Continue diuresis. Active Problems:   Acute urinary retention   Metabolic acidosis, normal anion gap (NAG)    Acute metabolic encephalopathy: Likely uremia.  Seems to have much improved as her renal function improves also.  Hopefully renal function will continue to improve  Hypothyroidism: On Synthroid, changed back to p.o.    Overweight (BMI 25.0-29.9): Patient meets criteria BMI greater than 25.    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  Noting that patient's A1c is rather low at 4.8.  Noted noon sugar today at 334.  Increase Levemir back    Essential hypertension: Blood pressures ranging from 130s to 150s.  Had some episodes of hypotension, likely in part due to getting all of her meds IV.  Will change back to p.o. which should help space this out.  Suspected protein-calorie malnutrition [facility]): Nutrition consulted unable to assess due to volume overload  Nausea/vomiting: Secondary to uremia.  Abdominal x-ray notes no evidence of impaction or ileus.  We will try adding some scheduled Zofran to as needed  Urinary tract infection: Urine cultures with no growth although patient had been on IV antibiotics from previous hospitalization.   Anemia: In part due to chronic disease, some macrocytosis but then noted acute drop this morning with slight rise in creatinine.  Discontinue subcu heparin and started SCDs.  Patient getting 1 unit of blood. Surgical site  infection/leukocytosis: Noted upward trend.  Procalcitonin level overall trending downward.  Code Status: Full code  Family Communication: Husband at the bedside  Disposition Plan: Likely would benefit from skilled nursing, but husband is more insistent on home with home  health upon discharge.  Plan to discharge once renal function hopefully better stabilized and patient is fully diuresed.   Consultants: Nephrology  Procedures: None  Antimicrobials: IV cefepime 6/16-6/23  DVT prophylaxis: SCDs  Level of care: Telemetry Medical   Objective: Vitals:   02/03/21 0458 02/03/21 0855  BP: (!) 132/43 (!) 130/51  Pulse: 78 80  Resp: 16 18  Temp: 97.6 F (36.4 C) 98 F (36.7 C)  SpO2: 95% 96%    Intake/Output Summary (Last 24 hours) at 02/03/2021 1005 Last data filed at 02/03/2021 0855 Gross per 24 hour  Intake 600 ml  Output 1900 ml  Net -1300 ml    Filed Weights   01/30/21 0619 01/30/21 2152 02/01/21 0131  Weight: 64.5 kg 69.2 kg 63.7 kg   Body mass index is 28.36 kg/m.  Exam:  General: Alert and oriented x2, fatigued HEENT: Normocephalic and atraumatic, mucous membranes are slightly dry Cardiovascular: Regular rate and rhythm, S1-S2 no rubs Respiratory: Clear to auscultation bilaterally, slightly tachypneic Abdomen: Soft, obese, nontender, few high-pitched bowel sounds Musculoskeletal: No clubbing or cyanosis, 2+ pitting edema with some weeping Skin: No skin breaks, tears or lesions Psychiatry: Appropriate, no evidence of psychoses Neurology: Decreased chronic peripheral neuropathy, otherwise no focal deficits   Data Reviewed: CBC: Recent Labs  Lab 01/30/21 0400 01/31/21 0507 02/01/21 0414 02/02/21 0407 02/03/21 0444  WBC 11.7* 13.6* 13.0* 14.2* 13.8*  NEUTROABS 8.4* 10.0* 9.2* 9.7* 8.9*  HGB 8.4* 8.7* 8.1* 8.3* 6.9*  HCT 27.3* 27.6* 25.3* 26.1* 22.3*  MCV 102.6* 100.0 99.2 100.0 100.9*  PLT 244 233 198 187 145*    Basic Metabolic Panel: Recent Labs  Lab 01/30/21 0400 01/31/21 0507 01/31/21 0946 02/01/21 0414 02/02/21 0407 02/03/21 0444  NA 131*  --  133* 132* 129* 131*  K 3.9  --  3.7 3.4* 3.6 3.4*  CL 103  --  105 101 98 101  CO2 19*  --  20* 21* 20* 22  GLUCOSE 109*  --  136* 143* 63* 61*  BUN 49*  --   50* 53* 52* 54*  CREATININE 4.04*  --  4.03* 4.07* 4.05* 4.14*  CALCIUM 7.9*  --  8.3* 8.7* 8.4* 8.0*  MG 1.9 1.9  --  1.8 1.7 1.6*  PHOS 3.3 3.8  --  3.9  4.1 3.5  3.6 3.5    GFR: Estimated Creatinine Clearance: 10.4 mL/min (A) (by C-G formula based on SCr of 4.14 mg/dL (H)). Liver Function Tests: Recent Labs  Lab 01/28/21 0313 01/29/21 0407 01/30/21 0400 02/01/21 0414 02/02/21 0407 02/03/21 0444  AST 15 14* 12*  --   --   --   ALT 8 9 8   --   --   --   ALKPHOS 76 67 64  --   --   --   BILITOT 0.4 0.5 0.4  --   --   --   PROT 5.1* 4.9* 4.6*  --   --   --   ALBUMIN 1.5* 1.4* 1.4* 1.6* 1.6* 1.4*    No results for input(s): LIPASE, AMYLASE in the last 168 hours. No results for input(s): AMMONIA in the last 168 hours.  Coagulation Profile: No results for input(s): INR, PROTIME in the last 168 hours. Cardiac Enzymes:  No results for input(s): CKTOTAL, CKMB, CKMBINDEX, TROPONINI in the last 168 hours. BNP (last 3 results) No results for input(s): PROBNP in the last 8760 hours. HbA1C: No results for input(s): HGBA1C in the last 72 hours. CBG: Recent Labs  Lab 02/02/21 1153 02/02/21 1605 02/02/21 2122 02/03/21 0216 02/03/21 0316  GLUCAP 334* 232* 140* 42* 100*    Lipid Profile: No results for input(s): CHOL, HDL, LDLCALC, TRIG, CHOLHDL, LDLDIRECT in the last 72 hours. Thyroid Function Tests: No results for input(s): TSH, T4TOTAL, FREET4, T3FREE, THYROIDAB in the last 72 hours. Anemia Panel: No results for input(s): VITAMINB12, FOLATE, FERRITIN, TIBC, IRON, RETICCTPCT in the last 72 hours.  Urine analysis:    Component Value Date/Time   COLORURINE STRAW (A) 01/31/2021 1622   APPEARANCEUR CLEAR 01/31/2021 1622   LABSPEC 1.008 01/31/2021 1622   PHURINE 5.0 01/31/2021 1622   GLUCOSEU NEGATIVE 01/31/2021 1622   HGBUR MODERATE (A) 01/31/2021 1622   BILIRUBINUR NEGATIVE 01/31/2021 1622   KETONESUR NEGATIVE 01/31/2021 1622   PROTEINUR 30 (A) 01/31/2021 1622    NITRITE NEGATIVE 01/31/2021 1622   LEUKOCYTESUR NEGATIVE 01/31/2021 1622   Sepsis Labs: @LABRCNTIP (procalcitonin:4,lacticidven:4)  ) Recent Results (from the past 240 hour(s))  Culture, Urine     Status: None   Collection Time: 01/25/21  4:02 PM   Specimen: Urine, Random  Result Value Ref Range Status   Specimen Description URINE, RANDOM  Final   Special Requests NONE  Final   Culture   Final    NO GROWTH Performed at [facility], [address], [place], [place]    Report Status 01/27/2021 FINAL  Final      Studies: No results found.  Scheduled Meds:  amLODipine  5 mg Oral Daily   carvedilol  25 mg Oral BID   Chlorhexidine Gluconate Cloth  6 each Topical Daily   dextrose  25 g Intravenous STAT   furosemide  80 mg Intravenous Q12H   heparin  5,000 Units Subcutaneous Q8H   insulin aspart  0-15 Units Subcutaneous Q4H   insulin detemir  20 Units Subcutaneous QHS   levothyroxine  100 mcg Oral Q0600   ondansetron (ZOFRAN) IV  4 mg Intravenous Q6H   sulfaSALAzine  1,000 mg Oral BID    Continuous Infusions:  ceFEPime (MAXIPIME) IV 1 g (02/02/21 1142)   potassium chloride     promethazine (PHENERGAN) injection (IM or IVPB) 6.25 mg (02/02/21 1751)     LOS: 10 days     02/04/21, MD [facility]   02/03/2021, 10:05 AM

## 2021-02-03 NOTE — Progress Notes (Signed)
TR[facility] night shift telemetry coverage note.  RRT saw the patient earlier due to dyspnea.  Her vital signs were T-98.5, HR-83, BP-148/51, RR-20, SpO2-95% on 3L [place].  The pain is substernal and nonradiating.  The pain is similar to her GERD symptoms.  Her lungs sounds are clear, but she has  poor inspiratory effort.  Cardiovascularly S1-S2, RRR.  Abdomen positive for mild epi and supra gastric tenderness.  Extremities show 2+ edema.  Dyspnea and atypical chest pain EKG is unchanged. Troponin level is mildly elevated. Likely demand ischemia plus decreased renal clearance. Nitropaste 0.5 inch to ACW. Famotidine 20 mg IV and resume PPI.  Hypomagnesemia Mag sulfate half a gram IVPB x1.  Sanda Klein, MD.

## 2021-02-03 NOTE — Progress Notes (Signed)
[facility] ROUNDING NOTE   Subjective:   Interval History: This is a 70 year old lady with history of diabetes mellitus type 1.  Status post lumbar fusion L5-S1 12/26/2020.  Readmitted for infection abscess and washout 01/11/2021.  Discharged 01/16/2021.  Presented to [facility] 01/23/2021 with an elevated creatinine.  Baseline creatinine about 1.2 to 1.4 mg/dL.  Suspected sepsis UTI.  No hematuria proteinuria.  Suspected volume depletion getting IV fluids.  Creatinine unchanged  Still feels weak.  Great urine output 1.6 L 02/02/2021  Blood pressure 132/43 pulse 75 temperature 97.6 O2 sats 95% room air  Sodium 131 potassium 3.4 chloride 101 CO2 22 BUN 34 creatinine 4.14 glucose 61 calcium 8 phosphorus 3.5 magnesium 1.6 hemoglobin 6.9  Lasix 80 mg IV every 12 hours, insulin, levothyroxine, Lopressor IV.  IV cefepime  Objective:  Vital signs in last 24 hours:  Temp:  [97.6 F (36.4 C)-98.4 F (36.9 C)] 97.6 F (36.4 C) (06/26 0458) Pulse Rate:  [78-101] 78 (06/26 0458) Resp:  [16-18] 16 (06/26 0458) BP: (129-169)/(37-58) 132/43 (06/26 0458) SpO2:  [93 %-95 %] 95 % (06/26 0458)  Weight change:  Filed Weights   01/30/21 0619 01/30/21 2152 02/01/21 0131  Weight: 64.5 kg 69.2 kg 63.7 kg    Intake/Output: I/O last 3 completed shifts: In: 940 [P.O.:940] Out: 3050 [Urine:3050]   Intake/Output this shift:  No intake/output data recorded.  Gen no distress, a little more receptive to noise, pt is blind No rash, cyanosis or gangrene Sclera anicteric, throat clear No jvd or bruits Chest clear bilat to bases, no rales/ wheezing RRR no MRG Abd soft ntnd no mass or ascites +bs GU foley cath draining light yellow clear urine MS no joint effusions or deformity Ext no LE or UE edema, no wounds or ulcers Neuro generally weak   Basic Metabolic Panel: Recent Labs  Lab 01/30/21 0400 01/31/21 0507 01/31/21 0946 02/01/21 0414 02/02/21 0407 02/03/21 0444  NA  131*  --  133* 132* 129* 131*  K 3.9  --  3.7 3.4* 3.6 3.4*  CL 103  --  105 101 98 101  CO2 19*  --  20* 21* 20* 22  GLUCOSE 109*  --  136* 143* 63* 61*  BUN 49*  --  50* 53* 52* 54*  CREATININE 4.04*  --  4.03* 4.07* 4.05* 4.14*  CALCIUM 7.9*  --  8.3* 8.7* 8.4* 8.0*  MG 1.9 1.9  --  1.8 1.7 1.6*  PHOS 3.3 3.8  --  3.9  4.1 3.5  3.6 3.5     Liver Function Tests: Recent Labs  Lab 01/28/21 0313 01/29/21 0407 01/30/21 0400 02/01/21 0414 02/02/21 0407 02/03/21 0444  AST 15 14* 12*  --   --   --   ALT 8 9 8   --   --   --   ALKPHOS 76 67 64  --   --   --   BILITOT 0.4 0.5 0.4  --   --   --   PROT 5.1* 4.9* 4.6*  --   --   --   ALBUMIN 1.5* 1.4* 1.4* 1.6* 1.6* 1.4*    No results for input(s): LIPASE, AMYLASE in the last 168 hours. No results for input(s): AMMONIA in the last 168 hours.   CBC: Recent Labs  Lab 01/30/21 0400 01/31/21 0507 02/01/21 0414 02/02/21 0407 02/03/21 0444  WBC 11.7* 13.6* 13.0* 14.2* 13.8*  NEUTROABS 8.4* 10.0* 9.2* 9.7* 8.9*  HGB 8.4* 8.7* 8.1* 8.3* 6.9*  HCT 27.3* 27.6* 25.3* 26.1* 22.3*  MCV 102.6* 100.0 99.2 100.0 100.9*  PLT 244 233 198 187 145*     Cardiac Enzymes: No results for input(s): CKTOTAL, CKMB, CKMBINDEX, TROPONINI in the last 168 hours.  BNP: Invalid input(s): POCBNP  CBG: Recent Labs  Lab 02/02/21 1153 02/02/21 1605 02/02/21 2122 02/03/21 0216 02/03/21 0316  GLUCAP 334* 232* 140* 42* 100*     Microbiology: Results for orders placed or performed during the hospital encounter of 01/24/21  Culture, Urine     Status: None   Collection Time: 01/25/21  4:02 PM   Specimen: Urine, Random  Result Value Ref Range Status   Specimen Description URINE, RANDOM  Final   Special Requests NONE  Final   Culture   Final    NO GROWTH Performed at [facility] Lab, [address] [place], [place] [address]    Report Status 01/27/2021 FINAL  Final    Coagulation Studies: No results for input(s): LABPROT, INR in  the last 72 hours.  Urinalysis: Recent Labs    01/31/21 1622  COLORURINE STRAW*  LABSPEC 1.008  PHURINE 5.0  GLUCOSEU NEGATIVE  HGBUR MODERATE*  BILIRUBINUR NEGATIVE  KETONESUR NEGATIVE  PROTEINUR 30*  NITRITE NEGATIVE  LEUKOCYTESUR NEGATIVE       Imaging: DG Chest 2 View  Result Date: 02/01/2021 CLINICAL DATA:  Pneumonia EXAM: CHEST - 2 VIEW COMPARISON:  01/25/2021 FINDINGS: The heart size and mediastinal contours are within normal limits. Left upper extremity PICC. New small bilateral pleural effusions. Similar mild, diffuse interstitial opacity. The visualized skeletal structures are unremarkable. IMPRESSION: New small bilateral pleural effusions. Similar mild, diffuse interstitial opacity, likely edema. Electronically Signed   By: Lauralyn Primes M.D.   On: 02/01/2021 15:17     Medications:    ceFEPime (MAXIPIME) IV 1 g (02/02/21 1142)   promethazine (PHENERGAN) injection (IM or IVPB) 6.25 mg (02/02/21 1751)    amLODipine  5 mg Oral Daily   carvedilol  25 mg Oral BID   Chlorhexidine Gluconate Cloth  6 each Topical Daily   dextrose  25 g Intravenous STAT   furosemide  80 mg Intravenous Q12H   heparin  5,000 Units Subcutaneous Q8H   insulin aspart  0-15 Units Subcutaneous Q4H   insulin detemir  20 Units Subcutaneous QHS   levothyroxine  100 mcg Oral Q0600   ondansetron (ZOFRAN) IV  4 mg Intravenous Q6H   sulfaSALAzine  1,000 mg Oral BID   acetaminophen, oxyCODONE, promethazine (PHENERGAN) injection (IM or IVPB), senna-docusate, sodium chloride flush  Assessment/ Plan:  Acute kidney injury Baseline serum creatinine 1.2 to 1.4 mg deciliter increasing creatinine on admission.  Making urine.  An active urine sediment.  Foley catheter in place.  Creatinine appears a little improved this morning hopefully will plateaued. Appears to be markedly volume overloaded.  Appears to be diuresing fairly well with 80 mg every 12 hours of Lasix will maintain at this point.  She is not a  dialysis candidate goals of care with be appropriate involving the assistance of hospice.  Blood pressure appears to be improving. Recent lumbar fusion treated with Zyvox and cefepime for 4 weeks.  Complicated by postop infection.  Positive for Pseudomonas antibiotics now stopped. Diabetes mellitus type 1 Long history. Hypertension blood pressure controlled Hypothyroidism on replacement therapy Hypokalemia improved.  Continue to follow.  Will replete   LOS: 10 [name] @TODAY @9 :06 AM

## 2021-02-04 ENCOUNTER — Ambulatory Visit: Payer: Medicare Other | Admitting: Internal Medicine

## 2021-02-04 LAB — BASIC METABOLIC PANEL
Anion gap: 9 (ref 5–15)
BUN: 53 mg/dL — ABNORMAL HIGH (ref 8–23)
CO2: 21 mmol/L — ABNORMAL LOW (ref 22–32)
Calcium: 7.9 mg/dL — ABNORMAL LOW (ref 8.9–10.3)
Chloride: 101 mmol/L (ref 98–111)
Creatinine, Ser: 4.42 mg/dL — ABNORMAL HIGH (ref 0.44–1.00)
GFR, Estimated: 10 mL/min — ABNORMAL LOW (ref >60)
Glucose, Bld: 201 mg/dL — ABNORMAL HIGH (ref 70–99)
Potassium: 3.8 mmol/L (ref 3.5–5.1)
Sodium: 131 mmol/L — ABNORMAL LOW (ref 135–145)

## 2021-02-04 LAB — CBC WITH DIFFERENTIAL/PLATELET
Abs Immature Granulocytes: 0.35 10*3/uL — ABNORMAL HIGH (ref 0.00–0.07)
Basophils Absolute: 0.2 10*3/uL — ABNORMAL HIGH (ref 0.0–0.1)
Basophils Relative: 1 %
Eosinophils Absolute: 0.5 10*3/uL (ref 0.0–0.5)
Eosinophils Relative: 4 %
HCT: 28.6 % — ABNORMAL LOW (ref 36.0–46.0)
Hemoglobin: 9.4 g/dL — ABNORMAL LOW (ref 12.0–15.0)
Immature Granulocytes: 3 %
Lymphocytes Relative: 14 %
Lymphs Abs: 2 10*3/uL (ref 0.7–4.0)
MCH: 31.4 pg (ref 26.0–34.0)
MCHC: 32.9 g/dL (ref 30.0–36.0)
MCV: 95.7 fL (ref 80.0–100.0)
Monocytes Absolute: 1.6 10*3/uL — ABNORMAL HIGH (ref 0.1–1.0)
Monocytes Relative: 11 %
Neutro Abs: 9.4 10*3/uL — ABNORMAL HIGH (ref 1.7–7.7)
Neutrophils Relative %: 67 %
Platelets: 164 10*3/uL (ref 150–400)
RBC: 2.99 MIL/uL — ABNORMAL LOW (ref 3.87–5.11)
RDW: 15.6 % — ABNORMAL HIGH (ref 11.5–15.5)
WBC: 14 10*3/uL — ABNORMAL HIGH (ref 4.0–10.5)
nRBC: 0.2 % (ref 0.0–0.2)

## 2021-02-04 LAB — CULTURE, BLOOD (ROUTINE X 2)
Culture: NO GROWTH
Culture: NO GROWTH
Special Requests: ADEQUATE
Special Requests: ADEQUATE

## 2021-02-04 LAB — TYPE AND SCREEN
ABO/RH(D): A POS
Antibody Screen: NEGATIVE
Unit division: 0

## 2021-02-04 LAB — MAGNESIUM: Magnesium: 1.6 mg/dL — ABNORMAL LOW (ref 1.7–2.4)

## 2021-02-04 LAB — RENAL FUNCTION PANEL
Albumin: 1.5 g/dL — ABNORMAL LOW (ref 3.5–5.0)
Anion gap: 10 (ref 5–15)
BUN: 54 mg/dL — ABNORMAL HIGH (ref 8–23)
CO2: 21 mmol/L — ABNORMAL LOW (ref 22–32)
Calcium: 7.9 mg/dL — ABNORMAL LOW (ref 8.9–10.3)
Chloride: 100 mmol/L (ref 98–111)
Creatinine, Ser: 4.37 mg/dL — ABNORMAL HIGH (ref 0.44–1.00)
GFR, Estimated: 10 mL/min — ABNORMAL LOW (ref >60)
Glucose, Bld: 137 mg/dL — ABNORMAL HIGH (ref 70–99)
Phosphorus: 3.7 mg/dL (ref 2.5–4.6)
Potassium: 3.7 mmol/L (ref 3.5–5.1)
Sodium: 131 mmol/L — ABNORMAL LOW (ref 135–145)

## 2021-02-04 LAB — BPAM RBC
Blood Product Expiration Date: 202207182359
ISSUE DATE / TIME: 202206261339
Unit Type and Rh: 6200

## 2021-02-04 LAB — TROPONIN I (HIGH SENSITIVITY)
Troponin I (High Sensitivity): 75 ng/L — ABNORMAL HIGH (ref <18)
Troponin I (High Sensitivity): 80 ng/L — ABNORMAL HIGH (ref <18)

## 2021-02-04 LAB — GLUCOSE, CAPILLARY
Glucose-Capillary: 108 mg/dL — ABNORMAL HIGH (ref 70–99)
Glucose-Capillary: 67 mg/dL — ABNORMAL LOW (ref 70–99)
Glucose-Capillary: 91 mg/dL (ref 70–99)
Glucose-Capillary: 103 mg/dL — ABNORMAL HIGH (ref 70–99)
Glucose-Capillary: 245 mg/dL — ABNORMAL HIGH (ref 70–99)
Glucose-Capillary: 205 mg/dL — ABNORMAL HIGH (ref 70–99)

## 2021-02-04 MED ORDER — INSULIN ASPART 100 UNIT/ML IJ SOLN
0.0000 [IU] | Freq: Every day | INTRAMUSCULAR | Status: DC
Start: 2021-02-04 — End: 2021-02-08
  Administered 2021-02-04 – 2021-02-07 (×4): 2 [IU] via SUBCUTANEOUS

## 2021-02-04 MED ORDER — LEVOTHYROXINE SODIUM 25 MCG PO TABS
125.0000 ug | ORAL_TABLET | Freq: Every day | ORAL | Status: DC
  Administered 2021-02-05 – 2021-02-08 (×4): 125 ug via ORAL
  Filled 2021-02-04 (×4): qty 1

## 2021-02-04 MED ORDER — FAMOTIDINE IN NACL 20-0.9 MG/50ML-% IV SOLN
20.0000 mg | Freq: Once | INTRAVENOUS | Status: AC
  Administered 2021-02-04: 20 mg via INTRAVENOUS
  Filled 2021-02-04: qty 50

## 2021-02-04 MED ORDER — ALBUMIN HUMAN 25 % IV SOLN
12.5000 g | Freq: Once | INTRAVENOUS | Status: AC
  Administered 2021-02-05: 12.5 g via INTRAVENOUS
  Filled 2021-02-04: qty 50

## 2021-02-04 MED ORDER — INSULIN ASPART 100 UNIT/ML IJ SOLN
0.0000 [IU] | Freq: Three times a day (TID) | INTRAMUSCULAR | Status: DC
  Administered 2021-02-04: 5 [IU] via SUBCUTANEOUS
  Administered 2021-02-05: 3 [IU] via SUBCUTANEOUS
  Administered 2021-02-05: 5 [IU] via SUBCUTANEOUS
  Administered 2021-02-06: 3 [IU] via SUBCUTANEOUS
  Administered 2021-02-06: 5 [IU] via SUBCUTANEOUS
  Administered 2021-02-07: 3 [IU] via SUBCUTANEOUS

## 2021-02-04 MED ORDER — ALBUMIN HUMAN 5 % IV SOLN
12.5000 g | Freq: Once | INTRAVENOUS | Status: AC
  Administered 2021-02-04: 12.5 g via INTRAVENOUS
  Filled 2021-02-04: qty 250

## 2021-02-04 MED ORDER — PANTOPRAZOLE SODIUM 40 MG PO TBEC
40.0000 mg | DELAYED_RELEASE_TABLET | Freq: Every day | ORAL | Status: DC
  Administered 2021-02-04 – 2021-02-08 (×5): 40 mg via ORAL
  Filled 2021-02-04 (×5): qty 1

## 2021-02-04 MED ORDER — LEVOTHYROXINE SODIUM 25 MCG PO TABS
25.0000 ug | ORAL_TABLET | Freq: Once | ORAL | Status: AC
  Administered 2021-02-04: 25 ug via ORAL
  Filled 2021-02-04: qty 1

## 2021-02-04 MED ORDER — MAGNESIUM SULFATE 50 % IJ SOLN
0.5000 g | Freq: Once | INTRAVENOUS | Status: AC
  Administered 2021-02-04: 0.5 g via INTRAVENOUS
  Filled 2021-02-04 (×2): qty 1

## 2021-02-04 MED ORDER — INSULIN DETEMIR 100 UNIT/ML ~~LOC~~ SOLN
8.0000 [IU] | Freq: Every day | SUBCUTANEOUS | Status: DC
  Administered 2021-02-04 – 2021-02-06 (×3): 8 [IU] via SUBCUTANEOUS
  Filled 2021-02-04 (×4): qty 0.08

## 2021-02-04 NOTE — Progress Notes (Signed)
Inpatient Diabetes Program Recommendations  AACE/ADA: New Consensus Statement on Inpatient Glycemic Control   Target Ranges:  Prepandial:   less than 140 mg/dL      Peak postprandial:   less than 180 mg/dL (1-2 hours)      Critically ill patients:  140 - 180 mg/dL   Results for NICHELLE, RENWICK (MRN [id number]) as of 02/04/2021 11:38  Ref. Range 02/03/2021 06:43 02/03/2021 12:00 02/03/2021 17:03 02/03/2021 20:08 02/03/2021 22:54 02/03/2021 23:51 02/04/2021 03:54 02/04/2021 07:25 02/04/2021 07:58  Glucose-Capillary Latest Ref Range: 70 - 99 mg/dL 77 95 809 (H)  Novolog 8 units 200 (H)       Levemir 10 units 186 (H)  Novolog 3 units 108 (H) 67 (L) 91    Review of Glycemic Control  Current orders for Inpatient glycemic control: Levemir 10 units QHS, Novolog 0-15 units Q4H   Inpatient Diabetes Program Recommendations:     Insulin: Glucose 67 mg/dl today. May want to consider changing decreasing Levemir to 8 units QHS, decreasing frequency of CBGs to AC&HS and Novolog 0-15 units TID with meals and Novolog 0-5 units QHS.  Thanks, Orlando Penner, RN, MSN, CDE Diabetes Coordinator Inpatient Diabetes Program [phone] (Team Pager from 8am to 5pm)

## 2021-02-04 NOTE — Progress Notes (Signed)
PROGRESS NOTE  Ariana Wilkinson XFG:[id number] DOB: 1951/07/12 DOA: 01/24/2021 PCP: Barbette Reichmann, MD  HPI/Recap of past 16 hours: 70 year old female with past medical history of diabetes mellitus with insulin pump with peripheral neuropathy and hypertension who underwent a lumbar fusion surgery on 5/18 readmitted on 6/3 for postinfection abscess with infection site growing out Pseudomonas.  Patient started on IV cefepime and discharged on 6/8, but then presented to the emergency room on 6/15 at [facility] with creatinine of 4.2 and CT of abdomen noting post renal obstruction from distended bladder.  Given lack of nephrology services at [facility], patient transferred to [facility] health to [facility] service on 6/16.  Nephrology consulted.  Patient's creatinine peaked at 5.05 on 6/18 and since then has been slowly trending downward.  Patient's acute kidney injury felt to be multifactorial from urinary retention from obstruction as well as poor p.o. intake plus large urinary tract infection.  Has been responding to bicarb and fluids plus antibiotics with creatinine slowly improving.  Lasix initiated on 6/22.  To date has diuresed over 10 L of fluid and creatinine have leveled off around 4 until patient had blood loss with creatinine worsened to 4.4, but following blood, down to 4.3.  Patient continues to have little appetite, still quite nauseated.  Patient's hemoglobin dropped on 6/26 from 8.3 down to 6.9.  On subcu heparin for DVT prophylaxis which was discontinued and changed to SCDs.  Patient received 1 unit packed red blood cells.  Hemoglobin up to 9.4 today.    Had episode overnight of shortness of breath and found to be slightly wet, likely from additional blood given.  Continued on Lasix.  Have ordered some albumin for this morning to help.  Patient states nausea little bit under better control.  Breathing still labored some.  Assessment/Plan: Principal Problem:   AKI (acute kidney injury)  [facility]): Appreciate nephrology help.  Continue fluids and cefepime.  Creatinine has started to stabilize around 4, although slight drop following blood loss and mild improvement after blood transfused.  This might be her new normal.  Slight increase may be due to blood loss anemia?  Continue diuresis.  Have added some IV albumin Active Problems:   Acute urinary retention   Metabolic acidosis, normal anion gap (NAG)    Acute metabolic encephalopathy: Likely uremia.  Seems to have much improved as her renal function improves also.  Hopefully renal function will continue to improve  Hypothyroidism: On Synthroid, changed back to p.o.    Overweight (BMI 25.0-29.9): Patient meets criteria BMI greater than 25.    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  Noting that patient's A1c is rather low at 4.8.  Adjusted sliding scale to 3 times daily plus at bedtime and slightly decreased Levemir.  Appreciate diabetic educator help    Essential hypertension: Blood pressures ranging from 130s to 150s.  Had some episodes of hypotension, likely in part due to getting all of her meds IV.  Will change back to p.o. which should help space this out.  Suspected protein-calorie malnutrition [facility]): Nutrition consulted unable to assess due to volume overload  Nausea/vomiting: Secondary to uremia.  Abdominal x-ray notes no evidence of impaction or ileus.  Adding scheduled Zofran seems to have helped.  Urinary tract infection: Urine cultures with no growth although patient had been on IV antibiotics from previous hospitalization.   Suspected acute blood loss anemia: In part due to chronic disease, some macrocytosis but then noted acute  drop this morning with slight rise in creatinine.  Discontinue subcu heparin and started SCDs.  Patient getting 1 unit of blood.  Surgical site infection/leukocytosis: Noted upward trend.  Procalcitonin level overall trending downward.  Recheck  in the morning.  Patient due for suture removal tomorrow in the office.  May evaluate and either take out sutures here if patient is going to be here for at least another week  Code Status: Full code  Family Communication: Updated husband  Disposition Plan: Likely would benefit from skilled nursing, but husband is more insistent on home with home health upon discharge.  Plan to discharge once renal function hopefully better stabilized and patient is fully diuresed.   Consultants: Nephrology  Procedures: None  Antimicrobials: IV cefepime 6/16-6/23  DVT prophylaxis: SCDs  Level of care: Telemetry Medical   Objective: Vitals:   02/04/21 0538 02/04/21 1002  BP: (!) 146/55 (!) 131/53  Pulse: 75 75  Resp: 18 16  Temp: 98 F (36.7 C) 98 F (36.7 C)  SpO2: 99% 95%    Intake/Output Summary (Last 24 hours) at 02/04/2021 1204 Last data filed at 02/04/2021 0900 Gross per 24 hour  Intake 635 ml  Output 1675 ml  Net -1040 ml    Filed Weights   01/30/21 2152 02/01/21 0131 02/04/21 0539  Weight: 69.2 kg 63.7 kg 61.1 kg   Body mass index is 27.21 kg/m.  Exam:  General: Alert and oriented x2, fatigued HEENT: Normocephalic and atraumatic, mucous membranes are slightly dry Cardiovascular: Regular rate and rhythm, S1-S2 no rubs Respiratory: Clear to auscultation bilaterally, slightly tachypneic, bibasilar crackles Abdomen: Soft, obese, nontender, few high-pitched bowel sounds Musculoskeletal: No clubbing or cyanosis, 2+ pitting edema Skin: No skin breaks, tears or lesions Psychiatry: Appropriate, no evidence of psychoses Neurology: Decreased chronic peripheral neuropathy, otherwise no focal deficits   Data Reviewed: CBC: Recent Labs  Lab 01/31/21 0507 02/01/21 0414 02/02/21 0407 02/03/21 0444 02/04/21 0222  WBC 13.6* 13.0* 14.2* 13.8* 14.0*  NEUTROABS 10.0* 9.2* 9.7* 8.9* 9.4*  HGB 8.7* 8.1* 8.3* 6.9* 9.4*  HCT 27.6* 25.3* 26.1* 22.3* 28.6*  MCV 100.0 99.2 100.0  100.9* 95.7  PLT 233 198 187 145* 164    Basic Metabolic Panel: Recent Labs  Lab 01/31/21 0507 01/31/21 0946 02/01/21 0414 02/02/21 0407 02/03/21 0444 02/03/21 2356 02/04/21 0222  NA  --    < > 132* 129* 131* 131* 131*  K  --    < > 3.4* 3.6 3.4* 3.8 3.7  CL  --    < > 101 98 101 101 100  CO2  --    < > 21* 20* 22 21* 21*  GLUCOSE  --    < > 143* 63* 61* 201* 137*  BUN  --    < > 53* 52* 54* 53* 54*  CREATININE  --    < > 4.07* 4.05* 4.14* 4.42* 4.37*  CALCIUM  --    < > 8.7* 8.4* 8.0* 7.9* 7.9*  MG 1.9  --  1.8 1.7 1.6*  --  1.6*  PHOS 3.8  --  3.9  4.1 3.5  3.6 3.5  --  3.7   < > = values in this interval not displayed.    GFR: Estimated Creatinine Clearance: 9.7 mL/min (A) (by C-G formula based on SCr of 4.37 mg/dL (H)). Liver Function Tests: Recent Labs  Lab 01/29/21 0407 01/30/21 0400 02/01/21 0414 02/02/21 0407 02/03/21 0444 02/04/21 0222  AST 14* 12*  --   --   --   --  ALT 9 8  --   --   --   --   ALKPHOS 67 64  --   --   --   --   BILITOT 0.5 0.4  --   --   --   --   PROT 4.9* 4.6*  --   --   --   --   ALBUMIN 1.4* 1.4* 1.6* 1.6* 1.4* 1.5*    No results for input(s): LIPASE, AMYLASE in the last 168 hours. No results for input(s): AMMONIA in the last 168 hours.  Coagulation Profile: No results for input(s): INR, PROTIME in the last 168 hours. Cardiac Enzymes: No results for input(s): CKTOTAL, CKMB, CKMBINDEX, TROPONINI in the last 168 hours. BNP (last 3 results) No results for input(s): PROBNP in the last 8760 hours. HbA1C: No results for input(s): HGBA1C in the last 72 hours. CBG: Recent Labs  Lab 02/03/21 2351 02/04/21 0354 02/04/21 0725 02/04/21 0758 02/04/21 1156  GLUCAP 186* 108* 67* 91 103*    Lipid Profile: No results for input(s): CHOL, HDL, LDLCALC, TRIG, CHOLHDL, LDLDIRECT in the last 72 hours. Thyroid Function Tests: No results for input(s): TSH, T4TOTAL, FREET4, T3FREE, THYROIDAB in the last 72 hours. Anemia Panel: No  results for input(s): VITAMINB12, FOLATE, FERRITIN, TIBC, IRON, RETICCTPCT in the last 72 hours.  Urine analysis:    Component Value Date/Time   COLORURINE STRAW (A) 01/31/2021 1622   APPEARANCEUR CLEAR 01/31/2021 1622   LABSPEC 1.008 01/31/2021 1622   PHURINE 5.0 01/31/2021 1622   GLUCOSEU NEGATIVE 01/31/2021 1622   HGBUR MODERATE (A) 01/31/2021 1622   BILIRUBINUR NEGATIVE 01/31/2021 1622   KETONESUR NEGATIVE 01/31/2021 1622   PROTEINUR 30 (A) 01/31/2021 1622   NITRITE NEGATIVE 01/31/2021 1622   LEUKOCYTESUR NEGATIVE 01/31/2021 1622   Sepsis Labs: @LABRCNTIP (procalcitonin:4,lacticidven:4)  ) Recent Results (from the past 240 hour(s))  Culture, Urine     Status: None   Collection Time: 01/25/21  4:02 PM   Specimen: Urine, Random  Result Value Ref Range Status   Specimen Description URINE, RANDOM  Final   Special Requests NONE  Final   Culture   Final    NO GROWTH Performed at [facility] Lab, [address], [place], [place]    Report Status 01/27/2021 FINAL  Final      Studies: DG Chest Port 1 View  Result Date: 02/03/2021 CLINICAL DATA:  Acute respiratory distress EXAM: PORTABLE CHEST 1 VIEW COMPARISON:  02/01/2021 FINDINGS: Left PICC line remains in place with the tip in the upper right atrium. Small left pleural effusion with left lower lobe infiltrate, both improving since prior study. Small right pleural effusion. No confluent opacity on the right. Heart is normal size. IMPRESSION: Small left pleural effusion with left lower lobe infiltrate, both improved slightly since prior study. Small right pleural effusion. Electronically Signed   By: 02/03/2021 M.D.   On: 02/03/2021 20:39    Scheduled Meds:  amLODipine  5 mg Oral Daily   carvedilol  25 mg Oral BID   Chlorhexidine Gluconate Cloth  6 each Topical Daily   furosemide  80 mg Intravenous Q12H   insulin aspart  0-15 Units Subcutaneous TID WC   insulin aspart  0-5 Units Subcutaneous QHS   insulin  detemir  8 Units Subcutaneous QHS   levothyroxine  100 mcg Oral Q0600   ondansetron (ZOFRAN) IV  4 mg Intravenous Q6H   pantoprazole  40 mg Oral Daily   sulfaSALAzine  1,000 mg Oral  BID    Continuous Infusions:  ceFEPime (MAXIPIME) IV 1 g (02/04/21 0953)   promethazine (PHENERGAN) injection (IM or IVPB) 6.25 mg (02/03/21 1653)     LOS: 11 days     Hollice Espy, MD [facility]   02/04/2021, 12:04 PM

## 2021-02-04 NOTE — Consult Note (Signed)
PHARMACY CONSULT NOTE FOR:  OUTPATIENT  PARENTERAL ANTIBIOTIC THERAPY (OPAT)  Indication: Post-op abscess Regimen: Cefepime 1 gm Q 24 hours  End date: 02/20/21  IV antibiotic discharge orders are pended. To discharging provider:  please sign these orders via discharge navigator,  Select New Orders & click on the button choice - Manage This Unsigned Work.     Thank you for allowing pharmacy to be a part of this patient's care.  Sharin Mons, PharmD, BCPS, BCIDP Infectious Diseases Clinical Pharmacist Phone: [phone] 02/04/2021, 1:56 PM

## 2021-02-04 NOTE — Progress Notes (Addendum)
[facility] NEPHROLOGY PROGRESS NOTE  Assessment/ Plan: Pt is a 70 y.o. yo female with history of DM status post lumbar fusion surgery in 12/2020, readmitted for infection and had a washout on 6/3, presented to [facility] on 6/15 because of AKI with creatinine level of 5.05.  #Acute kidney injury on CKD with baseline creatinine level around 1.2-1.4 with recent back surgery, infection and hospitalization.  UA with minimal proteinuria, no M spike.  Kidney ultrasound with chronic findings without hydronephrosis.  Patient has been receiving IV Lasix with decent urine output.  Creatinine level 4.37 which is a stable.  Denies any uremic symptoms.  Discussed with the patient and her husband today, they do not want dialysis.  Fortunately there is no need for dialysis today, continue to manage medically.  She will benefit from palliative care consult.  She is blind and hard of hearing.  #Hypertension/volume: Continue current antihypertensive.  IV Lasix to manage volume.  #Hyponatremia, hypervolemic: Managed with IV loop diuretics.  Monitor lab.  #Anemia due to chronic disease and recent surgery/hospitalization: Received PRBC.  Hemoglobin is stable today.  Check iron studies.  #Hypoalbuminemia: Receiving albumin.  Hopefully this will help augment diuretics effect.  Subjective: Seen and examined at bedside.  Noted she had rapid response last night because of acute dyspnea.  Treated with IV diuretics.  She still has some shortness of breath.  Denies nausea vomiting, chest pain.  Her husband at bedside.  Urine output is recorded around 2 L.  Creatinine level 4.3 from 4.4 yesterday. Objective Vital signs in last 24 hours: Vitals:   02/03/21 2014 02/04/21 0538 02/04/21 0539 02/04/21 1002  BP: (!) 157/67 (!) 146/55  (!) 131/53  Pulse: 85 75  75  Resp: 20 18  16   Temp: 97.9 F (36.6 C) 98 F (36.7 C)  98 F (36.7 C)  TempSrc: Oral Oral  Oral  SpO2: 97% 99%  95%  Weight:   61.1 kg    Height:       Weight change:   Intake/Output Summary (Last 24 hours) at 02/04/2021 1116 Last data filed at 02/04/2021 0900 Gross per 24 hour  Intake 635 ml  Output 1675 ml  Net -1040 ml       Labs: Basic Metabolic Panel: Recent Labs  Lab 02/02/21 0407 02/03/21 0444 02/03/21 2356 02/04/21 0222  NA 129* 131* 131* 131*  K 3.6 3.4* 3.8 3.7  CL 98 101 101 100  CO2 20* 22 21* 21*  GLUCOSE 63* 61* 201* 137*  BUN 52* 54* 53* 54*  CREATININE 4.05* 4.14* 4.42* 4.37*  CALCIUM 8.4* 8.0* 7.9* 7.9*  PHOS 3.5  3.6 3.5  --  3.7   Liver Function Tests: Recent Labs  Lab 01/29/21 0407 01/30/21 0400 02/01/21 0414 02/02/21 0407 02/03/21 0444 02/04/21 0222  AST 14* 12*  --   --   --   --   ALT 9 8  --   --   --   --   ALKPHOS 67 64  --   --   --   --   BILITOT 0.5 0.4  --   --   --   --   PROT 4.9* 4.6*  --   --   --   --   ALBUMIN 1.4* 1.4*   < > 1.6* 1.4* 1.5*   < > = values in this interval not displayed.   No results for input(s): LIPASE, AMYLASE in the last 168 hours. No results for input(s):  AMMONIA in the last 168 hours. CBC: Recent Labs  Lab 01/31/21 0507 02/01/21 0414 02/02/21 0407 02/03/21 0444 02/04/21 0222  WBC 13.6* 13.0* 14.2* 13.8* 14.0*  NEUTROABS 10.0* 9.2* 9.7* 8.9* 9.4*  HGB 8.7* 8.1* 8.3* 6.9* 9.4*  HCT 27.6* 25.3* 26.1* 22.3* 28.6*  MCV 100.0 99.2 100.0 100.9* 95.7  PLT 233 198 187 145* 164   Cardiac Enzymes: No results for input(s): CKTOTAL, CKMB, CKMBINDEX, TROPONINI in the last 168 hours. CBG: Recent Labs  Lab 02/03/21 2008 02/03/21 2351 02/04/21 0354 02/04/21 0725 02/04/21 0758  GLUCAP 200* 186* 108* 67* 91    Iron Studies: No results for input(s): IRON, TIBC, TRANSFERRIN, FERRITIN in the last 72 hours. Studies/Results: DG Chest Port 1 View  Result Date: 02/03/2021 CLINICAL DATA:  Acute respiratory distress EXAM: PORTABLE CHEST 1 VIEW COMPARISON:  02/01/2021 FINDINGS: Left PICC line remains in place with the tip in the upper  right atrium. Small left pleural effusion with left lower lobe infiltrate, both improving since prior study. Small right pleural effusion. No confluent opacity on the right. Heart is normal size. IMPRESSION: Small left pleural effusion with left lower lobe infiltrate, both improved slightly since prior study. Small right pleural effusion. Electronically Signed   By: Charlett Nose M.D.   On: 02/03/2021 20:39    Medications: Infusions:  ceFEPime (MAXIPIME) IV 1 g (02/04/21 0953)   promethazine (PHENERGAN) injection (IM or IVPB) 6.25 mg (02/03/21 1653)    Scheduled Medications:  amLODipine  5 mg Oral Daily   carvedilol  25 mg Oral BID   Chlorhexidine Gluconate Cloth  6 each Topical Daily   furosemide  80 mg Intravenous Q12H   insulin aspart  0-15 Units Subcutaneous Q4H   insulin detemir  10 Units Subcutaneous QHS   levothyroxine  100 mcg Oral Q0600   ondansetron (ZOFRAN) IV  4 mg Intravenous Q6H   pantoprazole  40 mg Oral Daily   sulfaSALAzine  1,000 mg Oral BID    have reviewed scheduled and prn medications.  Physical Exam: General:NAD, comfortable, sitting on chair Heart:RRR, s1s2 nl, no rubs Lungs: Bibasilar crackles, no increased work of breathing Abdomen:soft, Non-tender, non-distended Extremities: Bilateral upper and lower extremities has pitting edema present Neurology: Alert, awake, following commands  Ariana Wilkinson Ariana Wilkinson 02/04/2021,11:16 AM  LOS: 11 days

## 2021-02-04 NOTE — Progress Notes (Signed)
Rapid response team contacted on 7a-7p shift about patient having SOB per nurse giving this nurse report.  Upon shift assessment patient c/o difficulty breathing.  Vital signs stable: BP 157/67, HR 85, Temp 97.9, Respirations 20, and O2 sat 97% via 3 L of O2 by nasal cannula. CBG: 200. Patient is A/O x4 and coherent. Rapid response team arrived and assessed patient. Notified staff of STAT EKG and STAT Chest X-Ray orders and instructed this nurse to administer 2200 Lasix 80 mg IV push early. This nurse administered the Lasix 80 mg IV.  After the EKG and chest X-Ray were obtained RRT notified this nurse that Dr. Robb Matar was notified that Chest X-Ray showed left pleural effusion and left lower lobe infiltrate.  Nitroglycerin 2% ointment topical ordered q6h, nurse administered medication. Troponin STAT q2h x2 ordered, IV team at bedside drawing blood from patient's PICC line.  Patient Is currently resting quietly in bed with eyes closed. No complaints at this time. Bed is in low position and call button within reach. Will continue to monitor.

## 2021-02-04 NOTE — Progress Notes (Signed)
Occupational Therapy Treatment Patient Details Name: Ariana Wilkinson MRN: [id number] DOB: 09/25/1950 Today's Date: 02/04/2021    History of present illness 70 yo female presenting to [facility] health ED on 6/15 with confusion and AKI. PMH including critical blindness, type 1 diabetes on insulin pump, s/p lumbar fusion surgery at L5-S1 on 12/26/2020 by Dr. [name], readmitted for post infection/abscess with washout surgery on 01/11/2021, infection site growing Pseudomonas sensitive to cefepime, discharged on 01/16/2021 to complete 4 weeks of IV antibiotics.   OT comments  Pt progressing towards established OT goals. Pt performing stand pivot transfer to arm chair with min A; shampooing hair with shampoo cap with mod A due to decreased activity tolerance and SOB. Noting pt requiring rest breaks during BUE exercises (10x flexion/extension wrist, elbow, shoulder; 10x composite digit flexion/extension) this session. Pt continues to present with decreased activity tolerance, strength, and balance. Continue to recommend discharge home with [facility] and will continue to follow acutely to optimize independence in ADLs.    Follow Up Recommendations  Home health OT;Supervision/Assistance - 24 hour    Equipment Recommendations  None recommended by OT    Recommendations for Other Services      Precautions / Restrictions Precautions Precautions: Fall;Back Precaution Booklet Issued: No Precaution Comments: no specific order for back precautions Restrictions Weight Bearing Restrictions: No       Mobility Bed Mobility Overal bed mobility: Needs Assistance Bed Mobility: Sit to Sidelying;Rolling Rolling: Min assist Sidelying to sit: Min assist     Sit to sidelying: Mod assist General bed mobility comments: modA for management of LE back into bed    Transfers Overall transfer level: Needs assistance Equipment used: 1 person hand held assist Transfers: Sit to/from [employer] Sit to  Stand: Min assist Stand pivot transfers: Min assist       General transfer comment: min A for stand and pivot from straight back chair to bed. Pt found to have had small BM and required min A for sit>stand for pericare    Balance Overall balance assessment: Needs assistance Sitting-balance support: No upper extremity supported;Feet supported Sitting balance-Leahy Scale: Fair     Standing balance support: Bilateral upper extremity supported Standing balance-Leahy Scale: Poor Standing balance comment: One person hand held assist                           ADL either performed or assessed with clinical judgement   ADL Overall ADL's : Needs assistance/impaired     Grooming: Moderate assistance;Sitting (shower cap) Grooming Details (indicate cue type and reason): Pt performing hair washing using shower cap with BUE. Able to use BUE to wash hair in 15 second increments (x3) before fatiguing and reporting SOB (VSS); self-initiating rest break. Requiring therapist to finish rubbing in shampoo. Pt beginning to brush hair but very fatigued, so husband brushing hair for her.                 Toilet Transfer: Minimal Cabin crew Details (indicate cue type and reason): Min A for balance. Pt requiring tactile cues for hand placement to orient to chair due to blindness.           General ADL Comments: Pt fatiguing quickly with BUE movement.     Vision   Additional Comments: Pt is legally blind   Perception     Praxis      Cognition Arousal/Alertness: Awake/alert Behavior During Therapy: WFL for tasks assessed/performed Overall Cognitive Status:  Impaired/Different from baseline Area of Impairment: Memory;Following commands;Awareness;Problem solving;Attention                   Current Attention Level: Sustained Memory: Decreased short-term memory;Decreased recall of precautions Following Commands: Follows one step commands with  increased time   Awareness: Emergent Problem Solving: Slow processing;Requires verbal cues;Requires tactile cues General Comments: Pt continues to require increased time for command follow. Multimodal cues required due to visual impairment.        Exercises Exercises: General Lower Extremity General Exercises - Lower Extremity Ankle Circles/Pumps: AROM;Both;10 reps;Supine (2 sets) Heel Slides: AROM;Both;10 reps;Supine Straight Leg Raises: AROM;Both;10 reps;Supine (in comfortable range about 6inches off bed) Shoulder Exercises Shoulder Flexion: AROM;Right;10 reps;Seated Elbow Flexion: AROM;10 reps;Both;Seated Elbow Extension: 10 reps;Both;Seated Wrist Flexion: AROM;10 reps;Both Wrist Extension: AROM;10 reps;Seated Hand Exercises Digit Composite Flexion: AROM;Both;10 reps;Seated Composite Extension: 10 reps;AROM;Seated   Shoulder Instructions       General Comments husband present throughout, pt worked with OT earlier and reports increased fatigue sitting up in chair, Pt with increased edema in all 4 extremities    Pertinent Vitals/ Pain       Pain Assessment: Faces Faces Pain Scale: Hurts even more Pain Location: generalized, back Pain Descriptors / Indicators: Discomfort;Grimacing Pain Intervention(s): Limited activity within patient's tolerance;Monitored during session;Repositioned  Home Living                                          Prior Functioning/Environment              Frequency  Min 3X/week        Progress Toward Goals  OT Goals(current goals can now be found in the care plan section)  Progress towards OT goals: Progressing toward goals  Acute Rehab OT Goals Patient Stated Goal: to take her home OT Goal Formulation: With family Time For Goal Achievement: 02/08/21 Potential to Achieve Goals: Good ADL Goals Pt Will Perform Grooming: with min assist;sitting;bed level Pt Will Transfer to Toilet: with min assist;stand pivot  transfer;bedside commode Pt Will Perform Toileting - Clothing Manipulation and hygiene: with min assist;sitting/lateral leans;sit to/from stand Additional ADL Goal #1: Pt will perform bed mobility with Min A in preparation for ADLs Additional ADL Goal #2: Pt will follow one step commands during ADLs with Min cues  Plan Discharge plan remains appropriate    Co-evaluation                 AM-PAC OT "6 Clicks" Daily Activity     Outcome Measure   Help from another person eating meals?: A Lot Help from another person taking care of personal grooming?: A Lot Help from another person toileting, which includes using toliet, bedpan, or urinal?: A Lot Help from another person bathing (including washing, rinsing, drying)?: A Lot Help from another person to put on and taking off regular upper body clothing?: A Little Help from another person to put on and taking off regular lower body clothing?: A Lot 6 Click Score: 13    End of Session Equipment Utilized During Treatment: Gait belt  OT Visit Diagnosis: Unsteadiness on feet (R26.81);Other abnormalities of gait and mobility (R26.89);Muscle weakness (generalized) (M62.81)   Activity Tolerance Patient tolerated treatment well   Patient Left in chair;with family/visitor present;with call bell/phone within reach   Nurse Communication Mobility status        Time: 1610-9604 OT Time  Calculation (min): 31 min  Charges: OT General Charges $OT Visit: 1 Visit OT Treatments $Self Care/Home Management : 8-22 mins $Therapeutic Activity: 8-22 mins  Ladene Artist, OTDS    Ladene Artist 02/04/2021, 3:12 PM

## 2021-02-04 NOTE — Progress Notes (Signed)
Physical Therapy Treatment Patient Details Name: Ariana Wilkinson MRN: [id number] DOB: [birth year] Today's Date: 02/04/2021    History of Present Illness 70 yo female presenting to [facility] health ED on 6/15 with confusion and AKI. PMH including critical blindness, type 1 diabetes on insulin pump, s/p lumbar fusion surgery at L5-S1 on 12/26/2020 by Dr. [name], readmitted for post infection/abscess with washout surgery on 01/11/2021, infection site growing Pseudomonas sensitive to cefepime, discharged on 01/16/2021 to complete 4 weeks of IV antibiotics.    PT Comments    Pt requesting to get back to bed after working with OT earilier. Pt noted to have increased edema in all four extremities. Pt educated on need for ROM especially of ankles to help improve edema. Pt require min A for transfers and modA for returning LE back into the bed. Once in bed worked on AROM of LE. Husband present throughout session and reports he will work with her on ROM. Will continue to work with pt to improve mobility to improve safety with d/c home.     Follow Up Recommendations  Home health PT;Supervision/Assistance - 24 hour     Equipment Recommendations  Wheelchair (measurements PT);Wheelchair cushion (measurements PT)       Precautions / Restrictions Precautions Precautions: Fall;Back Precaution Booklet Issued: No Precaution Comments: no specific order for back precautions    Mobility  Bed Mobility Overal bed mobility: Needs Assistance Bed Mobility: Sit to Sidelying;Rolling         Sit to sidelying: Mod assist General bed mobility comments: modA for management of LE back into bed    Transfers Overall transfer level: Needs assistance Equipment used: 1 person hand held assist Transfers: Sit to/from [employer] Sit to Stand: Min assist Stand pivot transfers: Min assist       General transfer comment: min A for stand and pivot from straight back chair to bed. Pt found to have had  small BM and required min A for sit>stand for pericare  Ambulation/Gait             General Gait Details: declined today         Balance Overall balance assessment: Needs assistance Sitting-balance support: No upper extremity supported;Feet supported Sitting balance-Leahy Scale: Fair     Standing balance support: Bilateral upper extremity supported Standing balance-Leahy Scale: Poor                              Cognition Arousal/Alertness: Awake/alert Behavior During Therapy: WFL for tasks assessed/performed Overall Cognitive Status: Impaired/Different from baseline Area of Impairment: Memory;Following commands;Awareness;Problem solving;Attention                   Current Attention Level: Sustained Memory: Decreased short-term memory;Decreased recall of precautions Following Commands: Follows one step commands with increased time   Awareness: Emergent Problem Solving: Slow processing;Requires verbal cues;Requires tactile cues General Comments: Pt continues to require increased time for command follow. Multimodal cues required due to visual impairment.      Exercises General Exercises - Lower Extremity Ankle Circles/Pumps: AROM;Both;10 reps;Supine (2 sets) Heel Slides: AROM;Both;10 reps;Supine Straight Leg Raises: AROM;Both;10 reps;Supine (in comfortable range about 6inches off bed)    General Comments General comments (skin integrity, edema, etc.): husband present throughout, pt worked with OT earlier and reports increased fatigue sitting up in chair, Pt with increased edema in all 4 extremities      Pertinent Vitals/Pain Pain Assessment: Faces Faces Pain Scale: Hurts  even more Pain Location: generalized, back Pain Descriptors / Indicators: Discomfort;Grimacing Pain Intervention(s): Limited activity within patient's tolerance;Monitored during session;Repositioned     PT Goals (current goals can now be found in the care plan section) Acute  Rehab PT Goals PT Goal Formulation: With family Time For Goal Achievement: 02/08/21 Potential to Achieve Goals: Fair Progress towards PT goals: Not progressing toward goals - comment (fatigue from working with OT)    Frequency    Min 3X/week      PT Plan Equipment recommendations need to be updated;Discharge plan needs to be updated       AM-PAC PT "6 Clicks" Mobility   Outcome Measure  Help needed turning from your back to your side while in a flat bed without using bedrails?: A Little Help needed moving from lying on your back to sitting on the side of a flat bed without using bedrails?: A Little Help needed moving to and from a bed to a chair (including a wheelchair)?: A Little Help needed standing up from a chair using your arms (e.g., wheelchair or bedside chair)?: A Little Help needed to walk in hospital room?: A Lot Help needed climbing 3-5 steps with a railing? : Total 6 Click Score: 15    End of Session Equipment Utilized During Treatment: Gait belt Activity Tolerance: Patient limited by lethargy Patient left: with call bell/phone within reach;with family/visitor present;in chair Nurse Communication: Mobility status PT Visit Diagnosis: Unsteadiness on feet (R26.81);Other abnormalities of gait and mobility (R26.89)     Time: 9741-6384 PT Time Calculation (min) (ACUTE ONLY): 14 min  Charges:  $Therapeutic Exercise: 8-22 mins                     Ariana Wilkinson PT, DPT Acute Rehabilitation Services Pager [phone] Office [phone]    Elon Alas Fleet 02/04/2021, 3:00 PM

## 2021-02-04 NOTE — Progress Notes (Signed)
Ok to increase synthroid to 133mcg/day per Dr. Rito Ehrlich.  Ulyses Southward, PharmD, BCIDP, AAHIVP, CPP Infectious Disease Pharmacist 02/04/2021 2:04 PM

## 2021-02-05 ENCOUNTER — Inpatient Hospital Stay (HOSPITAL_COMMUNITY): Payer: Medicare Other

## 2021-02-05 LAB — CBC WITH DIFFERENTIAL/PLATELET
Abs Immature Granulocytes: 0.35 10*3/uL — ABNORMAL HIGH (ref 0.00–0.07)
Basophils Absolute: 0.1 10*3/uL (ref 0.0–0.1)
Basophils Relative: 1 %
Eosinophils Absolute: 0.4 10*3/uL (ref 0.0–0.5)
Eosinophils Relative: 4 %
HCT: 26.4 % — ABNORMAL LOW (ref 36.0–46.0)
Hemoglobin: 8.3 g/dL — ABNORMAL LOW (ref 12.0–15.0)
Immature Granulocytes: 4 %
Lymphocytes Relative: 17 %
Lymphs Abs: 1.8 10*3/uL (ref 0.7–4.0)
MCH: 31.4 pg (ref 26.0–34.0)
MCHC: 31.4 g/dL (ref 30.0–36.0)
MCV: 100 fL (ref 80.0–100.0)
Monocytes Absolute: 1.1 10*3/uL — ABNORMAL HIGH (ref 0.1–1.0)
Monocytes Relative: 11 %
Neutro Abs: 6.3 10*3/uL (ref 1.7–7.7)
Neutrophils Relative %: 63 %
Platelets: 193 10*3/uL (ref 150–400)
RBC: 2.64 MIL/uL — ABNORMAL LOW (ref 3.87–5.11)
RDW: 15.6 % — ABNORMAL HIGH (ref 11.5–15.5)
WBC: 10.1 10*3/uL (ref 4.0–10.5)
nRBC: 0 % (ref 0.0–0.2)

## 2021-02-05 LAB — GLUCOSE, CAPILLARY
Glucose-Capillary: 86 mg/dL (ref 70–99)
Glucose-Capillary: 165 mg/dL — ABNORMAL HIGH (ref 70–99)

## 2021-02-05 LAB — IRON AND TIBC
Iron: 40 ug/dL (ref 28–170)
Saturation Ratios: 22 % (ref 10.4–31.8)
TIBC: 183 ug/dL — ABNORMAL LOW (ref 250–450)
UIBC: 143 ug/dL

## 2021-02-05 LAB — PHOSPHORUS: Phosphorus: 4.2 mg/dL (ref 2.5–4.6)

## 2021-02-05 LAB — RENAL FUNCTION PANEL
Albumin: 1.6 g/dL — ABNORMAL LOW (ref 3.5–5.0)
Anion gap: 10 (ref 5–15)
BUN: 54 mg/dL — ABNORMAL HIGH (ref 8–23)
CO2: 21 mmol/L — ABNORMAL LOW (ref 22–32)
Calcium: 7.8 mg/dL — ABNORMAL LOW (ref 8.9–10.3)
Chloride: 102 mmol/L (ref 98–111)
Creatinine, Ser: 4.59 mg/dL — ABNORMAL HIGH (ref 0.44–1.00)
GFR, Estimated: 10 mL/min — ABNORMAL LOW (ref >60)
Glucose, Bld: 111 mg/dL — ABNORMAL HIGH (ref 70–99)
Phosphorus: 4.2 mg/dL (ref 2.5–4.6)
Potassium: 3.7 mmol/L (ref 3.5–5.1)
Sodium: 133 mmol/L — ABNORMAL LOW (ref 135–145)

## 2021-02-05 LAB — FERRITIN: Ferritin: 86 ng/mL (ref 11–307)

## 2021-02-05 LAB — MAGNESIUM: Magnesium: 1.7 mg/dL (ref 1.7–2.4)

## 2021-02-05 MED ORDER — SODIUM CHLORIDE 0.9 % IV SOLN
250.0000 mg | Freq: Every day | INTRAVENOUS | Status: AC
  Administered 2021-02-05 – 2021-02-06 (×2): 250 mg via INTRAVENOUS
  Filled 2021-02-05 (×2): qty 20

## 2021-02-05 MED ORDER — SODIUM CHLORIDE 0.9 % IV SOLN: INTRAVENOUS | Status: DC

## 2021-02-05 MED ORDER — DARBEPOETIN ALFA 60 MCG/0.3ML IJ SOSY
60.0000 ug | PREFILLED_SYRINGE | Freq: Once | INTRAMUSCULAR | Status: AC
  Administered 2021-02-05: 60 ug via SUBCUTANEOUS
  Filled 2021-02-05: qty 0.3

## 2021-02-05 MED ORDER — CYANOCOBALAMIN 1000 MCG/ML IJ SOLN
1000.0000 ug | Freq: Once | INTRAMUSCULAR | Status: AC
  Administered 2021-02-05: 1000 ug via INTRAMUSCULAR
  Filled 2021-02-05: qty 1

## 2021-02-05 NOTE — Progress Notes (Signed)
Physical Therapy Treatment Patient Details Name: Ariana Wilkinson MRN: [id number] DOB: 28-Jan-1951 Today's Date: 02/05/2021    History of Present Illness 70 yo female presenting to [facility] health ED on 6/15 with confusion and AKI. PMH including critical blindness, type 1 diabetes on insulin pump, s/p lumbar fusion surgery at L5-S1 on 12/26/2020 by Dr. [name], readmitted for post infection/abscess with washout surgery on 01/11/2021, infection site growing Pseudomonas sensitive to cefepime, discharged on 01/16/2021 to complete 4 weeks of IV antibiotics.    PT Comments    PT goal to walk with pt in hallway, however when entered room pt with bed elevated only about 18 degrees, on 3L O2 via [place] although SaO2 93%O2 pt with increased work of breathing. Pt husband reports pt had chest X-ray as MD was concerned about fluid in lungs. Educated pt on need to at least elevate HoB to improve lung positioning. Pt reports she feels like that makes breathing even harder. Elevated HoB to 30 degrees and instructed pt in deep breathing. Provided pt incentive spirometer and educated on proper usage. Pt able max inhalation . Educated pt and husband on need to try to utilize IS at least 10x/hr. Pt voices understanding. PT will follow back for gait progression.      Follow Up Recommendations  Home health PT;Supervision/Assistance - 24 hour     Equipment Recommendations  Wheelchair (measurements PT);Wheelchair cushion (measurements PT)       Precautions / Restrictions Precautions Precautions: Fall;Back Precaution Booklet Issued: No Precaution Comments: no specific order for back precautions Restrictions Weight Bearing Restrictions: No    Mobility  Bed Mobility               General bed mobility comments: declined             Balance Overall balance assessment: Needs assistance Sitting-balance support: No upper extremity supported;Feet supported Sitting balance-Leahy Scale: Fair     Standing  balance support: Bilateral upper extremity supported Standing balance-Leahy Scale: Poor                              Cognition Arousal/Alertness: Awake/alert Behavior During Therapy: WFL for tasks assessed/performed Overall Cognitive Status: Impaired/Different from baseline Area of Impairment: Memory;Following commands;Awareness;Problem solving;Attention                   Current Attention Level: Sustained Memory: Decreased short-term memory;Decreased recall of precautions Following Commands: Follows one step commands with increased time   Awareness: Emergent Problem Solving: Slow processing;Requires verbal cues;Requires tactile cues General Comments: pt with poor understanding of her deficits      Exercises General Exercises - Lower Extremity Ankle Circles/Pumps: AROM;Both;10 reps;Supine (2 sets) Other Exercises Other Exercises: IS x 12, max inhale    General Comments General comments (skin integrity, edema, etc.): husband present, Pt on 3 LO2 via [place] with SaO2 93%O2, however pt with increased work of breathing      Pertinent Vitals/Pain Pain Assessment: Faces Faces Pain Scale: Hurts even more Pain Location: generalized, back Pain Descriptors / Indicators: Discomfort;Grimacing Pain Intervention(s): Limited activity within patient's tolerance;Monitored during session;Repositioned     PT Goals (current goals can now be found in the care plan section) Acute Rehab PT Goals Patient Stated Goal: to take her home PT Goal Formulation: With family Time For Goal Achievement: 02/08/21 Potential to Achieve Goals: Fair Progress towards PT goals: Not progressing toward goals - comment    Frequency  Min 3X/week      PT Plan Current plan remains appropriate       AM-PAC PT "6 Clicks" Mobility   Outcome Measure  Help needed turning from your back to your side while in a flat bed without using bedrails?: A Little Help needed moving from lying on  your back to sitting on the side of a flat bed without using bedrails?: A Little Help needed moving to and from a bed to a chair (including a wheelchair)?: A Little Help needed standing up from a chair using your arms (e.g., wheelchair or bedside chair)?: A Little Help needed to walk in hospital room?: A Lot Help needed climbing 3-5 steps with a railing? : Total 6 Click Score: 15    End of Session Equipment Utilized During Treatment: Gait belt Activity Tolerance: Patient limited by lethargy Patient left: with call bell/phone within reach;with family/visitor present;in chair Nurse Communication: Mobility status PT Visit Diagnosis: Unsteadiness on feet (R26.81);Other abnormalities of gait and mobility (R26.89)     Time: 7169-6789 PT Time Calculation (min) (ACUTE ONLY): 20 min  Charges:  $Therapeutic Exercise: 8-22 mins                     Ariana Wilkinson PT, DPT Acute Rehabilitation Services Pager [phone] Office [phone]    Ariana Wilkinson Fleet 02/05/2021, 4:21 PM

## 2021-02-05 NOTE — Care Management Important Message (Signed)
Important Message  Patient Details  Name: Ariana Wilkinson MRN: [id number] Date of Birth: 08-Nov-1950   Medicare Important Message Given:  Yes - Important Message mailed due to current National Emergency  Verbal consent obtained due to current National Emergency  Relationship to patient: Self Contact Name: [place] Call Date: 02/05/21  Time: 1311 Phone: [phone] Outcome: No Answer/Busy Important Message mailed to: Patient address on file     Orson Aloe 02/05/2021, 1:12 PM

## 2021-02-05 NOTE — Progress Notes (Addendum)
PROGRESS NOTE  Ariana Wilkinson ZOX:[id number] DOB: 26-Mar-1951 DOA: 01/24/2021 PCP: Barbette Reichmann, MD  HPI/Recap of past 32 hours: 70 year old female with past medical history of diabetes mellitus with insulin pump with peripheral neuropathy and hypertension who underwent a lumbar fusion surgery on 5/18 readmitted on 6/3 for postinfection abscess with infection site growing out Pseudomonas.  Patient started on IV cefepime and discharged on 6/8, but then presented to the emergency room on 6/15 at [facility] with creatinine of 4.2 and CT of abdomen noting post renal obstruction from distended bladder.  Given lack of nephrology services at [facility], patient transferred to [facility] to [facility] on 6/16.  Nephrology consulted.  Patient's creatinine peaked at 5.05 on 6/18 and since then has been slowly trending downward.  Patient's acute kidney injury felt to be multifactorial from urinary retention from obstruction as well as poor p.o. intake plus large urinary tract infection.  Has been responding to bicarb and fluids plus antibiotics with creatinine slowly improving.  Lasix initiated on 6/22.  To date has diuresed over 10 L of fluid and creatinine have leveled off around 4 until patient had blood loss with creatinine worsened to 4.4, but following blood, down to 4.3 but today it up to 4.6  Patient continues to have little appetite, still quite nauseated.  Patient's hemoglobin dropped on 6/26 from 8.3 down to 6.9.  On subcu heparin for DVT prophylaxis which was discontinued and changed to SCDs.  Patient received 1 unit packed red blood cells.  Hemoglobin improved to 9.4, but then dropped to 8.3 today.  Patient herself today overall feels better than she has in a while.  Less swelling.  Does get short of breath very easily.  When removing her sutures and she had to lay on her side, she did become somewhat short of breath.  Assessment/Plan: Principal Problem:   AKI (acute kidney injury)  [facility]): Appreciate nephrology help.  Continue fluids and cefepime.  Creatinine has started to stabilize around 4, then had worsening, possibly due to blood loss.  No further blood loss but she had worsening creatinine today.  I am worried that she is overall declining.  Have tried stopping Lasix and giving some gentle fluids and albumin.  Very little fluids given shortness of breath easily and chest x-ray noting small effusion.  Will resume diuretics tonight. Active Problems:   Acute urinary retention   Metabolic acidosis, normal anion gap (NAG)    Acute metabolic encephalopathy: Likely uremia.  Seems to have much improved as her renal function improves also.  Hopefully renal function will continue to improve  Hypothyroidism: On Synthroid, changed back to p.o.    Overweight (BMI 25.0-29.9): Patient meets criteria BMI greater than 25.    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  Noting that patient's A1c is rather low at 4.8.  Adjusted sliding scale to 3 times daily plus at bedtime and slightly decreased Levemir.  Appreciate diabetic educator help    Essential hypertension: Blood pressures ranging from 130s to 150s.  Had some episodes of hypotension, likely in part due to getting all of her meds IV.  Will change back to p.o. which should help space this out.  Suspected protein-calorie malnutrition [facility]): Nutrition consulted unable to assess due to volume overload  Nausea/vomiting: Secondary to uremia.  Abdominal x-ray notes no evidence of impaction or ileus.  Adding scheduled Zofran seems to have helped.  Urinary tract infection: Urine cultures with no growth although  patient had been on IV antibiotics from previous hospitalization.   Suspected acute blood loss anemia: In part due to chronic disease, some macrocytosis but then noted acute drop this morning with slight rise in creatinine.  Discontinue subcu heparin and started SCDs.  Patient status  post 1 unit of blood.  Recheck labs in the morning.  Surgical site infection/leukocytosis: Noted upward trend.  Procalcitonin level overall trending downward.  Recheck in the morning.  Due for suture removal today in the office.  With nursing assistance, was able to get patient on her side and evaluate wound healing.  Removed all sutures and provided few Steri-Strips.  It is possible she had sepsis before coming in from [place].  Sepsis however ruled out here.  Code Status: Full code  Family Communication: Husband at the bedside  Disposition Plan: Surprisingly, patient did well with PT and OT who recommended home health.  I am hoping that we can get her home once her renal function stabilizes.  If not, need to look at palliative care   Consultants: Nephrology  Procedures: Removal of sutures by hospitalist on 6/28  Antimicrobials: IV cefepime 6/16-present  DVT prophylaxis: SCDs  Level of care: Telemetry Medical   Objective: Vitals:   02/04/21 2215 02/05/21 0623  BP: (!) 127/42 (!) 125/40  Pulse: 77 78  Resp: 16 16  Temp: 98.8 F (37.1 C) 97.7 F (36.5 C)  SpO2: 98% 96%    Intake/Output Summary (Last 24 hours) at 02/05/2021 1425 Last data filed at 02/05/2021 5697 Gross per 24 hour  Intake 240 ml  Output 2600 ml  Net -2360 ml    Filed Weights   01/30/21 2152 02/01/21 0131 02/04/21 0539  Weight: 69.2 kg 63.7 kg 61.1 kg   Body mass index is 27.21 kg/m.  Exam:  General: Alert and oriented x2, get short of breath with talking HEENT: Normocephalic and atraumatic, mucous membranes are slightly dry Cardiovascular: Regular rate and rhythm, S1-S2 no rubs Respiratory: Clear to auscultation bilaterally, slightly tachypneic, few bibasilar crackles Abdomen: Soft, obese, nontender, few high-pitched bowel sounds Back: Noted previous surgical wound with sutures.  Following suture removal minimal 2 mm dehiscence treated with Steri-Strips Musculoskeletal: No clubbing or  cyanosis, 1-2+ pitting edema Skin: No skin breaks, tears or lesions Psychiatry: Appropriate, no evidence of psychoses Neurology: Decreased chronic peripheral neuropathy, otherwise no focal deficits   Data Reviewed: CBC: Recent Labs  Lab 02/01/21 0414 02/02/21 0407 02/03/21 0444 02/04/21 0222 02/05/21 0319  WBC 13.0* 14.2* 13.8* 14.0* 10.1  NEUTROABS 9.2* 9.7* 8.9* 9.4* 6.3  HGB 8.1* 8.3* 6.9* 9.4* 8.3*  HCT 25.3* 26.1* 22.3* 28.6* 26.4*  MCV 99.2 100.0 100.9* 95.7 100.0  PLT 198 187 145* 164 193    Basic Metabolic Panel: Recent Labs  Lab 02/01/21 0414 02/02/21 0407 02/03/21 0444 02/03/21 2356 02/04/21 0222 02/05/21 0319  NA 132* 129* 131* 131* 131* 133*  K 3.4* 3.6 3.4* 3.8 3.7 3.7  CL 101 98 101 101 100 102  CO2 21* 20* 22 21* 21* 21*  GLUCOSE 143* 63* 61* 201* 137* 111*  BUN 53* 52* 54* 53* 54* 54*  CREATININE 4.07* 4.05* 4.14* 4.42* 4.37* 4.59*  CALCIUM 8.7* 8.4* 8.0* 7.9* 7.9* 7.8*  MG 1.8 1.7 1.6*  --  1.6* 1.7  PHOS 3.9  4.1 3.5  3.6 3.5  --  3.7 4.2  4.2    GFR: Estimated Creatinine Clearance: 9.2 mL/min (A) (by C-G formula based on SCr of 4.59 mg/dL (H)). Liver Function Tests:  Recent Labs  Lab 01/30/21 0400 02/01/21 0414 02/02/21 0407 02/03/21 0444 02/04/21 0222 02/05/21 0319  AST 12*  --   --   --   --   --   ALT 8  --   --   --   --   --   ALKPHOS 64  --   --   --   --   --   BILITOT 0.4  --   --   --   --   --   PROT 4.6*  --   --   --   --   --   ALBUMIN 1.4* 1.6* 1.6* 1.4* 1.5* 1.6*    No results for input(s): LIPASE, AMYLASE in the last 168 hours. No results for input(s): AMMONIA in the last 168 hours.  Coagulation Profile: No results for input(s): INR, PROTIME in the last 168 hours. Cardiac Enzymes: No results for input(s): CKTOTAL, CKMB, CKMBINDEX, TROPONINI in the last 168 hours. BNP (last 3 results) No results for input(s): PROBNP in the last 8760 hours. HbA1C: No results for input(s): HGBA1C in the last 72  hours. CBG: Recent Labs  Lab 02/04/21 1156 02/04/21 1637 02/04/21 2217 02/05/21 0636 02/05/21 1125  GLUCAP 103* 245* 205* 86 165*    Lipid Profile: No results for input(s): CHOL, HDL, LDLCALC, TRIG, CHOLHDL, LDLDIRECT in the last 72 hours. Thyroid Function Tests: No results for input(s): TSH, T4TOTAL, FREET4, T3FREE, THYROIDAB in the last 72 hours. Anemia Panel: Recent Labs    02/05/21 0319  FERRITIN 86  TIBC 183*  IRON 40    Urine analysis:    Component Value Date/Time   COLORURINE STRAW (A) 01/31/2021 1622   APPEARANCEUR CLEAR 01/31/2021 1622   LABSPEC 1.008 01/31/2021 1622   PHURINE 5.0 01/31/2021 1622   GLUCOSEU NEGATIVE 01/31/2021 1622   HGBUR MODERATE (A) 01/31/2021 1622   BILIRUBINUR NEGATIVE 01/31/2021 1622   KETONESUR NEGATIVE 01/31/2021 1622   PROTEINUR 30 (A) 01/31/2021 1622   NITRITE NEGATIVE 01/31/2021 1622   LEUKOCYTESUR NEGATIVE 01/31/2021 1622   Sepsis Labs: @LABRCNTIP (procalcitonin:4,lacticidven:4)  ) No results found for this or any previous visit (from the past 240 hour(s)).     Studies: DG CHEST PORT 1 VIEW  Result Date: 02/05/2021 CLINICAL DATA:  Shortness of breath. EXAM: PORTABLE CHEST 1 VIEW COMPARISON:  February 03, 2021. FINDINGS: Stable cardiomediastinal silhouette. Left-sided PICC line is unchanged in position. Bilateral perihilar and basilar opacities are noted concerning for edema or atelectasis. Small bilateral pleural effusions are noted. No pneumothorax is noted. Bony thorax is unremarkable. IMPRESSION: Bilateral perihilar and basilar opacities are noted concerning for edema or atelectasis. Small pleural effusions. Electronically Signed   By: February 05, 2021 M.D.   On: 02/05/2021 13:24    Scheduled Meds:  amLODipine  5 mg Oral Daily   carvedilol  25 mg Oral BID   Chlorhexidine Gluconate Cloth  6 each Topical Daily   darbepoetin (ARANESP) injection - NON-DIALYSIS  60 mcg Subcutaneous Once   insulin aspart  0-15 Units Subcutaneous  TID WC   insulin aspart  0-5 Units Subcutaneous QHS   insulin detemir  8 Units Subcutaneous QHS   levothyroxine  125 mcg Oral Q0600   ondansetron (ZOFRAN) IV  4 mg Intravenous Q6H   pantoprazole  40 mg Oral Daily    Continuous Infusions:  sodium chloride 50 mL/hr at 02/05/21 0829   ceFEPime (MAXIPIME) IV 1 g (02/05/21 0931)   ferric gluconate (FERRLECIT) IVPB 250 mg (02/05/21 1222)  promethazine (PHENERGAN) injection (IM or IVPB) 6.25 mg (02/03/21 1653)     LOS: 12 days     Hollice Espy, MD [facility]   02/05/2021, 2:25 PM

## 2021-02-05 NOTE — Progress Notes (Addendum)
[facility] NEPHROLOGY PROGRESS NOTE  Assessment/ Plan: Pt is a 70 y.o. yo female with history of DM status post lumbar fusion surgery in 12/2020, readmitted for infection and had a washout on 6/3, presented to [facility] on 6/15 because of AKI with creatinine level of 5.05.  #Acute kidney injury on CKD with baseline creatinine level around 1.2-1.4 with recent back surgery, infection and hospitalization.  UA with minimal proteinuria, no M spike.  Kidney ultrasound with chronic findings without hydronephrosis.  Patient has been getting IV Lasix with decent urine output however without much improvement in creatinine level.  I noted the Lasix was discontinued and started gentle IV hydration by primary team.  I am little concerned about volume overload.  I will order chest x-ray to look for any pulmonary congestion or pleural effusion.  In that case, I recommend to discontinue IV fluid and to resume IV Lasix.  Denies any uremic symptoms.  The patient and her husband do not want dialysis.  Recommend palliative care consult to address goals of care. She is blind and hard of hearing. Discussed with Dr. Rito Ehrlich.  #Hypertension/volume: Continue current antihypertensive.  Most likely we need to resume IV diuretics.  #Hyponatremia, hypervolemic: Managed with IV loop diuretics.  Monitor lab.  #Anemia due to chronic disease and recent surgery/hospitalization: Received PRBC.  Hemoglobin is stable today.  Iron saturation 22%, I will order iron and ESA.  #Hypoalbuminemia: Receiving albumin.  Hopefully this will help augment diuretics effect.  #Acute hypoxic respiratory failure: Requiring oxygen.  Checking x-ray.  I recommend to check echocardiogram.  Subjective: Seen and examined at bedside.  Urine output around 2.6 L with IV diuretics.  Creatinine level not improving.  She reports some shortness of breath.  Noted diuretics was discontinued by the primary team.  She denies nausea,  vomiting, chest pain, headache or dizziness.  Objective Vital signs in last 24 hours: Vitals:   02/04/21 1002 02/04/21 1642 02/04/21 2215 02/05/21 0623  BP: (!) 131/53 (!) 134/46 (!) 127/42 (!) 125/40  Pulse: 75 77 77 78  Resp: 16 16 16 16   Temp: 98 F (36.7 C) 97.7 F (36.5 C) 98.8 F (37.1 C) 97.7 F (36.5 C)  TempSrc: Oral Oral Oral Oral  SpO2: 95% 97% 98% 96%  Weight:      Height:       Weight change:   Intake/Output Summary (Last 24 hours) at 02/05/2021 1127 Last data filed at 02/05/2021 02/07/2021 Gross per 24 hour  Intake 480 ml  Output 2600 ml  Net -2120 ml        Labs: Basic Metabolic Panel: Recent Labs  Lab 02/03/21 0444 02/03/21 2356 02/04/21 0222 02/05/21 0319  NA 131* 131* 131* 133*  K 3.4* 3.8 3.7 3.7  CL 101 101 100 102  CO2 22 21* 21* 21*  GLUCOSE 61* 201* 137* 111*  BUN 54* 53* 54* 54*  CREATININE 4.14* 4.42* 4.37* 4.59*  CALCIUM 8.0* 7.9* 7.9* 7.8*  PHOS 3.5  --  3.7 4.2  4.2    Liver Function Tests: Recent Labs  Lab 01/30/21 0400 02/01/21 0414 02/03/21 0444 02/04/21 0222 02/05/21 0319  AST 12*  --   --   --   --   ALT 8  --   --   --   --   ALKPHOS 64  --   --   --   --   BILITOT 0.4  --   --   --   --  PROT 4.6*  --   --   --   --   ALBUMIN 1.4*   < > 1.4* 1.5* 1.6*   < > = values in this interval not displayed.    No results for input(s): LIPASE, AMYLASE in the last 168 hours. No results for input(s): AMMONIA in the last 168 hours. CBC: Recent Labs  Lab 02/01/21 0414 02/02/21 0407 02/03/21 0444 02/04/21 0222 02/05/21 0319  WBC 13.0* 14.2* 13.8* 14.0* 10.1  NEUTROABS 9.2* 9.7* 8.9* 9.4* 6.3  HGB 8.1* 8.3* 6.9* 9.4* 8.3*  HCT 25.3* 26.1* 22.3* 28.6* 26.4*  MCV 99.2 100.0 100.9* 95.7 100.0  PLT 198 187 145* 164 193    Cardiac Enzymes: No results for input(s): CKTOTAL, CKMB, CKMBINDEX, TROPONINI in the last 168 hours. CBG: Recent Labs  Lab 02/04/21 1156 02/04/21 1637 02/04/21 2217 02/05/21 0636 02/05/21 1125   GLUCAP 103* 245* 205* 86 165*     Iron Studies:  Recent Labs    02/05/21 0319  IRON 40  TIBC 183*  FERRITIN 86   Studies/Results: DG Chest Port 1 View  Result Date: 02/03/2021 CLINICAL DATA:  Acute respiratory distress EXAM: PORTABLE CHEST 1 VIEW COMPARISON:  02/01/2021 FINDINGS: Left PICC line remains in place with the tip in the upper right atrium. Small left pleural effusion with left lower lobe infiltrate, both improving since prior study. Small right pleural effusion. No confluent opacity on the right. Heart is normal size. IMPRESSION: Small left pleural effusion with left lower lobe infiltrate, both improved slightly since prior study. Small right pleural effusion. Electronically Signed   By: Charlett Nose M.D.   On: 02/03/2021 20:39    Medications: Infusions:  sodium chloride 50 mL/hr at 02/05/21 0829   ceFEPime (MAXIPIME) IV 1 g (02/05/21 0931)   promethazine (PHENERGAN) injection (IM or IVPB) 6.25 mg (02/03/21 1653)    Scheduled Medications:  amLODipine  5 mg Oral Daily   carvedilol  25 mg Oral BID   Chlorhexidine Gluconate Cloth  6 each Topical Daily   insulin aspart  0-15 Units Subcutaneous TID WC   insulin aspart  0-5 Units Subcutaneous QHS   insulin detemir  8 Units Subcutaneous QHS   levothyroxine  125 mcg Oral Q0600   ondansetron (ZOFRAN) IV  4 mg Intravenous Q6H   pantoprazole  40 mg Oral Daily    have reviewed scheduled and prn medications.  Physical Exam: General:NAD, comfortable, lying on bed Heart:RRR, s1s2 nl, no rubs Lungs: Bibasilar crackles, no wheezing Abdomen:soft, Non-tender, non-distended Extremities: Bilateral upper and lower extremities has pitting edema present Neurology: Alert, awake, following commands  Shaila Gilchrest Jaynie Collins 02/05/2021,11:27 AM  LOS: 12 days

## 2021-02-06 ENCOUNTER — Inpatient Hospital Stay (HOSPITAL_COMMUNITY): Payer: Medicare Other

## 2021-02-06 DIAGNOSIS — Z5329 Procedure and treatment not carried out because of patient's decision for other reasons: Secondary | ICD-10-CM

## 2021-02-06 DIAGNOSIS — R0603 Acute respiratory distress: Secondary | ICD-10-CM

## 2021-02-06 LAB — RENAL FUNCTION PANEL
Albumin: 1.7 g/dL — ABNORMAL LOW (ref 3.5–5.0)
Anion gap: 7 (ref 5–15)
BUN: 55 mg/dL — ABNORMAL HIGH (ref 8–23)
CO2: 21 mmol/L — ABNORMAL LOW (ref 22–32)
Calcium: 7.6 mg/dL — ABNORMAL LOW (ref 8.9–10.3)
Chloride: 104 mmol/L (ref 98–111)
Creatinine, Ser: 4.85 mg/dL — ABNORMAL HIGH (ref 0.44–1.00)
GFR, Estimated: 9 mL/min — ABNORMAL LOW (ref >60)
Glucose, Bld: 107 mg/dL — ABNORMAL HIGH (ref 70–99)
Phosphorus: 4.4 mg/dL (ref 2.5–4.6)
Potassium: 3.6 mmol/L (ref 3.5–5.1)
Sodium: 132 mmol/L — ABNORMAL LOW (ref 135–145)

## 2021-02-06 LAB — MAGNESIUM: Magnesium: 1.7 mg/dL (ref 1.7–2.4)

## 2021-02-06 LAB — CBC WITH DIFFERENTIAL/PLATELET
Abs Immature Granulocytes: 0.31 10*3/uL — ABNORMAL HIGH (ref 0.00–0.07)
Basophils Absolute: 0.1 10*3/uL (ref 0.0–0.1)
Basophils Relative: 1 %
Eosinophils Absolute: 0.4 10*3/uL (ref 0.0–0.5)
Eosinophils Relative: 4 %
HCT: 25.3 % — ABNORMAL LOW (ref 36.0–46.0)
Hemoglobin: 7.8 g/dL — ABNORMAL LOW (ref 12.0–15.0)
Immature Granulocytes: 3 %
Lymphocytes Relative: 13 %
Lymphs Abs: 1.2 10*3/uL (ref 0.7–4.0)
MCH: 30.7 pg (ref 26.0–34.0)
MCHC: 30.8 g/dL (ref 30.0–36.0)
MCV: 99.6 fL (ref 80.0–100.0)
Monocytes Absolute: 1.1 10*3/uL — ABNORMAL HIGH (ref 0.1–1.0)
Monocytes Relative: 11 %
Neutro Abs: 6.6 10*3/uL (ref 1.7–7.7)
Neutrophils Relative %: 68 %
Platelets: 229 10*3/uL (ref 150–400)
RBC: 2.54 MIL/uL — ABNORMAL LOW (ref 3.87–5.11)
RDW: 15.5 % (ref 11.5–15.5)
WBC: 9.7 10*3/uL (ref 4.0–10.5)
nRBC: 0.2 % (ref 0.0–0.2)

## 2021-02-06 LAB — ECHOCARDIOGRAM COMPLETE
AR max vel: 1.66 cm2
AV Area VTI: 1.61 cm2
AV Area mean vel: 1.6 cm2
AV Mean grad: 7 mmHg
AV Peak grad: 11.4 mmHg
Ao pk vel: 1.69 m/s
Area-P 1/2: 5.16 cm2
Height: 59 in
S' Lateral: 2.2 cm
Weight: 2370.39 oz

## 2021-02-06 LAB — GLUCOSE, CAPILLARY
Glucose-Capillary: 201 mg/dL — ABNORMAL HIGH (ref 70–99)
Glucose-Capillary: 207 mg/dL — ABNORMAL HIGH (ref 70–99)
Glucose-Capillary: 84 mg/dL (ref 70–99)
Glucose-Capillary: 195 mg/dL — ABNORMAL HIGH (ref 70–99)
Glucose-Capillary: 248 mg/dL — ABNORMAL HIGH (ref 70–99)
Glucose-Capillary: 202 mg/dL — ABNORMAL HIGH (ref 70–99)

## 2021-02-06 MED ORDER — FUROSEMIDE 10 MG/ML IJ SOLN
80.0000 mg | Freq: Once | INTRAMUSCULAR | Status: AC
  Administered 2021-02-06: 80 mg via INTRAVENOUS
  Filled 2021-02-06: qty 8

## 2021-02-06 MED ORDER — ALBUMIN HUMAN 25 % IV SOLN
50.0000 g | Freq: Four times a day (QID) | INTRAVENOUS | Status: AC
  Administered 2021-02-06 (×2): 50 g via INTRAVENOUS
  Filled 2021-02-06 (×2): qty 200

## 2021-02-06 NOTE — Plan of Care (Signed)
  Problem: Clinical Measurements: Goal: Ability to maintain clinical measurements within normal limits will improve Outcome: Progressing   

## 2021-02-06 NOTE — Progress Notes (Signed)
  Echocardiogram 2D Echocardiogram has been performed.  Pieter Partridge 02/06/2021, 3:19 PM

## 2021-02-06 NOTE — Progress Notes (Addendum)
[facility] NEPHROLOGY PROGRESS NOTE  Assessment/ Plan: Pt is a 70 y.o. yo female with history of DM status post lumbar fusion surgery in 12/2020, readmitted for infection and had a washout on 6/3, presented to [facility] on 6/15 because of AKI with creatinine level of 5.05.  #Acute kidney injury on CKD with baseline creatinine level around 1.2-1.4 with recent back surgery, infection and hospitalization.  UA with minimal proteinuria, no M spike.  Kidney ultrasound with chronic findings without hydronephrosis.  She initially received IV Lasix with good response.  It was held yesterday and noticed that the patient already received IV Lasix today.  I will order IV albumin to help with intravascular volume.  The creatinine level worsening today however she is nonoliguric and clinically doing well.  Denies any uremic symptoms. The patient and her husband do not want dialysis.  I will consult palliative care to address goals of care. She is blind and hard of hearing.  #Hypertension/volume: Continue current antihypertensive.  Diuretics on hold today.  #Hyponatremia, hypervolemic: Managed with IV loop diuretics.  Monitor lab.  #Anemia due to chronic disease and recent surgery/hospitalization: Received PRBC.  Hemoglobin is stable today.  Iron saturation 22%, I will order iron and ESA.  #Hypoalbuminemia: Receiving albumin.  Hopefully this will help augment diuretics effect.  #Acute hypoxic respiratory failure: Requiring oxygen.  X-ray reviewed.  Recommend echocardiogram.  Discussed with the patient's husband.  Subjective: Seen and examined at bedside.  Urine output is around 1.3 L.  She feels good today without any chest pain, shortness of breath, nausea or vomiting.  Objective Vital signs in last 24 hours: Vitals:   02/05/21 1736 02/05/21 2138 02/06/21 0443 02/06/21 0900  BP: (!) 131/42 (!) 144/51 (!) 122/47 (!) 142/48  Pulse: 80 77 69   Resp: 18 16 19 20   Temp:  97.8 F  (36.6 C) 97.6 F (36.4 C) 97.8 F (36.6 C)  TempSrc:  Oral Oral Oral  SpO2: 92% 93% 96% 96%  Weight:  67.2 kg    Height:       Weight change:   Intake/Output Summary (Last 24 hours) at 02/06/2021 1045 Last data filed at 02/06/2021 0600 Gross per 24 hour  Intake 480 ml  Output 1301 ml  Net -821 ml        Labs: Basic Metabolic Panel: Recent Labs  Lab 02/04/21 0222 02/05/21 0319 02/06/21 0409  NA 131* 133* 132*  K 3.7 3.7 3.6  CL 100 102 104  CO2 21* 21* 21*  GLUCOSE 137* 111* 107*  BUN 54* 54* 55*  CREATININE 4.37* 4.59* 4.85*  CALCIUM 7.9* 7.8* 7.6*  PHOS 3.7 4.2  4.2 4.4    Liver Function Tests: Recent Labs  Lab 02/04/21 0222 02/05/21 0319 02/06/21 0409  ALBUMIN 1.5* 1.6* 1.7*    No results for input(s): LIPASE, AMYLASE in the last 168 hours. No results for input(s): AMMONIA in the last 168 hours. CBC: Recent Labs  Lab 02/02/21 0407 02/03/21 0444 02/04/21 0222 02/05/21 0319 02/06/21 0404  WBC 14.2* 13.8* 14.0* 10.1 9.7  NEUTROABS 9.7* 8.9* 9.4* 6.3 6.6  HGB 8.3* 6.9* 9.4* 8.3* 7.8*  HCT 26.1* 22.3* 28.6* 26.4* 25.3*  MCV 100.0 100.9* 95.7 100.0 99.6  PLT 187 145* 164 193 229    Cardiac Enzymes: No results for input(s): CKTOTAL, CKMB, CKMBINDEX, TROPONINI in the last 168 hours. CBG: Recent Labs  Lab 02/05/21 0636 02/05/21 1125 02/05/21 1618 02/05/21 2141 02/06/21 0652  GLUCAP 86 165* 201* 207*  84     Iron Studies:  Recent Labs    02/05/21 0319  IRON 40  TIBC 183*  FERRITIN 86    Studies/Results: DG CHEST PORT 1 VIEW  Result Date: 02/05/2021 CLINICAL DATA:  Shortness of breath. EXAM: PORTABLE CHEST 1 VIEW COMPARISON:  February 03, 2021. FINDINGS: Stable cardiomediastinal silhouette. Left-sided PICC line is unchanged in position. Bilateral perihilar and basilar opacities are noted concerning for edema or atelectasis. Small bilateral pleural effusions are noted. No pneumothorax is noted. Bony thorax is unremarkable. IMPRESSION:  Bilateral perihilar and basilar opacities are noted concerning for edema or atelectasis. Small pleural effusions. Electronically Signed   By: Lupita Raider M.D.   On: 02/05/2021 13:24    Medications: Infusions:  ceFEPime (MAXIPIME) IV 1 g (02/06/21 0906)   ferric gluconate (FERRLECIT) IVPB 250 mg (02/06/21 1026)   promethazine (PHENERGAN) injection (IM or IVPB) 6.25 mg (02/03/21 1653)    Scheduled Medications:  amLODipine  5 mg Oral Daily   carvedilol  25 mg Oral BID   Chlorhexidine Gluconate Cloth  6 each Topical Daily   insulin aspart  0-15 Units Subcutaneous TID WC   insulin aspart  0-5 Units Subcutaneous QHS   insulin detemir  8 Units Subcutaneous QHS   levothyroxine  125 mcg Oral Q0600   ondansetron (ZOFRAN) IV  4 mg Intravenous Q6H   pantoprazole  40 mg Oral Daily    have reviewed scheduled and prn medications.  Physical Exam: General:NAD, comfortable, lying on bed Heart:RRR, s1s2 nl, no rubs Lungs: Clear anteriorly, no wheezing Abdomen:soft, Non-tender, non-distended Extremities: Bilateral upper and lower extremities has pitting edema present Neurology: Alert, awake, following commands  Lavere Stork Prasad Iline Buchinger 02/06/2021,10:45 AM  LOS: 13 days

## 2021-02-06 NOTE — Consult Note (Signed)
Consultation Note Date: 02/06/2021   Patient Name: Ariana Wilkinson  DOB: 05/12/1951  MRN: [id number]  Age / Sex: 70 y.o., female  PCP: Lysbeth Penner, MD Referring Physician: Hosie Poisson, MD  Reason for Consultation: Establishing goals of care  HPI/Patient Profile: 70 y.o. female  with past medical history of DM1 on insulin pump, HTN s/p lumbar fusion surgery on 5/18 with multiple readmissions due to postop wound abscess admitted on 01/24/2021 with acute kidney injury. She is not improving. Now having SOB and pulmonary edema. Nephrology consulted- patient does not want dialysis. Palliative medicine consulted for [place].   Clinical Assessment and Goals of Care: Ariana Wilkinson was awake and alert. She cannot see and is very hard of hearing. No family is at bedside.  We discussed her diagnosis and her goals of care.  She understands that she is likely heading towards end of life.  Her primary goal is to be home and be comfortable.  We discussed her Code Status- she would not want to be resuscitated- but she wants to discuss this with her husband present.  We discussed her symptoms- she is short of breath. I discussed with her typically we use an opioid for SOB - I would recommend low dose liquid oxycodone for her- however, she wishes to avoid opioids for now- she feels her SOB is tolerable.  She requested grape juice at the close of our discussion and she sipped it with great enjoyment.  Primary Decision Maker PATIENT    SUMMARY OF RECOMMENDATIONS -Patient's stated goal is "to go home" -She would be appropriate for referral to Hospice -She probably wishes DNR but wants to discuss with her husband  -Attempted to call her husband and discuss Hospice and code status- however, there was no answer and no opportunity to leave a message- would encourage primary team to discuss this offer Hospice at home if they are able to  reach husband or they meet husband at bedside  Code Status/Advance Care Planning: Full code  Psycho-social/Spiritual:  Desire for further Chaplaincy support:yes  Prognosis:   < 6 months  Discharge Planning: Home with Hospice recommended- unable to reach patient's husband to discuss- encourage primary team to discuss with husband if they are able   Primary Diagnoses: Present on Admission:  AKI (acute kidney injury) ([place])  Acute urinary retention  Metabolic acidosis, normal anion gap (NAG)  Acute metabolic encephalopathy  Overweight (BMI 25.0-29.9)  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 The following aspects are pertinent.  Past Medical History:  Diagnosis Date   Diabetes mellitus without complication (HCC)    Metabolic acidosis, normal anion gap (NAG) 01/25/2021   Social History   Socioeconomic History   Marital status: Married    Spouse name: Not on file   Number of children: Not on file   Years of education: Not on file  Highest education level: Not on file  Occupational History   Not on file  Tobacco Use   Smoking status: Not on file   Smokeless tobacco: Not on file  Substance and Sexual Activity   Alcohol use: Not on file   Drug use: Not on file   Sexual activity: Not on file  Other Topics Concern   Not on file  Social History Narrative   Not on file   Social Determinants of Health   Financial Resource Strain: Not on file  Food Insecurity: Not on file  Transportation Needs: Not on file  Physical Activity: Not on file  Stress: Not on file  Social Connections: Not on file   Scheduled Meds:  amLODipine  5 mg Oral Daily   carvedilol  25 mg Oral BID   Chlorhexidine Gluconate Cloth  6 each Topical Daily   insulin aspart  0-15  Units Subcutaneous TID WC   insulin aspart  0-5 Units Subcutaneous QHS   insulin detemir  8 Units Subcutaneous QHS   levothyroxine  125 mcg Oral Q0600   ondansetron (ZOFRAN) IV  4 mg Intravenous Q6H   pantoprazole  40 mg Oral Daily   Continuous Infusions:  albumin human     ceFEPime (MAXIPIME) IV 1 g (02/06/21 0906)   promethazine (PHENERGAN) injection (IM or IVPB) 6.25 mg (02/03/21 1653)   PRN Meds:.acetaminophen, oxyCODONE, promethazine (PHENERGAN) injection (IM or IVPB), senna-docusate, sodium chloride flush Medications Prior to Admission:  Prior to Admission medications   Medication Sig Start Date End Date Taking? Authorizing Provider  alendronate (FOSAMAX) 70 MG tablet Take 70 mg by mouth once a week.   Yes [provider]  amLODipine (NORVASC) 5 MG tablet Take 5 mg by mouth daily.   Yes [provider]  atorvastatin (LIPITOR) 80 MG tablet Take 80 mg by mouth daily.   Yes [provider]  BAQSIMI ONE PACK 3 MG/DOSE POWD Place 1 spray into the nose See admin instructions. INHALE 1 SPRAY INTO SINGLE NOSTRIL. IF NO RESPONSE, MAY REPEAT IN 15 MINUTES USING A NEW DEVICE. USE AS NEEDED FOR LOW BLOOD SUGAR REACTION. 01/15/21  Yes [provider]  Calcium Carb-Cholecalciferol 600-200 MG-UNIT TABS Take 1 tablet by mouth daily.   Yes [provider]  carvedilol (COREG) 25 MG tablet Take 25 mg by mouth 2 (two) times daily. 11/16/20  Yes [provider]  ceFEPIme (MAXIPIME) 2 g injection Inject 2 g into the muscle every 12 (twelve) hours. Using until July 13th. (6 week treatment) 01/24/21  Yes [provider]  cyanocobalamin (,VITAMIN B-12,) 1000 MCG/ML injection Inject 1 mL into the muscle once a week. 01/11/21  Yes [provider]  diclofenac Sodium (VOLTAREN) 1 % GEL Apply 1 g topically as needed for pain. 09/21/20  Yes [provider]  fexofenadine (ALLEGRA) 180 MG tablet Take 180 mg by mouth as needed for allergies.   Yes  [provider]  furosemide (LASIX) 40 MG tablet Take 40 mg by mouth as needed (swelling).   Yes [provider]  gabapentin (NEURONTIN) 300 MG capsule Take 300 mg by mouth 3 (three) times daily. 10/18/20  Yes [provider]  Glucagon, rDNA, (GLUCAGON EMERGENCY) 1 MG KIT Inject 1 mg into the skin as directed. 01/15/21  Yes [provider]  guanFACINE (TENEX) 1 MG tablet Take 1 mg by mouth at bedtime. 12/06/20  Yes [provider]  insulin lispro (HUMALOG) 100 UNIT/ML injection Inject 40 Units into the skin continuous.  Via pump.   Yes [provider]  labetalol (NORMODYNE) 100 MG tablet Take 100 mg by mouth daily as needed (TAKE ONE TABLET EVERY DAY ONLY AS NEEDED FOR SYSTOLIC BP >491). 7/91/50  Yes [provider]  omeprazole (PRILOSEC) 20 MG capsule Take 20 mg by mouth as needed (acid reflux).   Yes [provider]  ondansetron (ZOFRAN) 4 MG tablet Take 4 mg by mouth every 6 (six) hours as needed for nausea/vomiting. 01/17/21  Yes [provider]  sulfaSALAzine (AZULFIDINE) 500 MG tablet Take 1,000 mg by mouth 2 (two) times daily.   Yes [provider]  SYNTHROID 100 MCG tablet Take 100 mcg by mouth every morning. 12/06/20  Yes [provider]  traMADol (ULTRAM) 50 MG tablet Take 50 mg by mouth as needed for pain. 11/06/20  Yes [provider]   Allergies  Allergen Reactions   Ace Inhibitors Cough   Diazepam Anxiety   Metoclopramide Other (See Comments)    Can not function   Naproxen Swelling   Nitrofurantoin Shortness Of Breath and Other (See Comments)    Hallucination   Olmesartan Other (See Comments)    Hyperkalemia; Ran up her potassium     Alendronate Sodium Other (See Comments)    acid reflux   Erythromycin Base Other (See Comments)   Hydrochlorothiazide Other (See Comments)    Was hurting in her chest and throwing up   Povidone-Iodine Itching     Physical Exam Vitals and  nursing note reviewed.  Skin:    General: Skin is warm and dry.  Neurological:     Mental Status: She is alert and oriented to person, place, and time.     Comments: Hearing and sight impaired    Vital Signs: BP (!) 142/48 (BP Location: Right Arm)   Pulse 69   Temp 97.8 F (36.6 C) (Oral)   Resp 20   Ht $R'4\' 11"'eu$  (1.499 m)   Wt 67.2 kg   SpO2 96%   BMI 29.92 kg/m  Pain Scale: 0-10   Pain Score: 0-No pain   SpO2: SpO2: 96 % O2 Device:SpO2: 96 % O2 Flow Rate: .O2 Flow Rate (L/min): 3 L/min  IO: Intake/output summary:  Intake/Output Summary (Last 24 hours) at 02/06/2021 1237 Last data filed at 02/06/2021 1100 Gross per 24 hour  Intake 300 ml  Output 1951 ml  Net -1651 ml    LBM: Last BM Date: 02/04/21 Baseline Weight: Weight: 54 kg Most recent weight: Weight: 67.2 kg     Palliative Assessment/Data: PPS: 40%     Thank you for this consult. Palliative medicine will continue to follow and assist as needed.   Time In: 1046 Time Out: 1202 Time Total: 76 mins Greater than 50%  of this time was spent counseling and coordinating care related to the above assessment and plan.  Signed by: Mariana Kaufman, AGNP-C Palliative Medicine    Please contact Palliative Medicine Team phone at [phone] for questions and concerns.  For individual provider: See Shea Evans

## 2021-02-06 NOTE — Progress Notes (Signed)
PROGRESS NOTE    Ariana Wilkinson  YQM:[id number] DOB: 15-Mar-1951 DOA: 01/24/2021 PCP: Barbette Reichmann, MD    No chief complaint on file.   Brief Narrative:  70 year old with prior history of type I diabetes mellitus on insulin pump, peripheral neuropathy, hypertension, underwent lumbar fusion surgery on 12/26/2020 admitted on 01/11/2021 at [facility] for postop wound abscesses, growing Pseudomonas was started on IV cefepime and discharged on 01/16/2021 to complete a 4-week course, presented back to ED on 01/23/2021 with AKI and CT of the abdomen pelvis showing postrenal obstruction and distended bladder.  Patient was transferred to [facility] for AKI and acute metabolic encephalopathy. Patient's AKI felt to be multifactorial secondary to obstruction, acute illness and UTI.  Nephrology consulted and recommendations given unfortunately patient's creatinine has not improved much in the last 1 week. On 02/03/21 patient had an episode of acute respiratory failure with hypoxia secondary to pulmonary edema. Currently she is on [place] oxygen to keep sats greater than 90%.  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 Creatinine peaked to 5.05, has a baseline creatinine of 1.2-1.4. Ultrasound renal with chronic findings without any evidence of hydronephrosis. Patient and family at this time does not want dialysis. Nephrology on board and appreciate recommendations. Overnight patient had respiratory issues and chest x-ray showing pulm edema  requiring a dose of IV lasix.  Creatinine worsened to 4.85 this am, with albumin of 1.7.    Acute metabolic encephalopathy; Appears to have resolved patient is alert and answering questions.  Type 1 diabetes mellitus with neuropathy Insulin-dependent and well controlled. CBG (last 3)  Recent Labs    02/05/21 1618 02/05/21 2141 02/06/21 0652  GLUCAP 201* 207* 84   Continue with the Levemir and sliding scale insulin.    Essential hypertension Blood pressure parameters appear to be optimal at this time.    Hypothyroidism Continue with Synthroid.   Lumbar fusion surgery Abscess growing Pseudomonas. Complete the course of cefepime till 02/15/2021.    Hyponatremia;  Probably from fluid overload.   Anemia of chronic disease:  Sec to recent surgery, acute on chronic illness. S/p 1 unit of prbc transfusion.  Transfuse to keep hemoglobin greater than 7.   Leukocytosis:  Resolved.    Protein calorie Malnutrition Dietary will be consulted.   DVT prophylaxis: scd's Code Status: full code.  Family Communication: Husband.  at bedside. Disposition:   Status is: Inpatient  Remains inpatient appropriate because:Ongoing diagnostic testing needed not appropriate for outpatient work up and IV treatments appropriate due to intensity of illness or inability to take PO  Dispo:  Patient From: Home  Planned Disposition: Home  Medically stable for discharge: No         Consultants:  Nephrology Palliative care.   Procedures: (none.   Antimicrobials:  Antibiotics Given (last 72 hours)     Date/Time Action Medication Dose Rate   02/03/21 1021 New Bag/Given   ceFEPIme (MAXIPIME) 1 g in sodium chloride 0.9 % 100 mL IVPB 1 g 200 mL/hr   02/04/21 0953 New Bag/Given  ceFEPIme (MAXIPIME) 1 g in sodium chloride 0.9 % 100 mL IVPB 1 g 200 mL/hr   02/05/21 0931 New Bag/Given   ceFEPIme (MAXIPIME) 1 g in sodium chloride 0.9 % 100 mL IVPB 1 g 200 mL/hr   02/06/21 0906 New  Bag/Given   ceFEPIme (MAXIPIME) 1 g in sodium chloride 0.9 % 100 mL IVPB 1 g 200 mL/hr         Subjective: Reports feeling better than last night.   Objective: Vitals:   02/05/21 1736 02/05/21 2138 02/06/21 0443 02/06/21 0900  BP: (!) 131/42 (!) 144/51 (!) 122/47 (!) 142/48  Pulse: 80 77 69   Resp: 18 16 19 20   Temp:  97.8 F (36.6 C) 97.6 F (36.4 C) 97.8 F (36.6 C)  TempSrc:  Oral Oral Oral  SpO2: 92% 93% 96% 96%  Weight:  67.2 kg    Height:        Intake/Output Summary (Last 24 hours) at 02/06/2021 1018 Last data filed at 02/06/2021 0600 Gross per 24 hour  Intake 480 ml  Output 1301 ml  Net -821 ml   Filed Weights   02/01/21 0131 02/04/21 0539 02/05/21 2138  Weight: 63.7 kg 61.1 kg 67.2 kg    Examination:  General exam: elderly woman on [place] oxygen, not in distress.  Respiratory system: diminished air entry at bases, no wheezing heard. Tachypnea on talking.  Cardiovascular system: S1 & S2 heard, RRR. No JVD, pedal edema  Gastrointestinal system: Abdomen is nondistended, soft and nontender. Normal bowel sounds heard. Central nervous system: Alert and oriented. Blind, very hard of hearing, bed bound.  Extremities: upper extremities edema present.  Skin: No rashes seen.  Psychiatry: Mood & affect appropriate.     Data Reviewed: I have personally reviewed following labs and imaging studies  CBC: Recent Labs  Lab 02/02/21 0407 02/03/21 0444 02/04/21 0222 02/05/21 0319 02/06/21 0404  WBC 14.2* 13.8* 14.0* 10.1 9.7  NEUTROABS 9.7* 8.9* 9.4* 6.3 6.6  HGB 8.3* 6.9* 9.4* 8.3* 7.8*  HCT 26.1* 22.3* 28.6* 26.4* 25.3*  MCV 100.0 100.9* 95.7 100.0 99.6  PLT 187 145* 164 193 229    Basic Metabolic Panel: Recent Labs  Lab 02/02/21 0407 02/03/21 0444 02/03/21 2356 02/04/21 0222 02/05/21 0319 02/06/21 0404 02/06/21 0409  NA 129* 131* 131* 131* 133*  --  132*  K 3.6 3.4* 3.8 3.7 3.7  --  3.6  CL 98 101 101 100 102  --  104  CO2 20* 22 21* 21* 21*  --   21*  GLUCOSE 63* 61* 201* 137* 111*  --  107*  BUN 52* 54* 53* 54* 54*  --  55*  CREATININE 4.05* 4.14* 4.42* 4.37* 4.59*  --  4.85*  CALCIUM 8.4* 8.0* 7.9* 7.9* 7.8*  --  7.6*  MG 1.7 1.6*  --  1.6* 1.7 1.7  --   PHOS 3.5  3.6 3.5  --  3.7 4.2  4.2  --  4.4    GFR: Estimated Creatinine Clearance: 9.1 mL/min (A) (by C-G formula based on SCr of 4.85 mg/dL (H)).  Liver Function Tests: Recent Labs  Lab 02/02/21 0407 02/03/21 0444 02/04/21 0222 02/05/21 0319 02/06/21 0409  ALBUMIN 1.6* 1.4* 1.5* 1.6* 1.7*    CBG: Recent Labs  Lab 02/05/21 0636 02/05/21 1125 02/05/21 1618 02/05/21 2141 02/06/21 0652  GLUCAP 86 165* 201* 207* 84     No results found for this or any previous visit (from the past 240 hour(s)).  Radiology Studies: DG CHEST PORT 1 VIEW  Result Date: 02/05/2021 CLINICAL DATA:  Shortness of breath. EXAM: PORTABLE CHEST 1 VIEW COMPARISON:  February 03, 2021. FINDINGS: Stable cardiomediastinal silhouette. Left-sided PICC line is unchanged in position. Bilateral perihilar and basilar opacities are noted concerning for edema or atelectasis. Small bilateral pleural effusions are noted. No pneumothorax is noted. Bony thorax is unremarkable. IMPRESSION: Bilateral perihilar and basilar opacities are noted concerning for edema or atelectasis. Small pleural effusions. Electronically Signed   By: Lupita Raider M.D.   On: 02/05/2021 13:24        Scheduled Meds:  amLODipine  5 mg Oral Daily   carvedilol  25 mg Oral BID   Chlorhexidine Gluconate Cloth  6 each Topical Daily   insulin aspart  0-15 Units Subcutaneous TID WC   insulin aspart  0-5 Units Subcutaneous QHS   insulin detemir  8 Units Subcutaneous QHS   levothyroxine  125 mcg Oral Q0600   ondansetron (ZOFRAN) IV  4 mg Intravenous Q6H   pantoprazole  40 mg Oral Daily   Continuous Infusions:  ceFEPime (MAXIPIME) IV 1 g (02/06/21 0906)   ferric gluconate (FERRLECIT) IVPB 250 mg (02/05/21 1222)    promethazine (PHENERGAN) injection (IM or IVPB) 6.25 mg (02/03/21 1653)     LOS: 13 days       Kathlen Mody, MD [facility]   To contact the attending provider between 7A-7P or the covering provider during after hours 7P-7A, please log into the web site [email or web address] and access using universal [place] password for that web site. If you do not have the password, please call the hospital operator.  02/06/2021, 10:18 AM

## 2021-02-07 DIAGNOSIS — Z7189 Other specified counseling: Secondary | ICD-10-CM

## 2021-02-07 DIAGNOSIS — Z66 Do not resuscitate: Secondary | ICD-10-CM

## 2021-02-07 DIAGNOSIS — Z515 Encounter for palliative care: Secondary | ICD-10-CM

## 2021-02-07 LAB — RENAL FUNCTION PANEL
Albumin: 2.8 g/dL — ABNORMAL LOW (ref 3.5–5.0)
Anion gap: 8 (ref 5–15)
BUN: 53 mg/dL — ABNORMAL HIGH (ref 8–23)
CO2: 21 mmol/L — ABNORMAL LOW (ref 22–32)
Calcium: 7.9 mg/dL — ABNORMAL LOW (ref 8.9–10.3)
Chloride: 107 mmol/L (ref 98–111)
Creatinine, Ser: 5.06 mg/dL — ABNORMAL HIGH (ref 0.44–1.00)
GFR, Estimated: 9 mL/min — ABNORMAL LOW (ref >60)
Glucose, Bld: 102 mg/dL — ABNORMAL HIGH (ref 70–99)
Phosphorus: 4.7 mg/dL — ABNORMAL HIGH (ref 2.5–4.6)
Potassium: 3.6 mmol/L (ref 3.5–5.1)
Sodium: 136 mmol/L (ref 135–145)

## 2021-02-07 LAB — CBC WITH DIFFERENTIAL/PLATELET
Abs Immature Granulocytes: 0.23 10*3/uL — ABNORMAL HIGH (ref 0.00–0.07)
Basophils Absolute: 0.1 10*3/uL (ref 0.0–0.1)
Basophils Relative: 1 %
Eosinophils Absolute: 0.3 10*3/uL (ref 0.0–0.5)
Eosinophils Relative: 3 %
HCT: 23.8 % — ABNORMAL LOW (ref 36.0–46.0)
Hemoglobin: 7.5 g/dL — ABNORMAL LOW (ref 12.0–15.0)
Immature Granulocytes: 2 %
Lymphocytes Relative: 16 %
Lymphs Abs: 1.5 10*3/uL (ref 0.7–4.0)
MCH: 31.5 pg (ref 26.0–34.0)
MCHC: 31.5 g/dL (ref 30.0–36.0)
MCV: 100 fL (ref 80.0–100.0)
Monocytes Absolute: 0.9 10*3/uL (ref 0.1–1.0)
Monocytes Relative: 9 %
Neutro Abs: 6.5 10*3/uL (ref 1.7–7.7)
Neutrophils Relative %: 69 %
Platelets: 254 10*3/uL (ref 150–400)
RBC: 2.38 MIL/uL — ABNORMAL LOW (ref 3.87–5.11)
RDW: 15.7 % — ABNORMAL HIGH (ref 11.5–15.5)
WBC: 9.6 10*3/uL (ref 4.0–10.5)
nRBC: 0 % (ref 0.0–0.2)

## 2021-02-07 LAB — GLUCOSE, CAPILLARY
Glucose-Capillary: 56 mg/dL — ABNORMAL LOW (ref 70–99)
Glucose-Capillary: 95 mg/dL (ref 70–99)
Glucose-Capillary: 170 mg/dL — ABNORMAL HIGH (ref 70–99)
Glucose-Capillary: 226 mg/dL — ABNORMAL HIGH (ref 70–99)
Glucose-Capillary: 244 mg/dL — ABNORMAL HIGH (ref 70–99)

## 2021-02-07 LAB — MAGNESIUM: Magnesium: 1.7 mg/dL (ref 1.7–2.4)

## 2021-02-07 MED ORDER — INSULIN ASPART 100 UNIT/ML IJ SOLN
0.0000 [IU] | Freq: Three times a day (TID) | INTRAMUSCULAR | Status: DC
  Administered 2021-02-07: 2 [IU] via SUBCUTANEOUS
  Administered 2021-02-08: 1 [IU] via SUBCUTANEOUS
  Administered 2021-02-08: 4 [IU] via SUBCUTANEOUS

## 2021-02-07 MED ORDER — FUROSEMIDE 10 MG/ML IJ SOLN
20.0000 mg | Freq: Once | INTRAMUSCULAR | Status: AC
  Administered 2021-02-07: 20 mg via INTRAVENOUS
  Filled 2021-02-07: qty 2

## 2021-02-07 MED ORDER — FUROSEMIDE 40 MG PO TABS: 40.0000 mg | ORAL_TABLET | Freq: Every day | ORAL | Status: DC

## 2021-02-07 MED ORDER — INSULIN DETEMIR 100 UNIT/ML ~~LOC~~ SOLN
6.0000 [IU] | Freq: Every day | SUBCUTANEOUS | Status: DC
  Administered 2021-02-07: 6 [IU] via SUBCUTANEOUS
  Filled 2021-02-07 (×2): qty 0.06

## 2021-02-07 MED ORDER — FUROSEMIDE 40 MG PO TABS
40.0000 mg | ORAL_TABLET | Freq: Every day | ORAL | Status: DC
  Administered 2021-02-08: 40 mg via ORAL
  Filled 2021-02-07: qty 1

## 2021-02-07 NOTE — Progress Notes (Signed)
Hypoglycemic Event  CBG: 56  Treatment: 8 oz juice/soda  Symptoms: Hungry  Follow-up CBG: Time:800 CBG Result:95  Possible Reasons for Event: Unknown  Comments/MD notified:yes    Matias Thurman K Zubayr Bednarczyk

## 2021-02-07 NOTE — Progress Notes (Signed)
Physical Therapy Treatment Patient Details Name: Ariana Wilkinson MRN: [id number] DOB: 1951/01/22 Today's Date: 02/07/2021    History of Present Illness 70 yo female presenting to [facility] health ED on 6/15 with confusion and AKI. PMH including critical blindness, type 1 diabetes on insulin pump, s/p lumbar fusion surgery at L5-S1 on 12/26/2020 by Dr. [name], readmitted for post infection/abscess with washout surgery on 01/11/2021, infection site growing Pseudomonas sensitive to cefepime, discharged on 01/16/2021 to complete 4 weeks of IV antibiotics.    PT Comments    Pt sitting up in straight back chair with feet on stool. Pt reports that she is going home with [facility], however she would like to participate in therapy because she wants to be mobile for as long as she can. Pt husband and grandson present during session and helpful in encouragement and equipment management. Pt is min A for transfers and ambulation with RW. Pt will need wheel chair for limited community mobility. PT will continue to follow acutely.     Follow Up Recommendations  Home health PT;Supervision/Assistance - 24 hour     Equipment Recommendations  Wheelchair (measurements PT);Wheelchair cushion (measurements PT)       Precautions / Restrictions Precautions Precautions: Fall;Back Precaution Booklet Issued: No Precaution Comments: no specific order for back precautions Restrictions Weight Bearing Restrictions: No    Mobility  Bed Mobility               General bed mobility comments: OOB in straight back chair    Transfers Overall transfer level: Needs assistance Equipment used: Rolling walker (2 wheeled) Transfers: Sit to/from Stand Sit to Stand: Min assist;+2 safety/equipment         General transfer comment: min Ax2 for standing from straight back chair and recliner, increased multimodal cues due to visual deficits  Ambulation/Gait Ambulation/Gait assistance: +2 safety/equipment Gait  Distance (Feet): 30 Feet (1x30, 1x15, 1x10) Assistive device: Rolling walker (2 wheeled) Gait Pattern/deviations: Step-through pattern;Decreased stride length;Shuffle Gait velocity: slowed Gait velocity interpretation: <1.31 ft/sec, indicative of household ambulator General Gait Details: min A for physical steadying and management of RW, assist for close chair follow and IV pole management         Balance Overall balance assessment: Needs assistance Sitting-balance support: No upper extremity supported;Feet supported Sitting balance-Leahy Scale: Fair     Standing balance support: Bilateral upper extremity supported Standing balance-Leahy Scale: Poor                              Cognition Arousal/Alertness: Awake/alert Behavior During Therapy: WFL for tasks assessed/performed Overall Cognitive Status: Impaired/Different from baseline Area of Impairment: Memory;Following commands;Awareness;Problem solving;Attention                   Current Attention Level: Divided Memory: Decreased short-term memory;Decreased recall of precautions Following Commands: Follows multi-step commands with increased time;Follows multi-step commands consistently   Awareness: Emergent Problem Solving: Slow processing;Requires verbal cues;Requires tactile cues General Comments: pt with much better command follow and sequencing today         General Comments General comments (skin integrity, edema, etc.): husband and grandson present, pt on 3L via [place] with SaO2 95%O2 with ambulation      Pertinent Vitals/Pain Pain Assessment: Faces Faces Pain Scale: Hurts a little bit Pain Location: generalized, back Pain Descriptors / Indicators: Discomfort;Grimacing Pain Intervention(s): Limited activity within patient's tolerance;Monitored during session;Repositioned     PT Goals (current goals can now  be found in the care plan section) Acute Rehab PT Goals Patient Stated Goal: to take her  home PT Goal Formulation: With family Time For Goal Achievement: 02/08/21 Potential to Achieve Goals: Fair Progress towards PT goals: Progressing toward goals    Frequency    Min 3X/week      PT Plan Current plan remains appropriate       AM-PAC PT "6 Clicks" Mobility   Outcome Measure  Help needed turning from your back to your side while in a flat bed without using bedrails?: A Little Help needed moving from lying on your back to sitting on the side of a flat bed without using bedrails?: A Little Help needed moving to and from a bed to a chair (including a wheelchair)?: A Little Help needed standing up from a chair using your arms (e.g., wheelchair or bedside chair)?: A Little Help needed to walk in hospital room?: A Lot Help needed climbing 3-5 steps with a railing? : Total 6 Click Score: 15    End of Session Equipment Utilized During Treatment: Gait belt Activity Tolerance: Patient limited by lethargy Patient left: with call bell/phone within reach;with family/visitor present;in chair Nurse Communication: Mobility status PT Visit Diagnosis: Unsteadiness on feet (R26.81);Other abnormalities of gait and mobility (R26.89)     Time: 1102-1140 PT Time Calculation (min) (ACUTE ONLY): 38 min  Charges:  $Gait Training: 23-37 mins $Therapeutic Activity: 8-22 mins                     Shainna Faux B. Beverely Risen PT, DPT Acute Rehabilitation Services Pager [phone] Office [phone]    Elon Alas Fleet 02/07/2021, 2:36 PM

## 2021-02-07 NOTE — Progress Notes (Signed)
Inpatient Diabetes Program Recommendations  AACE/ADA: New Consensus Statement on Inpatient Glycemic Control (2015)  Target Ranges:  Prepandial:   less than 140 mg/dL      Peak postprandial:   less than 180 mg/dL (1-2 hours)      Critically ill patients:  140 - 180 mg/dL   Lab Results  Component Value Date   GLUCAP 170 (H) 02/07/2021   HGBA1C <4.2 (L) 01/26/2021    Review of Glycemic Control Results for Ariana Wilkinson, Ariana Wilkinson (MRN [id number]) as of 02/07/2021 11:51  Ref. Range 02/06/2021 16:54 02/06/2021 20:42 02/07/2021 07:17 02/07/2021 08:04 02/07/2021 11:09  Glucose-Capillary Latest Ref Range: 70 - 99 mg/dL 086 (H) 578 (H) 56 (L) 95 170 (H)   Inpatient Diabetes Program Recommendations:   Consider: -Decrease Levemir to 6 units qd -Decrease Novolog correction to 0-9 units tid + hs 0-5 units. Secure chat sent to Dr. Blake Divine.  Thank you, Billy Fischer. Anel Purohit, RN, MSN, CDE  Diabetes Coordinator Inpatient Glycemic Control Team Team Pager [phone] (8am-5pm) 02/07/2021 11:52 AM

## 2021-02-07 NOTE — Progress Notes (Signed)
Daily Progress Note   Patient Name: Ariana Wilkinson       Date: 02/07/2021 DOB: 1951/01/09  Age: 70 y.o. MRN#: [id number] Attending Physician: Kathlen Mody, MD Primary Care Physician: Barbette Reichmann, MD Admit Date: 01/24/2021  Reason for Consultation/Follow-up: Establishing goals of care  Subjective: Patient reports feeling well - no pain. Spouse at bedside.   Length of Stay: 14  Current Medications: Scheduled Meds:  . amLODipine  5 mg Oral Daily  . carvedilol  25 mg Oral BID  . Chlorhexidine Gluconate Cloth  6 each Topical Daily  . [START ON 02/08/2021] furosemide  40 mg Oral Daily  . insulin aspart  0-15 Units Subcutaneous TID WC  . insulin aspart  0-5 Units Subcutaneous QHS  . insulin detemir  8 Units Subcutaneous QHS  . levothyroxine  125 mcg Oral Q0600  . ondansetron (ZOFRAN) IV  4 mg Intravenous Q6H  . pantoprazole  40 mg Oral Daily    Continuous Infusions: . ceFEPime (MAXIPIME) IV 1 g (02/07/21 1029)  . promethazine (PHENERGAN) injection (IM or IVPB) 6.25 mg (02/03/21 1653)    PRN Meds: acetaminophen, oxyCODONE, promethazine (PHENERGAN) injection (IM or IVPB), senna-docusate, sodium chloride flush  Physical Exam Constitutional:      General: She is not in acute distress. Pulmonary:     Effort: Pulmonary effort is normal.  Skin:    General: Skin is warm and dry.  Neurological:     Mental Status: She is alert and oriented to person, place, and time.  Psychiatric:        Mood and Affect: Mood normal.        Behavior: Behavior normal.            Vital Signs: BP (!) 146/72 (BP Location: Right Arm)   Pulse 74   Temp 98.2 F (36.8 C) (Oral)   Resp 20   Ht 4\' 11"  (1.499 m)   Wt 61.2 kg   SpO2 100%   BMI 27.25 kg/m  SpO2: SpO2: 100 % O2 Device: O2 Device: Nasal Cannula O2  Flow Rate: O2 Flow Rate (L/min): 3 L/min  Intake/output summary:  Intake/Output Summary (Last 24 hours) at 02/07/2021 1330 Last data filed at 02/07/2021 0600 Gross per 24 hour  Intake 865.53 ml  Output 926 ml  Net -60.47 ml   LBM: Last BM Date: 02/06/21 Baseline Weight: Weight: 54 kg Most recent weight: Weight: 61.2 kg       Palliative Assessment/Data: PPS 40%      Patient Active Problem List   Diagnosis Date Noted  . Overweight (BMI 25.0-29.9) 01/30/2021  . 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 Essential hypertension 01/30/2021  . Unspecified protein-calorie malnutrition (HCC) 01/30/2021  . Hypothyroidism 01/30/2021  . Acute lower UTI 01/30/2021  . Acute urinary retention 01/25/2021  . Metabolic acidosis, normal anion gap (NAG) 01/25/2021  . Acute metabolic encephalopathy 01/25/2021  . AKI (acute kidney injury) (HCC) 01/24/2021    Palliative Care Assessment & Plan   HPI: 70 y.o. female  with past medical history of DM1 on insulin pump, HTN s/p lumbar fusion surgery on 5/18 with multiple readmissions due to postop wound abscess admitted on 01/24/2021 with acute  kidney injury. She is not improving. Now having SOB and pulmonary edema. Nephrology consulted- patient does not want dialysis. Palliative medicine consulted for GOC.   Assessment: Family meeting to include patient, spouse, son, and DIL. We reviewed patient's current condition - specifically her renal failure and not wanting to do HD/not a good candidate for HD. DIL tearful, asks about kidney transplant. We reviewed why this is not an option. Family expresses understanding and all questions addressed. We reviewed poor prognosis d/t renal failure. Family expresses understanding. We reviewed patient's previously stated wishes: to be at home and focus on comfort/quality of life. We reviewed support at home through hospice - reviewed hospice philosophy of care and type of support provided.  Patient and family agree.  We reviewed expectations as her renal failure progresses to death. Patient and family express understanding.  Encouraged patient/family to consider DNR/DNI status understanding evidenced based poor outcomes in similar hospitalized patients, as the cause of the arrest is likely associated with chronic/terminal disease rather than a reversible acute cardio-pulmonary event. Patient and family agree to DNR.  She does share that she feels more short of breath this morning and asks if she will receive more lasix - we discussed deferring to Dr. Blake Divine.   Discussed above plan with Dr. Blake Divine and [facility] team.   Signed DNR placed on chart.   Recommendations/Plan: Code status change to DNR NO HD Home with hospice support once equipment is delivered and patient's symptoms adequately managed She has requested avoiding opioids for now  Goals of Care and Additional Recommendations: Limitations on Scope of Treatment: No Hemodialysis  Code Status: DNR  Prognosis:  Weeks - months  Discharge Planning: Home with Hospice  Care plan was discussed with patient, family, dr. Blake Divine, [facility] team  Thank you for allowing the Palliative Medicine Team to assist in the care of this patient.   Total Time 60 minutes Prolonged Time Billed  no       Greater than 50%  of this time was spent counseling and coordinating care related to the above assessment and plan.  Gerlean Ren, DNP, [facility] Palliative Medicine Team Team Phone # [phone]  Pager [phone]

## 2021-02-07 NOTE — Progress Notes (Signed)
PROGRESS NOTE    Ariana Wilkinson  WUJ:[id number] DOB: [birth year] DOA: 01/24/2021 PCP: Barbette Reichmann, MD    No chief complaint on file.   Brief Narrative:  70 year old with prior history of type I diabetes mellitus on insulin pump, peripheral neuropathy, hypertension, underwent lumbar fusion surgery on 12/26/2020 admitted on 01/11/2021 at [facility] for postop wound abscesses, growing Pseudomonas was started on IV cefepime and discharged on 01/16/2021 to complete a 4-week course, presented back to ED on 01/23/2021 with AKI and CT of the abdomen pelvis showing postrenal obstruction and distended bladder.  Patient was transferred to [facility] for AKI and acute metabolic encephalopathy. Patient's AKI felt to be multifactorial secondary to obstruction, acute illness and UTI.  Nephrology consulted and recommendations given unfortunately patient's creatinine has not improved much in the last 1 week. On 02/03/21 patient had an episode of acute respiratory failure with hypoxia secondary to pulmonary edema. Currently she is on [place] oxygen to keep sats greater than 90%.  In view of her worsening creatinine , poor progression, palliative care consulted for goals of care, after futher discussions with family and patient, a decision was made to transition to comfort measures.   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 Creatinine peaked to 5.05, has a baseline creatinine of 1.2-1.4. Ultrasound renal with chronic findings without any  evidence of hydronephrosis. Patient and family at this time does not want dialysis. Nephrology on board and appreciate recommendations. Overnight patient had respiratory issues and chest x-ray showing pulm edema requiring a dose of IV lasix.  Creatinine worsened to 5 this am, despite IV lasix.    Acute metabolic encephalopathy; Appears to have resolved patient is alert and answering questions.  Type 1 diabetes mellitus with neuropathy Insulin-dependent and well controlled. CBG (last 3)  Recent Labs    02/07/21 0717 02/07/21 0804 02/07/21 1109  GLUCAP 56* 95 170*    Decrease the dose of levemir and SSI.     Essential hypertension Well controlled.     Hypothyroidism Continue with Synthroid.   Lumbar fusion surgery Abscess growing Pseudomonas. Complete the course of cefepime till 02/15/2021.    Hyponatremia;  Probably from fluid overload.  Resolved.   Anemia of chronic disease:  Sec to recent surgery, acute on chronic illness. S/p 1 unit of prbc transfusion.  Transfuse to keep hemoglobin greater than 7.   Leukocytosis:  Resolved.    Protein calorie Malnutrition Dietary will be consulted.  In view of multiple medical problems, co morbidities, worsening renal function, clinical deterioration, palliative care consulted for goals of care, and she was transitioned to comfort measures.    DVT prophylaxis: scd's Code Status: DNR  Family Communication: Husband.  at bedside. Disposition:   Status is: Inpatient  Remains inpatient appropriate because:Ongoing diagnostic testing needed not appropriate for outpatient work up and IV treatments appropriate due to intensity of illness or inability to take PO  Dispo:  Patient From: Home  Planned Disposition: Home  Medically stable for discharge: No         Consultants:  Nephrology Palliative care.   Procedures: (none.   Antimicrobials:  Antibiotics Given (last 72 hours)     Date/Time Action Medication  Dose Rate   02/05/21 0931 New Bag/Given   ceFEPIme (MAXIPIME) 1 g in sodium chloride 0.9 % 100 mL IVPB 1 g 200 mL/hr   02/06/21 0906 New Bag/Given   ceFEPIme (MAXIPIME) 1 g in sodium chloride 0.9 % 100 mL IVPB 1 g 200 mL/hr   02/07/21 1029 New Bag/Given   ceFEPIme (MAXIPIME) 1 g in sodium chloride 0.9 % 100 mL IVPB 1 g 200 mL/hr         Subjective: Reports feeling better than last night.   Objective: Vitals:   02/06/21 1700 02/06/21 2043 02/07/21 0746 02/07/21 1022  BP: (!) 133/50 (!) 143/60  (!) 146/72  Pulse: 71 77  74  Resp: 18 18  20   Temp: 98 F (36.7 C) 98 F (36.7 C)  98.2 F (36.8 C)  TempSrc: Oral   Oral  SpO2: 94% 100%  100%  Weight:   61.2 kg   Height:        Intake/Output Summary (Last 24 hours) at 02/07/2021 1619 Last data filed at 02/07/2021 1400 Gross per 24 hour  Intake 616.01 ml  Output 926 ml  Net -309.99 ml    Filed Weights   02/04/21 0539 02/05/21 2138 02/07/21 0746  Weight: 61.1 kg 67.2 kg 61.2 kg    Examination:  General exam: elderly woman on [place] oxygen, not in distress.  Respiratory system: diminished air entry at bases, no wheezing heard. Tachypnea on talking.  Cardiovascular system: S1 & S2 heard, RRR. No JVD, pedal edema  Gastrointestinal system: Abdomen is nondistended, soft and nontender. Normal bowel sounds heard. Central nervous system: Alert and oriented. Blind, very hard of hearing, bed bound.  Extremities: upper extremities edema present.  Skin: No rashes seen.  Psychiatry: Mood & affect appropriate.     Data Reviewed: I have personally reviewed following labs and imaging studies  CBC: Recent Labs  Lab 02/03/21 0444 02/04/21 0222 02/05/21 0319 02/06/21 0404 02/07/21 0329  WBC 13.8* 14.0* 10.1 9.7 9.6  NEUTROABS 8.9* 9.4* 6.3 6.6 6.5  HGB 6.9* 9.4* 8.3* 7.8* 7.5*  HCT 22.3* 28.6* 26.4* 25.3* 23.8*  MCV 100.9* 95.7 100.0 99.6 100.0  PLT 145* 164 193 229 254     Basic Metabolic Panel: Recent Labs  Lab  02/03/21 0444 02/03/21 2356 02/04/21 0222 02/05/21 0319 02/06/21 0404 02/06/21 0409 02/07/21 0329  NA 131* 131* 131* 133*  --  132* 136  K 3.4* 3.8 3.7 3.7  --  3.6 3.6  CL 101 101 100 102  --  104 107  CO2 22 21* 21* 21*  --  21* 21*  GLUCOSE 61* 201* 137* 111*  --  107* 102*  BUN 54* 53* 54* 54*  --  55* 53*  CREATININE 4.14* 4.42* 4.37* 4.59*  --  4.85* 5.06*  CALCIUM 8.0* 7.9* 7.9* 7.8*  --  7.6* 7.9*  MG 1.6*  --  1.6* 1.7 1.7  --  1.7  PHOS 3.5  --  3.7 4.2  4.2  --  4.4 4.7*     GFR: Estimated Creatinine Clearance: 8.3 mL/min (A) (by C-G formula based on SCr of 5.06 mg/dL (H)).  Liver Function Tests: Recent Labs  Lab 02/03/21 0444 02/04/21 0222 02/05/21 0319 02/06/21 0409 02/07/21 0329  ALBUMIN 1.4* 1.5* 1.6* 1.7* 2.8*     CBG: Recent Labs  Lab 02/06/21 1654 02/06/21 2042  02/07/21 0717 02/07/21 0804 02/07/21 1109  GLUCAP 248* 202* 56* 95 170*      No results found for this or any previous visit (from the past 240 hour(s)).       Radiology Studies: ECHOCARDIOGRAM COMPLETE  Result Date: 02/06/2021    ECHOCARDIOGRAM REPORT   Patient Name:   MEYA CLUTTER Date of Exam: 02/06/2021 Medical Rec #:  [id number]     Height:       59.0 in Accession #:    [id number]    Weight:       148.1 lb Date of Birth:  [birth year]    BSA:          1.624 m Patient Age:    69 years      BP:           122/47 mmHg Patient Gender: F             HR:           69 bpm. Exam Location:  Inpatient Procedure: 2D Echo, Cardiac Doppler and Color Doppler                               MODIFIED REPORT: This report was modified by Weston Brass MD on 02/06/2021 due to revision.  Indications:     Acute resp. distress  History:         Patient has no prior history of Echocardiogram examinations.                  Signs/Symptoms:AKI, pul.edema, resp. failure; Risk                  Factors:Diabetes and Hypertension.  Sonographer:     Lavenia Atlas Referring Phys:  Herminio Heads Diagnosing  Phys: Weston Brass MD IMPRESSIONS  1. Left ventricular ejection fraction, by estimation, is 70 to 75%. The left ventricle has hyperdynamic function. The left ventricle has no regional wall motion abnormalities. Left ventricular diastolic parameters are consistent with Grade I diastolic dysfunction (impaired relaxation).  2. Right ventricular systolic function is normal. The right ventricular size is mildly enlarged. There is moderately elevated pulmonary artery systolic pressure. The estimated right ventricular systolic pressure is 49.8 mmHg.  3. A small pericardial effusion is present. The pericardial effusion is circumferential. There is no evidence of cardiac tamponade.  4. The mitral valve is grossly normal. Trivial mitral valve regurgitation. No evidence of mitral stenosis.  5. The aortic valve is grossly normal. Aortic valve regurgitation is not visualized. No aortic stenosis is present.  6. The inferior vena cava is dilated in size with <50% respiratory variability, suggesting right atrial pressure of 15 mmHg. FINDINGS  Left Ventricle: Left ventricular ejection fraction, by estimation, is 70 to 75%. The left ventricle has hyperdynamic function. The left ventricle has no regional wall motion abnormalities. The left ventricular internal cavity size was normal in size. There is no left ventricular hypertrophy. Left ventricular diastolic parameters are consistent with Grade I diastolic dysfunction (impaired relaxation). Right Ventricle: The right ventricular size is mildly enlarged. No increase in right ventricular wall thickness. Right ventricular systolic function is normal. There is moderately elevated pulmonary artery systolic pressure. The tricuspid regurgitant velocity is 2.95 m/s, and with an assumed right atrial pressure of 15 mmHg, the estimated right ventricular systolic pressure is 49.8 mmHg. Left Atrium: Left atrial size was normal in size. Right Atrium: Right atrial size was normal in  size.  Pericardium: A small pericardial effusion is present. The pericardial effusion is circumferential. There is no evidence of cardiac tamponade. Mitral Valve: The mitral valve is grossly normal. Mild mitral annular calcification. Trivial mitral valve regurgitation. No evidence of mitral valve stenosis. Tricuspid Valve: The tricuspid valve is normal in structure. Tricuspid valve regurgitation is mild . No evidence of tricuspid stenosis. Aortic Valve: The aortic valve is grossly normal. Aortic valve regurgitation is not visualized. No aortic stenosis is present. Aortic valve mean gradient measures 7.0 mmHg. Aortic valve peak gradient measures 11.4 mmHg. Aortic valve area, by VTI measures  1.61 cm. Pulmonic Valve: The pulmonic valve was normal in structure. Pulmonic valve regurgitation is mild. No evidence of pulmonic stenosis. Aorta: The aortic root is normal in size and structure. Venous: The inferior vena cava is dilated in size with less than 50% respiratory variability, suggesting right atrial pressure of 15 mmHg. IAS/Shunts: No atrial level shunt detected by color flow Doppler. Additional Comments: A venous catheter is visualized in the right atrium.  LEFT VENTRICLE PLAX 2D LVIDd:         4.00 cm  Diastology LVIDs:         2.20 cm  LV e' medial:    5.98 cm/s LV PW:         0.90 cm  LV E/e' medial:  18.9 LV IVS:        0.90 cm  LV e' lateral:   8.27 cm/s LVOT diam:     1.80 cm  LV E/e' lateral: 13.7 LV SV:         69 LV SV Index:   42 LVOT Area:     2.54 cm  RIGHT VENTRICLE RV Basal diam:  2.60 cm RV S prime:     14.70 cm/s TAPSE (M-mode): 1.9 cm LEFT ATRIUM             Index       RIGHT ATRIUM          Index LA diam:        3.40 cm 2.09 cm/m  RA Area:     9.61 cm LA Vol (A2C):   28.0 ml 17.25 ml/m RA Volume:   19.00 ml 11.70 ml/m LA Vol (A4C):   28.0 ml 17.25 ml/m LA Biplane Vol: 29.1 ml 17.92 ml/m  AORTIC VALVE AV Area (Vmax):    1.66 cm AV Area (Vmean):   1.60 cm AV Area (VTI):     1.61 cm AV Vmax:            168.50 cm/s AV Vmean:          125.000 cm/s AV VTI:            0.426 m AV Peak Grad:      11.4 mmHg AV Mean Grad:      7.0 mmHg LVOT Vmax:         110.00 cm/s LVOT Vmean:        78.600 cm/s LVOT VTI:          0.270 m LVOT/AV VTI ratio: 0.63  AORTA Ao Root diam: 2.40 cm MITRAL VALVE                TRICUSPID VALVE MV Area (PHT): 5.16 cm     TR Peak grad:   34.8 mmHg MV Decel Time: 147 msec     TR Vmax:        295.00 cm/s MV E velocity: 113.00 cm/s MV A velocity:  119.00 cm/s  SHUNTS MV E/A ratio:  0.95         Systemic VTI:  0.27 m                             Systemic Diam: 1.80 cm Weston Brass MD Electronically signed by Weston Brass MD Signature Date/Time: 02/06/2021/5:19:59 PM    Final (Updated)         Scheduled Meds:  amLODipine  5 mg Oral Daily   carvedilol  25 mg Oral BID   Chlorhexidine Gluconate Cloth  6 each Topical Daily   [START ON 02/08/2021] furosemide  40 mg Oral Daily   insulin aspart  0-5 Units Subcutaneous QHS   insulin aspart  0-6 Units Subcutaneous TID WC   insulin detemir  6 Units Subcutaneous QHS   levothyroxine  125 mcg Oral Q0600   ondansetron (ZOFRAN) IV  4 mg Intravenous Q6H   pantoprazole  40 mg Oral Daily   Continuous Infusions:  ceFEPime (MAXIPIME) IV 1 g (02/07/21 1029)   promethazine (PHENERGAN) injection (IM or IVPB) 6.25 mg (02/03/21 1653)     LOS: 14 days       Kathlen Mody, MD [facility]   To contact the attending provider between 7A-7P or the covering provider during after hours 7P-7A, please log into the web site [email or web address] and access using universal [place] password for that web site. If you do not have the password, please call the hospital operator.  02/07/2021, 4:19 PM

## 2021-02-07 NOTE — TOC Progression Note (Addendum)
Transition of Care [facility]) - Progression Note    Patient Details  Name: Ariana Wilkinson MRN: [id number] Date of Birth: 21-Nov-1950  Transition of Care [facility]) CM/SW Contact  Epifanio Lesches, RN Phone Number: 02/07/2021, 10:16 AM  Clinical Narrative:    NCM received consult: Hospice at home. NCM spoke with pt/husband regarding disposition, home with hospice care via phone, pt confirmed d/c plan. Choice given. [facility] selected. Referral  made with  [facility] [phone]) and clinicals faxed to [phone].  NCM liaison aware of DME needs( hospital bed , oxygen and W/C). Acceptance pending.  TOC team will continue to monitor and assist with needs....  02/08/2021 @ 11:54 am   NCM received call from [facility] hospice and informed approval for home hospice care received. Awaiting equipment delivery ( ex. Hospital bed , oxygen)  to pt home. Once DME delivered pt can d/c to home.   02/08/2021 @ 1500 pm Per MD IV ABX therapy d/c for home infusion, ok to d/c PICC line. Foley to remain in place, hospice to assume care.   Expected Discharge Plan: Home w Hospice Care Barriers to Discharge: Continued Medical Work up  Expected Discharge Plan and Services Expected Discharge Plan: Home w Hospice Care In-house Referral: Clinical Social Work Discharge Planning Services: CM Consult Post Acute Care Choice: Home Health Living arrangements for the past 2 months: Single Family Home                 DME Arranged: N/A DME Agency: NA       HH Arranged: RN, PT HH Agency: [facility] Date [facility] Agency Contacted: 01/28/21 Time HH Agency Contacted: 1045 Representative spoke with at [facility] Agency: Marcelino Duster   Social Determinants of Health (SDOH) Interventions    Readmission Risk Interventions No flowsheet data found.

## 2021-02-07 NOTE — Progress Notes (Addendum)
[facility] NEPHROLOGY PROGRESS NOTE  Assessment/ Plan: Pt is a 70 y.o. yo female with history of DM status post lumbar fusion surgery in 12/2020, readmitted for infection and had a washout on 6/3, presented to [facility] on 6/15 because of AKI with creatinine level of 5.05.  #Acute kidney injury on CKD with baseline creatinine level around 1.2-1.4 with recent back surgery, infection and hospitalization.  UA with minimal proteinuria, no M spike.  Kidney ultrasound with chronic findings without hydronephrosis.  She has been receiving diuretics intermittently with some clinical response.  Unfortunately, the creatinine level continue to worsen.  The echo with diastolic CHF.  Seen by palliative care team and patient is now DNR with plan to go home with hospice care.  I have discussed with the patient's husband.  Also discussed with her son and daughter-in-law over the phone.  I think the dialysis will be challenging for her and will not increase the quality of life.  The family agreed to proceed with hospice care. She is blind and hard of hearing.  #Hypertension/volume: Continue current antihypertensive.  I will resume oral Lasix.  #Hyponatremia, hypervolemic: Improved.  Monitor lab.  #Anemia due to chronic disease and recent surgery/hospitalization: Received PRBC.  Hemoglobin is stable today.  Iron saturation 22%, ordered iron and ESA.  #Hypoalbuminemia: Treated with IV albumin to augment diuretics effect.  Encourage oral intake.  #Acute hypoxic respiratory failure: Requiring oxygen.  Echocardiogram reviewed.  Restarting oral diuretics.  Discussed with the patient's husband. Also discussed with the patient's son and daughter-in-law over the phone.  I have nothing much to offer at this time.  Plan as outlined above..  I will sign off, please call back with question.  Subjective: Seen and examined at bedside.  Urine output is around 1.2 L.  She reported feeling better last night  however started having some shortness of breath.  Denies chest pain, nausea or vomiting.  She just got Lasix 20 mg IV.  Her aspirin at bed side.  He called his daughter in law and son over the phone to discuss together. Objective Vital signs in last 24 hours: Vitals:   02/06/21 1700 02/06/21 2043 02/07/21 0746 02/07/21 1022  BP: (!) 133/50 (!) 143/60  (!) 146/72  Pulse: 71 77  74  Resp: Temp: 98 F (36.7 C) 98 F (36.7 C)  98.2 F (36.8 C)  TempSrc: Oral   Oral  SpO2: 94% 100%  100%  Weight:   61.2 kg   Height:       Weight change:   Intake/Output Summary (Last 24 hours) at 02/07/2021 1107 Last data filed at 02/07/2021 0600 Gross per 24 hour  Intake 985.53 ml  Output 926 ml  Net 59.53 ml        Labs: Basic Metabolic Panel: Recent Labs  Lab 02/05/21 0319 02/06/21 0409 02/07/21 0329  NA 133* 132* 136  K 3.7 3.6 3.6  CL 102 104 107  CO2 21* 21* 21*  GLUCOSE 111* 107* 102*  BUN 54* 55* 53*  CREATININE 4.59* 4.85* 5.06*  CALCIUM 7.8* 7.6* 7.9*  PHOS 4.2  4.2 4.4 4.7*    Liver Function Tests: Recent Labs  Lab 02/05/21 0319 02/06/21 0409 02/07/21 0329  ALBUMIN 1.6* 1.7* 2.8*    No results for input(s): LIPASE, AMYLASE in the last 168 hours. No results for input(s): AMMONIA in the last 168 hours. CBC: Recent Labs  Lab 02/03/21 0444 02/04/21 0222 02/05/21 0319 02/06/21 0404  02/07/21 0329  WBC 13.8* 14.0* 10.1 9.7 9.6  NEUTROABS 8.9* 9.4* 6.3 6.6 6.5  HGB 6.9* 9.4* 8.3* 7.8* 7.5*  HCT 22.3* 28.6* 26.4* 25.3* 23.8*  MCV 100.9* 95.7 100.0 99.6 100.0  PLT 145* 164 193 229 254    Cardiac Enzymes: No results for input(s): CKTOTAL, CKMB, CKMBINDEX, TROPONINI in the last 168 hours. CBG: Recent Labs  Lab 02/06/21 1130 02/06/21 1654 02/06/21 2042 02/07/21 0717 02/07/21 0804  GLUCAP 195* 248* 202* 56* 95     Iron Studies:  Recent Labs    02/05/21 0319  IRON 40  TIBC 183*  FERRITIN 86    Studies/Results: DG CHEST PORT 1  VIEW  Result Date: 02/05/2021 CLINICAL DATA:  Shortness of breath. EXAM: PORTABLE CHEST 1 VIEW COMPARISON:  February 03, 2021. FINDINGS: Stable cardiomediastinal silhouette. Left-sided PICC line is unchanged in position. Bilateral perihilar and basilar opacities are noted concerning for edema or atelectasis. Small bilateral pleural effusions are noted. No pneumothorax is noted. Bony thorax is unremarkable. IMPRESSION: Bilateral perihilar and basilar opacities are noted concerning for edema or atelectasis. Small pleural effusions. Electronically Signed   By: Lupita Raider M.D.   On: 02/05/2021 13:24   ECHOCARDIOGRAM COMPLETE  Result Date: 02/06/2021    ECHOCARDIOGRAM REPORT   Patient Name:   Ariana Wilkinson Date of Exam: 02/06/2021 Medical Rec #:  [id number]     Height:       59.0 in Accession #:    [id number]    Weight:       148.1 lb Date of Birth:  [birth year]    BSA:          1.624 m Patient Age:    69 years      BP:           122/47 mmHg Patient Gender: F             HR:           69 bpm. Exam Location:  Inpatient Procedure: 2D Echo, Cardiac Doppler and Color Doppler                               MODIFIED REPORT: This report was modified by Weston Brass MD on 02/06/2021 due to revision.  Indications:     Acute resp. distress  History:         Patient has no prior history of Echocardiogram examinations.                  Signs/Symptoms:AKI, pul.edema, resp. failure; Risk                  Factors:Diabetes and Hypertension.  Sonographer:     Lavenia Atlas Referring Phys:  Herminio Heads Diagnosing Phys: Weston Brass MD IMPRESSIONS  1. Left ventricular ejection fraction, by estimation, is 70 to 75%. The left ventricle has hyperdynamic function. The left ventricle has no regional wall motion abnormalities. Left ventricular diastolic parameters are consistent with Grade I diastolic dysfunction (impaired relaxation).  2. Right ventricular systolic function is normal. The right ventricular size is mildly  enlarged. There is moderately elevated pulmonary artery systolic pressure. The estimated right ventricular systolic pressure is 49.8 mmHg.  3. A small pericardial effusion is present. The pericardial effusion is circumferential. There is no evidence of cardiac tamponade.  4. The mitral valve is grossly normal. Trivial mitral valve regurgitation. No evidence of mitral stenosis.  5. The aortic  valve is grossly normal. Aortic valve regurgitation is not visualized. No aortic stenosis is present.  6. The inferior vena cava is dilated in size with <50% respiratory variability, suggesting right atrial pressure of 15 mmHg. FINDINGS  Left Ventricle: Left ventricular ejection fraction, by estimation, is 70 to 75%. The left ventricle has hyperdynamic function. The left ventricle has no regional wall motion abnormalities. The left ventricular internal cavity size was normal in size. There is no left ventricular hypertrophy. Left ventricular diastolic parameters are consistent with Grade I diastolic dysfunction (impaired relaxation). Right Ventricle: The right ventricular size is mildly enlarged. No increase in right ventricular wall thickness. Right ventricular systolic function is normal. There is moderately elevated pulmonary artery systolic pressure. The tricuspid regurgitant velocity is 2.95 m/s, and with an assumed right atrial pressure of 15 mmHg, the estimated right ventricular systolic pressure is 49.8 mmHg. Left Atrium: Left atrial size was normal in size. Right Atrium: Right atrial size was normal in size. Pericardium: A small pericardial effusion is present. The pericardial effusion is circumferential. There is no evidence of cardiac tamponade. Mitral Valve: The mitral valve is grossly normal. Mild mitral annular calcification. Trivial mitral valve regurgitation. No evidence of mitral valve stenosis. Tricuspid Valve: The tricuspid valve is normal in structure. Tricuspid valve regurgitation is mild . No evidence of  tricuspid stenosis. Aortic Valve: The aortic valve is grossly normal. Aortic valve regurgitation is not visualized. No aortic stenosis is present. Aortic valve mean gradient measures 7.0 mmHg. Aortic valve peak gradient measures 11.4 mmHg. Aortic valve area, by VTI measures  1.61 cm. Pulmonic Valve: The pulmonic valve was normal in structure. Pulmonic valve regurgitation is mild. No evidence of pulmonic stenosis. Aorta: The aortic root is normal in size and structure. Venous: The inferior vena cava is dilated in size with less than 50% respiratory variability, suggesting right atrial pressure of 15 mmHg. IAS/Shunts: No atrial level shunt detected by color flow Doppler. Additional Comments: A venous catheter is visualized in the right atrium.  LEFT VENTRICLE PLAX 2D LVIDd:         4.00 cm  Diastology LVIDs:         2.20 cm  LV e' medial:    5.98 cm/s LV PW:         0.90 cm  LV E/e' medial:  18.9 LV IVS:        0.90 cm  LV e' lateral:   8.27 cm/s LVOT diam:     1.80 cm  LV E/e' lateral: 13.7 LV SV:         69 LV SV Index:   42 LVOT Area:     2.54 cm  RIGHT VENTRICLE RV Basal diam:  2.60 cm RV S prime:     14.70 cm/s TAPSE (M-mode): 1.9 cm LEFT ATRIUM             Index       RIGHT ATRIUM          Index LA diam:        3.40 cm 2.09 cm/m  RA Area:     9.61 cm LA Vol (A2C):   28.0 ml 17.25 ml/m RA Volume:   19.00 ml 11.70 ml/m LA Vol (A4C):   28.0 ml 17.25 ml/m LA Biplane Vol: 29.1 ml 17.92 ml/m  AORTIC VALVE AV Area (Vmax):    1.66 cm AV Area (Vmean):   1.60 cm AV Area (VTI):     1.61 cm AV Vmax:  168.50 cm/s AV Vmean:          125.000 cm/s AV VTI:            0.426 m AV Peak Grad:      11.4 mmHg AV Mean Grad:      7.0 mmHg LVOT Vmax:         110.00 cm/s LVOT Vmean:        78.600 cm/s LVOT VTI:          0.270 m LVOT/AV VTI ratio: 0.63  AORTA Ao Root diam: 2.40 cm MITRAL VALVE                TRICUSPID VALVE MV Area (PHT): 5.16 cm     TR Peak grad:   34.8 mmHg MV Decel Time: 147 msec     TR Vmax:         295.00 cm/s MV E velocity: 113.00 cm/s MV A velocity: 119.00 cm/s  SHUNTS MV E/A ratio:  0.95         Systemic VTI:  0.27 m                             Systemic Diam: 1.80 cm Weston Brass MD Electronically signed by Weston Brass MD Signature Date/Time: 02/06/2021/5:19:59 PM    Final (Updated)     Medications: Infusions:  ceFEPime (MAXIPIME) IV 1 g (02/07/21 1029)   promethazine (PHENERGAN) injection (IM or IVPB) 6.25 mg (02/03/21 1653)    Scheduled Medications:  amLODipine  5 mg Oral Daily   carvedilol  25 mg Oral BID   Chlorhexidine Gluconate Cloth  6 each Topical Daily   insulin aspart  0-15 Units Subcutaneous TID WC   insulin aspart  0-5 Units Subcutaneous QHS   insulin detemir  8 Units Subcutaneous QHS   levothyroxine  125 mcg Oral Q0600   ondansetron (ZOFRAN) IV  4 mg Intravenous Q6H   pantoprazole  40 mg Oral Daily    have reviewed scheduled and prn medications.  Physical Exam: General:NAD, comfortable Heart:RRR, s1s2 nl, no rubs Lungs: Some basal coarse breath sound, no increased work of breathing Abdomen:soft, Non-tender, non-distended Extremities: Bilateral upper and lower extremities has some pitting edema present. Neurology: Alert, awake, following commands  Ariana Wilkinson 02/07/2021,11:07 AM  LOS: 14 days

## 2021-02-07 NOTE — Progress Notes (Signed)
Occupational Therapy Treatment Patient Details Name: Ariana Wilkinson MRN: [id number] DOB: 1951/06/11 Today's Date: 02/07/2021    History of present illness 70 yo female presenting to [facility] health ED on 6/15 with confusion and AKI. PMH including critical blindness, type 1 diabetes on insulin pump, s/p lumbar fusion surgery at L5-S1 on 12/26/2020 by Dr. [name], readmitted for post infection/abscess with washout surgery on 01/11/2021, infection site growing Pseudomonas sensitive to cefepime, discharged on 01/16/2021 to complete 4 weeks of IV antibiotics.   OT comments  Pt reports plan to dc home with hospice, but eager to participate in therapy to reduce burden of care on family and remain as strong as possible.  Pt completing bed mobility with min-mod assist, grooming at EOB with setup assist (to comb hair) and toileting with total assist.  Transfers with min assist +2 for safety.  She fatigues easily and SOB with minimal activity, cueing for PLB and resting. Will follow acutely.    Follow Up Recommendations  Home health OT;Supervision/Assistance - 24 hour    Equipment Recommendations  None recommended by OT    Recommendations for Other Services      Precautions / Restrictions Precautions Precautions: Fall;Back Precaution Booklet Issued: No Precaution Comments: no specific order for back precautions Restrictions Weight Bearing Restrictions: No       Mobility Bed Mobility Overal bed mobility: Needs Assistance Bed Mobility: Rolling;Sidelying to Sit;Sit to Sidelying Rolling: Min assist Sidelying to sit: Min assist     Sit to sidelying: Mod assist General bed mobility comments: min assist to elevate trunk to EOB, mod assist for LB support back to supine    Transfers Overall transfer level: Needs assistance Equipment used: 1 person hand held assist Transfers: Sit to/from Stand Sit to Stand: Min assist;+2 safety/equipment         General transfer comment: spouse at side for  safety, sit to stand with min assist with mulitmodal cueing for visual deficits    Balance Overall balance assessment: Needs assistance Sitting-balance support: No upper extremity supported;Feet supported Sitting balance-Leahy Scale: Fair Sitting balance - Comments: close supervision for safety   Standing balance support: Bilateral upper extremity supported;During functional activity Standing balance-Leahy Scale: Poor                             ADL either performed or assessed with clinical judgement   ADL Overall ADL's : Needs assistance/impaired     Grooming: Set up;Sitting;Brushing hair Grooming Details (indicate cue type and reason): removing hair clip, brushing hair with setup assist; fatigues easily                 Toilet Transfer: Minimal assistance Toilet Transfer Details (indicate cue type and reason): side stepping towards [facility] Toileting- Clothing Manipulation and Hygiene: Total assistance;Sit to/from stand       Functional mobility during ADLs: Minimal assistance;Cueing for safety General ADL Comments: pt limited by decreased activity tolerance, fatigues easily     Vision       Perception     Praxis      Cognition Arousal/Alertness: Awake/alert Behavior During Therapy: WFL for tasks assessed/performed Overall Cognitive Status: Impaired/Different from baseline Area of Impairment: Memory;Following commands;Awareness;Problem solving;Attention                   Current Attention Level: Alternating Memory: Decreased short-term memory;Decreased recall of precautions Following Commands: Follows multi-step commands with increased time;Follows multi-step commands consistently   Awareness: Emergent Problem  Solving: Slow processing;Requires verbal cues;Requires tactile cues General Comments: pt requires increased time to processs, follow commands, sequence        Exercises     Shoulder Instructions       General Comments husband  present, pt on 3L with cueing for PLB; noted blood on bed pad from back incision- RN notified and in room to assess    Pertinent Vitals/ Pain       Pain Assessment: Faces Faces Pain Scale: Hurts a little bit Pain Location: generalized, back Pain Descriptors / Indicators: Discomfort;Grimacing Pain Intervention(s): Limited activity within patient's tolerance;Monitored during session;Repositioned  Home Living                                          Prior Functioning/Environment              Frequency  Min 3X/week        Progress Toward Goals  OT Goals(current goals can now be found in the care plan section)  Progress towards OT goals: Progressing toward goals  Acute Rehab OT Goals Patient Stated Goal: to take her home OT Goal Formulation: With family  Plan Discharge plan remains appropriate;Frequency remains appropriate    Co-evaluation                 AM-PAC OT "6 Clicks" Daily Activity     Outcome Measure   Help from another person eating meals?: A Lot Help from another person taking care of personal grooming?: A Little Help from another person toileting, which includes using toliet, bedpan, or urinal?: A Lot Help from another person bathing (including washing, rinsing, drying)?: A Lot Help from another person to put on and taking off regular upper body clothing?: A Little Help from another person to put on and taking off regular lower body clothing?: A Lot 6 Click Score: 14    End of Session    OT Visit Diagnosis: Unsteadiness on feet (R26.81);Other abnormalities of gait and mobility (R26.89);Muscle weakness (generalized) (M62.81)   Activity Tolerance Patient limited by fatigue   Patient Left in bed;with call bell/phone within reach;with family/visitor present;with nursing/sitter in room   Nurse Communication Mobility status        Time: 5852-7782 OT Time Calculation (min): 20 min  Charges: OT General Charges $OT Visit: 1  Visit OT Treatments $Self Care/Home Management : 8-22 mins  Barry Brunner, OT Acute Rehabilitation Services Pager [phone] Office [phone]    Chancy Milroy 02/07/2021, 4:56 PM

## 2021-02-07 NOTE — Progress Notes (Signed)
This chaplain responded PMT consult for EOL spiritual care with the Pt. and Pt. husband-Ariana Wilkinson.  The chaplain was welcomed into the room for a visit.  The Pt. confidently shares her faith and the places her faith has intersected her medical care and EOL journey. The chaplain reflectively listens as Ariana Wilkinson shares the closeness of the family unit which includes one son, daughter in law, grandchildren, and two great grandchildren.  Ariana Wilkinson speaks of the strength in family while honoring the couple will celebrate 50 years together in November. The chaplain understands the Pt. is Ariana Wilkinson's partner and assists him in all parts of life except reading and driving.  The chaplain hopes to explore Ariana Wilkinson's experience of grief and offer resources if needed.  The Pt. And family accepted the chaplain's invitation for prayer and F/U spiritual care.

## 2021-02-08 DIAGNOSIS — A498 Other bacterial infections of unspecified site: Secondary | ICD-10-CM | POA: Insufficient documentation

## 2021-02-08 LAB — CBC WITH DIFFERENTIAL/PLATELET
Abs Immature Granulocytes: 0.22 10*3/uL — ABNORMAL HIGH (ref 0.00–0.07)
Basophils Absolute: 0.1 10*3/uL (ref 0.0–0.1)
Basophils Relative: 1 %
Eosinophils Absolute: 0.3 10*3/uL (ref 0.0–0.5)
Eosinophils Relative: 4 %
HCT: 24.1 % — ABNORMAL LOW (ref 36.0–46.0)
Hemoglobin: 7.3 g/dL — ABNORMAL LOW (ref 12.0–15.0)
Immature Granulocytes: 2 %
Lymphocytes Relative: 14 %
Lymphs Abs: 1.3 10*3/uL (ref 0.7–4.0)
MCH: 30.9 pg (ref 26.0–34.0)
MCHC: 30.3 g/dL (ref 30.0–36.0)
MCV: 102.1 fL — ABNORMAL HIGH (ref 80.0–100.0)
Monocytes Absolute: 1 10*3/uL (ref 0.1–1.0)
Monocytes Relative: 11 %
Neutro Abs: 6.5 10*3/uL (ref 1.7–7.7)
Neutrophils Relative %: 68 %
Platelets: 286 10*3/uL (ref 150–400)
RBC: 2.36 MIL/uL — ABNORMAL LOW (ref 3.87–5.11)
RDW: 15.9 % — ABNORMAL HIGH (ref 11.5–15.5)
WBC: 9.5 10*3/uL (ref 4.0–10.5)
nRBC: 0 % (ref 0.0–0.2)

## 2021-02-08 LAB — RENAL FUNCTION PANEL
Albumin: 2.3 g/dL — ABNORMAL LOW (ref 3.5–5.0)
Anion gap: 9 (ref 5–15)
BUN: 54 mg/dL — ABNORMAL HIGH (ref 8–23)
CO2: 22 mmol/L (ref 22–32)
Calcium: 8.1 mg/dL — ABNORMAL LOW (ref 8.9–10.3)
Chloride: 106 mmol/L (ref 98–111)
Creatinine, Ser: 5.03 mg/dL — ABNORMAL HIGH (ref 0.44–1.00)
GFR, Estimated: 9 mL/min — ABNORMAL LOW (ref >60)
Glucose, Bld: 179 mg/dL — ABNORMAL HIGH (ref 70–99)
Phosphorus: 5 mg/dL — ABNORMAL HIGH (ref 2.5–4.6)
Potassium: 3.6 mmol/L (ref 3.5–5.1)
Sodium: 137 mmol/L (ref 135–145)

## 2021-02-08 LAB — GLUCOSE, CAPILLARY
Glucose-Capillary: 179 mg/dL — ABNORMAL HIGH (ref 70–99)
Glucose-Capillary: 328 mg/dL — ABNORMAL HIGH (ref 70–99)

## 2021-02-08 LAB — MAGNESIUM: Magnesium: 1.6 mg/dL — ABNORMAL LOW (ref 1.7–2.4)

## 2021-02-08 MED ORDER — HEPARIN SOD (PORK) LOCK FLUSH 100 UNIT/ML IV SOLN
250.0000 [IU] | INTRAVENOUS | Status: AC | PRN
  Administered 2021-02-08: 250 [IU]
  Filled 2021-02-08: qty 2.5

## 2021-02-08 MED ORDER — CEFEPIME IV (FOR PTA / DISCHARGE USE ONLY): 1.0000 g | INTRAVENOUS | 0 refills | Status: AC

## 2021-02-08 MED ORDER — FUROSEMIDE 40 MG PO TABS: 40.0000 mg | ORAL_TABLET | Freq: Every day | ORAL | 0 refills | Status: AC

## 2021-02-08 NOTE — Progress Notes (Signed)
VAST team RN removed PICC per order. Manual pressure held to achieve hemostasis, then pressure bandage applied. No complications.

## 2021-02-08 NOTE — Progress Notes (Signed)
PROGRESS NOTE    Ariana Wilkinson  ZOX:[id number] DOB: 1951/07/06 DOA: 01/24/2021 PCP: Barbette Reichmann, MD    No chief complaint on file.   Brief Narrative:  70 year old with prior history of type I diabetes mellitus on insulin pump, peripheral neuropathy, hypertension, underwent lumbar fusion surgery on 12/26/2020 admitted on 01/11/2021 at [facility] for postop wound abscesses, growing Pseudomonas was started on IV cefepime and discharged on 01/16/2021 to complete a 4-week course, presented back to ED on 01/23/2021 with AKI and CT of the abdomen pelvis showing postrenal obstruction and distended bladder.  Patient was transferred to [facility] for AKI and acute metabolic encephalopathy. Patient's AKI felt to be multifactorial secondary to obstruction, acute illness and UTI.  Nephrology consulted and recommendations given unfortunately patient's creatinine has not improved much in the last 1 week. On 02/03/21 patient had an episode of acute respiratory failure with hypoxia secondary to pulmonary edema. Currently she is on Friendly oxygen to keep sats greater than 90%.  In view of her worsening creatinine , poor progression, palliative care consulted for goals of care, after futher discussions with family and patient, a decision was made to transition to comfort measures once discharged home with hospice.   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 Creatinine peaked to 5.05, has a baseline creatinine of 1.2-1.4. Ultrasound renal with  chronic findings without any evidence of hydronephrosis. Patient and family at this time does not want dialysis. Nephrology on board and appreciate recommendations. Overnight patient had respiratory issues and chest x-ray showing pulm edema requiring a dose of IV lasix.  Creatinine worsened to 5, remained stable around 5.    Acute metabolic encephalopathy; Appears to have resolved patient is alert and answering questions. She appears to be back to baseline.   Type 1 diabetes mellitus with neuropathy Insulin-dependent and well controlled. CBG (last 3)  Recent Labs    02/07/21 1643 02/07/21 2118 02/08/21 0636  GLUCAP 226* 244* 179*    Decreased the dose of levemir and SSI.  No changes in meds.     Essential hypertension Well controlled.     Hypothyroidism Continue with Synthroid.   Lumbar fusion surgery Abscess growing Pseudomonas. Complete the course of cefepime till 02/15/2021.    Hyponatremia;  Probably from fluid overload.  Resolved.   Anemia of chronic disease:  Sec to recent surgery, acute on chronic illness. S/p 1 unit of prbc transfusion.  Transfuse to keep hemoglobin greater than 7.   Leukocytosis:  Resolved.    Protein calorie Malnutrition Dietary will be consulted.  In view of multiple medical problems, co morbidities, worsening renal function, clinical deterioration, palliative care consulted for goals of care, and she was transitioned to comfort measures and hospice at home on discharge.    DVT prophylaxis: scd's Code Status: DNR  Family Communication: Husband.  at bedside. Disposition:   Status is: Inpatient  Remains inpatient appropriate because:Ongoing diagnostic testing needed not appropriate for outpatient work up and IV treatments appropriate due to intensity of illness or inability to take PO  Dispo:  Patient From: Home  Planned Disposition: Home  Medically stable for discharge: No         Consultants:   Nephrology Palliative care.   Procedures: (none.   Antimicrobials:  Antibiotics Given (last 72 hours)     Date/Time Action Medication Dose Rate   02/05/21 0931 New Bag/Given   ceFEPIme (MAXIPIME) 1 g in sodium chloride 0.9 % 100 mL IVPB 1 g 200 mL/hr   02/06/21 0906 New Bag/Given   ceFEPIme (MAXIPIME) 1 g in sodium chloride 0.9 % 100 mL IVPB 1 g 200 mL/hr   02/07/21 1029 New Bag/Given   ceFEPIme (MAXIPIME) 1 g in sodium chloride 0.9 % 100 mL IVPB 1 g 200 mL/hr         Subjective: No new complaints at this time.   Objective: Vitals:   02/07/21 1842 02/07/21 2145 02/08/21 0450 02/08/21 0916  BP: (!) 146/45 139/61 (!) 116/39 (!) 126/44  Pulse: 79 72 69 74  Resp: 18 20 17 16   Temp: 98.4 F (36.9 C) 98.3 F (36.8 C) 98.3 F (36.8 C) 98.4 F (36.9 C)  TempSrc:  Oral  Oral  SpO2: 97% 99% 99% 100%  Weight:  61.2 kg    Height:        Intake/Output Summary (Last 24 hours) at 02/08/2021 0926 Last data filed at 02/08/2021 0450 Gross per 24 hour  Intake 1138.11 ml  Output 1325 ml  Net -186.89 ml    Filed Weights   02/05/21 2138 02/07/21 0746 02/07/21 2145  Weight: 67.2 kg 61.2 kg 61.2 kg    Examination:  General exam: Elderly woman not in distress.  Respiratory system: diminished air entry at bases, tachypnea on talking.  Cardiovascular system: S1S2 HEARD, RRR, no JVD,  Gastrointestinal system: Abdomen is soft, non tender non distended bowel sounds okay.  Central nervous system: Alert and oriented. Blind, very hard of hearing, bed bound.  Extremities: pedal edema trace.  Skin: No rashes seen.  Psychiatry: Mood is appropriate.     Data Reviewed: I have personally reviewed following labs and imaging studies  CBC: Recent Labs  Lab 02/04/21 0222 02/05/21 0319 02/06/21 0404 02/07/21 0329 02/08/21 0438  WBC 14.0* 10.1 9.7 9.6 9.5  NEUTROABS 9.4* 6.3 6.6 6.5 6.5  HGB 9.4* 8.3* 7.8* 7.5* 7.3*  HCT 28.6* 26.4* 25.3* 23.8* 24.1*  MCV 95.7 100.0 99.6 100.0  102.1*  PLT 164 193 229 254 286     Basic Metabolic Panel: Recent Labs  Lab 02/04/21 0222 02/05/21 0319 02/06/21 0404 02/06/21 0409 02/07/21 0329 02/08/21 0438  NA 131* 133*  --  132* 136 137  K 3.7 3.7  --  3.6 3.6 3.6  CL 100 102  --  104 107 106  CO2 21* 21*  --  21* 21* 22  GLUCOSE 137* 111*  --  107* 102* 179*  BUN 54* 54*  --  55* 53* 54*  CREATININE 4.37* 4.59*  --  4.85* 5.06* 5.03*  CALCIUM 7.9* 7.8*  --  7.6* 7.9* 8.1*  MG 1.6* 1.7 1.7  --  1.7 1.6*  PHOS 3.7 4.2  4.2  --  4.4 4.7* 5.0*     GFR: Estimated Creatinine Clearance: 8.4 mL/min (A) (by C-G formula based on SCr of 5.03 mg/dL (H)).  Liver Function Tests: Recent Labs  Lab 02/04/21 0222 02/05/21 0319 02/06/21 0409 02/07/21 0329 02/08/21 0438  ALBUMIN 1.5* 1.6* 1.7* 2.8* 2.3*     CBG: Recent Labs  Lab 02/07/21 0804 02/07/21 1109 02/07/21  1643 02/07/21 2118 02/08/21 0636  GLUCAP 95 170* 226* 244* 179*      No results found for this or any previous visit (from the past 240 hour(s)).       Radiology Studies: ECHOCARDIOGRAM COMPLETE  Result Date: 02/06/2021    ECHOCARDIOGRAM REPORT   Patient Name:   ADRIANAH PROPHETE Date of Exam: 02/06/2021 Medical Rec #:  [id number]     Height:       59.0 in Accession #:    [id number]    Weight:       148.1 lb Date of Birth:  [birth year]    BSA:          1.624 m Patient Age:    69 years      BP:           122/47 mmHg Patient Gender: F             HR:           69 bpm. Exam Location:  Inpatient Procedure: 2D Echo, Cardiac Doppler and Color Doppler                               MODIFIED REPORT: This report was modified by Weston Brass MD on 02/06/2021 due to revision.  Indications:     Acute resp. distress  History:         Patient has no prior history of Echocardiogram examinations.                  Signs/Symptoms:AKI, pul.edema, resp. failure; Risk                  Factors:Diabetes and Hypertension.  Sonographer:     Lavenia Atlas Referring Phys:  Herminio Heads Diagnosing Phys: Weston Brass MD IMPRESSIONS  1. Left ventricular ejection fraction, by estimation, is 70 to 75%. The left ventricle has hyperdynamic function. The left ventricle has no regional wall motion abnormalities. Left ventricular diastolic parameters are consistent with Grade I diastolic dysfunction (impaired relaxation).  2. Right ventricular systolic function is normal. The right ventricular size is mildly enlarged. There is moderately elevated pulmonary artery systolic pressure. The estimated right ventricular systolic pressure is 49.8 mmHg.  3. A small pericardial effusion is present. The pericardial effusion is circumferential. There is no evidence of cardiac tamponade.  4. The mitral valve is grossly normal. Trivial mitral valve regurgitation. No evidence of mitral stenosis.  5. The aortic valve is grossly normal. Aortic valve regurgitation is not visualized. No aortic stenosis is present.  6. The inferior vena cava is dilated in size with <50% respiratory variability, suggesting right atrial pressure of 15 mmHg. FINDINGS  Left Ventricle: Left ventricular ejection fraction, by estimation, is 70 to 75%. The left ventricle has hyperdynamic function. The left ventricle has no regional wall motion abnormalities. The left ventricular internal cavity size was normal in size. There is no left ventricular hypertrophy. Left ventricular diastolic parameters are consistent with Grade I diastolic dysfunction (impaired relaxation). Right Ventricle: The right ventricular size is mildly enlarged. No increase in right ventricular wall thickness. Right ventricular systolic function is normal. There is moderately elevated pulmonary artery systolic pressure. The tricuspid regurgitant velocity is 2.95 m/s, and with an assumed right atrial pressure of 15 mmHg, the estimated right ventricular systolic pressure is 49.8 mmHg. Left Atrium: Left atrial size was normal in size. Right Atrium: Right atrial size  was normal in  size. Pericardium: A small pericardial effusion is present. The pericardial effusion is circumferential. There is no evidence of cardiac tamponade. Mitral Valve: The mitral valve is grossly normal. Mild mitral annular calcification. Trivial mitral valve regurgitation. No evidence of mitral valve stenosis. Tricuspid Valve: The tricuspid valve is normal in structure. Tricuspid valve regurgitation is mild . No evidence of tricuspid stenosis. Aortic Valve: The aortic valve is grossly normal. Aortic valve regurgitation is not visualized. No aortic stenosis is present. Aortic valve mean gradient measures 7.0 mmHg. Aortic valve peak gradient measures 11.4 mmHg. Aortic valve area, by VTI measures  1.61 cm. Pulmonic Valve: The pulmonic valve was normal in structure. Pulmonic valve regurgitation is mild. No evidence of pulmonic stenosis. Aorta: The aortic root is normal in size and structure. Venous: The inferior vena cava is dilated in size with less than 50% respiratory variability, suggesting right atrial pressure of 15 mmHg. IAS/Shunts: No atrial level shunt detected by color flow Doppler. Additional Comments: A venous catheter is visualized in the right atrium.  LEFT VENTRICLE PLAX 2D LVIDd:         4.00 cm  Diastology LVIDs:         2.20 cm  LV e' medial:    5.98 cm/s LV PW:         0.90 cm  LV E/e' medial:  18.9 LV IVS:        0.90 cm  LV e' lateral:   8.27 cm/s LVOT diam:     1.80 cm  LV E/e' lateral: 13.7 LV SV:         69 LV SV Index:   42 LVOT Area:     2.54 cm  RIGHT VENTRICLE RV Basal diam:  2.60 cm RV S prime:     14.70 cm/s TAPSE (M-mode): 1.9 cm LEFT ATRIUM             Index       RIGHT ATRIUM          Index LA diam:        3.40 cm 2.09 cm/m  RA Area:     9.61 cm LA Vol (A2C):   28.0 ml 17.25 ml/m RA Volume:   19.00 ml 11.70 ml/m LA Vol (A4C):   28.0 ml 17.25 ml/m LA Biplane Vol: 29.1 ml 17.92 ml/m  AORTIC VALVE AV Area (Vmax):    1.66 cm AV Area (Vmean):   1.60 cm AV Area (VTI):      1.61 cm AV Vmax:           168.50 cm/s AV Vmean:          125.000 cm/s AV VTI:            0.426 m AV Peak Grad:      11.4 mmHg AV Mean Grad:      7.0 mmHg LVOT Vmax:         110.00 cm/s LVOT Vmean:        78.600 cm/s LVOT VTI:          0.270 m LVOT/AV VTI ratio: 0.63  AORTA Ao Root diam: 2.40 cm MITRAL VALVE                TRICUSPID VALVE MV Area (PHT): 5.16 cm     TR Peak grad:   34.8 mmHg MV Decel Time: 147 msec     TR Vmax:        295.00 cm/s MV E velocity: 113.00 cm/s MV A velocity: 119.00  cm/s  SHUNTS MV E/A ratio:  0.95         Systemic VTI:  0.27 m                             Systemic Diam: 1.80 cm Weston Brass MD Electronically signed by Weston Brass MD Signature Date/Time: 02/06/2021/5:19:59 PM    Final (Updated)         Scheduled Meds:  amLODipine  5 mg Oral Daily   carvedilol  25 mg Oral BID   Chlorhexidine Gluconate Cloth  6 each Topical Daily   furosemide  40 mg Oral Daily   insulin aspart  0-5 Units Subcutaneous QHS   insulin aspart  0-6 Units Subcutaneous TID WC   insulin detemir  6 Units Subcutaneous QHS   levothyroxine  125 mcg Oral Q0600   ondansetron (ZOFRAN) IV  4 mg Intravenous Q6H   pantoprazole  40 mg Oral Daily   Continuous Infusions:  ceFEPime (MAXIPIME) IV 1 g (02/07/21 1029)   promethazine (PHENERGAN) injection (IM or IVPB) 6.25 mg (02/03/21 1653)     LOS: 15 days       Kathlen Mody, MD [facility]   To contact the attending provider between 7A-7P or the covering provider during after hours 7P-7A, please log into the web site [email or web address] and access using universal [place] password for that web site. If you do not have the password, please call the hospital operator.  02/08/2021, 9:26 AM

## 2021-02-08 NOTE — Progress Notes (Addendum)
This chaplain is present for F/U spiritual care. The Pt. husband-Tony is at the Pt. bedside. The Pt. is sitting up in the bedside recliner preparing to eat lunch.  The Pt. describes herself as "feeling better than she has in the last few days."  The chaplain listened to the family's plans for preparing the Pt. home for d/c with Hospice. The chaplain understands from the Pt., if Pt. care becomes overwhelming for the family, she prefers residential hospice care.  At this time the Pt. and Alinda Money are celebrating God's glory and family support.   The Pt. and family accepted the chaplain's invitation for prayer before lunch.

## 2021-02-08 NOTE — Plan of Care (Signed)
°  Problem: Coping: °Goal: Level of anxiety will decrease °Outcome: Progressing °  °

## 2021-02-08 NOTE — Discharge Summary (Signed)
Physician Discharge Summary  LETTY SALVI YQI:[id number] DOB: [birth year] DOA: 01/24/2021  PCP: Lysbeth Penner, MD  Admit date: 01/24/2021 Discharge date: 02/08/2021  Admitted From: Home.  Disposition:  Home hospice.   Recommendations for Outpatient Follow-up:  Follow up with hospice MD as recommended.    Equipment/Devices:oxygen.   Discharge Condition:Hospice.  CODE STATUS:comfort care.  Diet recommendation: Heart Healthy / Carb Modified /  Brief/Interim Summary:  70 year old with prior history of type I diabetes mellitus on insulin pump, peripheral neuropathy, hypertension, underwent lumbar fusion surgery on 12/26/2020 admitted on 01/11/2021 at [facility] for postop wound abscesses, growing Pseudomonas was started on IV cefepime and discharged on 01/16/2021 to complete a 4-week course, presented back to ED on 01/23/2021 with AKI and CT of the abdomen pelvis showing postrenal obstruction and distended bladder.  Patient was transferred to [facility] for AKI and acute metabolic encephalopathy. Patient's AKI felt to be multifactorial secondary to obstruction, acute illness and UTI.  Nephrology consulted and recommendations given unfortunately patient's creatinine has not improved much in the last 1 week. On 02/03/21 patient had an episode of acute respiratory failure with hypoxia secondary to pulmonary edema. Currently she is on [place] oxygen to keep sats greater than 90%. In view of her worsening creatinine , poor progression, palliative care consulted for goals of care, after futher discussions with family and patient, a decision was made to transition to comfort measures once discharged home with hospice.   Discharge Diagnoses:  Principal Problem:   AKI (acute kidney injury) ([place]) Active Problems:   Acute urinary retention   Metabolic acidosis, normal anion gap (NAG)   Acute metabolic encephalopathy   Overweight (BMI 25.0-29.9)   Diabetes mellitus with peripheral autonomic  neuropathy (HCC)   Essential hypertension   Unspecified protein-calorie malnutrition ([place])   Hypothyroidism   Acute lower UTI   Pseudomonas infection   AKI probably secondary to combination of acute illness from infection, postrenal obstruction and UTI. Creatinine peaked to 5.05, has a baseline creatinine of 1.2-1.4. Ultrasound renal with chronic findings without any evidence of hydronephrosis. Patient and family at this time does not want dialysis. Nephrology on board and appreciate recommendations. Overnight patient had respiratory issues and chest x-ray showing pulm edema requiring a dose of IV lasix. Creatinine worsened to 5, remained stable around 5.      Acute metabolic encephalopathy; Appears to have resolved patient is alert and answering questions. She appears to be back to baseline.    Type 1 diabetes mellitus with neuropathy Insulin-dependent and well controlled. Resume home insulin regimen.      Essential hypertension Well controlled.       Hypothyroidism Continue with Synthroid.     Lumbar fusion surgery Abscess growing Pseudomonas. Complete the course of cefepime till 02/20/2021.       Hyponatremia; Probably from fluid overload. Resolved.   Anemia of chronic disease: Sec to recent surgery, acute on chronic illness. S/p 1 unit of prbc transfusion. Transfuse to keep hemoglobin greater than 7.     Leukocytosis: Resolved.     Protein calorie Malnutrition Dietary will be consulted.   In view of multiple medical problems, co morbidities, worsening renal function, clinical deterioration, palliative care consulted for goals of care, and she was transitioned to comfort measures and hospice at home on discharge.      Discharge Instructions  Discharge Instructions     [facility] pharmacist to adjust dose for Vancomycin, Aminoglycosides and other anti-infective therapies as requested by physician.  Complete by: As directed    [facility] to provide Cath Flo 643m   Complete by: As directed    Administer for PICC line occlusion and as ordered by physician for other access device issues.   Anaphylaxis Kit: Provided to treat any anaphylactic reaction to the medication being provided to the patient if First Dose or when requested by physician   Complete by: As directed    Epinephrine 146mml vial / amp: Administer 0.43m72m0.43ml4mubcutaneously once for moderate to severe anaphylaxis, nurse to call physician and pharmacy when reaction occurs and call 911 if needed for immediate care   Diphenhydramine 50mg743mIV vial: Administer 25-50mg 243mM PRN for first dose reaction, rash, itching, mild reaction, nurse to call physician and pharmacy when reaction occurs   Sodium Chloride 0.9% NS 500ml [id number] if needed for hypovolemic blood pressure drop or as ordered by physician after call to physician with anaphylactic reaction   Change dressing on IV access line weekly and PRN   Complete by: As directed    Diet - low sodium heart healthy   Complete by: As directed    Discharge instructions   Complete by: As directed    Please follow up with Hospice MD.   Flush IV access with Sodium Chloride 0.9% and Heparin 10 units/ml or 100 units/ml   Complete by: As directed    Home infusion instructions - [facility]   Complete by: As directed    Instructions: Flush IV access with Sodium Chloride 0.9% and Heparin 10units/ml or 100units/ml   Change dressing on IV access line: Weekly and PRN   Instructions Cath Flo 2mg: Ad743mister for PICC Line occlusion and as ordered by physician for other access device   [facility] pharmacist to adjust dose for: Vancomycin, Aminoglycosides and other anti-infective therapies as requested by physician   Increase activity slowly   Complete by: As directed    Method of administration may be changed at the discretion of home infusion pharmacist based upon assessment of the patient  and/or caregiver's ability to self-administer the medication ordered   Complete by: As directed    No wound care   Complete by: As directed       Allergies as of 02/08/2021       Reactions   Ace Inhibitors Cough   Diazepam Anxiety   Metoclopramide Other (See Comments)   Can not function   Naproxen Swelling   Nitrofurantoin Shortness Of Breath, Other (See Comments)   Hallucination   Olmesartan Other (See Comments)   Hyperkalemia; Ran up her potassium   Alendronate Sodium Other (See Comments)   acid reflux   Erythromycin Base Other (See Comments)   Hydrochlorothiazide Other (See Comments)   Was hurting in her chest and throwing up   Povidone-iodine Itching        Medication List     STOP taking these medications    alendronate 70 MG tablet Commonly known as: FOSAMAX   atorvastatin 80 MG tablet Commonly known as: LIPITOR   ceFEPIme 2 g injection Commonly known as: MAXIPIME Replaced by: ceFEPime  IVPB   cyanocobalamin 1000 MCG/ML injection Commonly known as: (VITAMIN B-12)   sulfaSALAzine 500 MG tablet Commonly known as: AZULFIDINE       TAKE these medications    amLODipine 5 MG tablet Commonly known as: NORVASC Take 5 mg by mouth daily.   Baqsimi One Pack 3 MG/DOSE Powd Generic drug: Glucagon Place 1 spray into the nose  See admin instructions. INHALE 1 SPRAY INTO SINGLE NOSTRIL. IF NO RESPONSE, MAY REPEAT IN 15 MINUTES USING A NEW DEVICE. USE AS NEEDED FOR LOW BLOOD SUGAR REACTION.   Calcium Carb-Cholecalciferol 600-200 MG-UNIT Tabs Take 1 tablet by mouth daily.   carvedilol 25 MG tablet Commonly known as: COREG Take 25 mg by mouth 2 (two) times daily.   ceFEPime  IVPB Commonly known as: MAXIPIME Inject 1 g into the vein daily for 16 days. Indication:  Post-op abscess First Dose: Yes Last Day of Therapy:  02/20/21 Labs - Once weekly:  CBC/D and BMP, Labs - Every other week:  ESR and CRP Method of administration: IV Push Method of administration  may be changed at the discretion of home infusion pharmacist based upon assessment of the patient and/or caregiver's ability to self-administer the medication ordered. Replaces: ceFEPIme 2 g injection   diclofenac Sodium 1 % Gel Commonly known as: VOLTAREN Apply 1 g topically as needed for pain.   fexofenadine 180 MG tablet Commonly known as: ALLEGRA Take 180 mg by mouth as needed for allergies.   furosemide 40 MG tablet Commonly known as: LASIX Take 1 tablet (40 mg total) by mouth daily. Start taking on: February 09, 2021 What changed:  when to take this reasons to take this   gabapentin 300 MG capsule Commonly known as: NEURONTIN Take 300 mg by mouth 3 (three) times daily.   Glucagon Emergency 1 MG Kit Inject 1 mg into the skin as directed.   guanFACINE 1 MG tablet Commonly known as: TENEX Take 1 mg by mouth at bedtime.   insulin lispro 100 UNIT/ML injection Commonly known as: HUMALOG Inject 40 Units into the skin continuous. Via pump.   labetalol 100 MG tablet Commonly known as: NORMODYNE Take 100 mg by mouth daily as needed (TAKE ONE TABLET EVERY DAY ONLY AS NEEDED FOR SYSTOLIC BP >176).   omeprazole 20 MG capsule Commonly known as: PRILOSEC Take 20 mg by mouth as needed (acid reflux).   ondansetron 4 MG tablet Commonly known as: ZOFRAN Take 4 mg by mouth every 6 (six) hours as needed for nausea/vomiting.   Synthroid 100 MCG tablet Generic drug: levothyroxine Take 100 mcg by mouth every morning.   traMADol 50 MG tablet Commonly known as: ULTRAM Take 50 mg by mouth as needed for pain.               Discharge Care Instructions  (From admission, onward)           Start     Ordered   02/08/21 0000  Change dressing on IV access line weekly and PRN  (Home infusion instructions - [facility] )        02/08/21 [facility] [place] Follow up.   Specialty: Home Health Services Why:  for continuation of home health services. Contact information: [address] [place] [address] [phone]                Allergies  Allergen Reactions   Ace Inhibitors Cough   Diazepam Anxiety   Metoclopramide Other (See Comments)    Can not function   Naproxen Swelling   Nitrofurantoin Shortness Of Breath and Other (See Comments)    Hallucination   Olmesartan Other (See Comments)    Hyperkalemia; Ran up her potassium     Alendronate Sodium Other (See Comments)  acid reflux   Erythromycin Base Other (See Comments)   Hydrochlorothiazide Other (See Comments)    Was hurting in her chest and throwing up   Povidone-Iodine Itching    Consultations: Nephrology.    Procedures/Studies: DG Chest 2 View  Result Date: 02/01/2021 CLINICAL DATA:  Pneumonia EXAM: CHEST - 2 VIEW COMPARISON:  01/25/2021 FINDINGS: The heart size and mediastinal contours are within normal limits. Left upper extremity PICC. New small bilateral pleural effusions. Similar mild, diffuse interstitial opacity. The visualized skeletal structures are unremarkable. IMPRESSION: New small bilateral pleural effusions. Similar mild, diffuse interstitial opacity, likely edema. Electronically Signed   By: Eddie Candle M.D.   On: 02/01/2021 15:17   MR BRAIN WO CONTRAST  Result Date: 01/25/2021 CLINICAL DATA:  Encephalopathy.  Possible stroke. EXAM: MRI HEAD WITHOUT CONTRAST TECHNIQUE: Multiplanar, multiecho pulse sequences of the brain and surrounding structures were obtained without intravenous contrast. COMPARISON:  None. FINDINGS: Brain: No acute infarct, mass effect or extra-axial collection. No acute or chronic hemorrhage. Normal white matter signal. Generalized volume loss without a clear lobar predilection. The midline structures are normal. Vascular: Major flow voids are preserved. Skull and upper cervical spine: Normal calvarium and skull base. Visualized upper cervical spine and soft tissues are normal.  Sinuses/Orbits:Binocular volume loss and sequelae of hemorrhage. Sinuses are clear. IMPRESSION: 1. No acute intracranial abnormality. 2. Generalized volume loss without a clear lobar predilection. Electronically Signed   By: Ulyses Jarred M.D.   On: 01/25/2021 20:19   MR LUMBAR SPINE WO CONTRAST  Result Date: 01/25/2021 CLINICAL DATA:  Encephalopathy. History of L5-S1 spinal fusion on 12/26/2020 with subsequent infection and washout on 01/11/2021 EXAM: MRI LUMBAR SPINE WITHOUT CONTRAST TECHNIQUE: Multiplanar, multisequence MR imaging of the lumbar spine was performed. No intravenous contrast was administered. COMPARISON:  MRI 12/24/2020 FINDINGS: Technical note: Despite efforts by the technologist and patient, motion artifact is present on today's exam and could not be eliminated. Artifact primarily affects the axial T2 weighted sequence. This reduces exam sensitivity and specificity. Segmentation: Standard. Alignment:  Mild dextrocurvature.  No significant listhesis. Vertebrae: Interval postoperative changes of L5-S1 posterior and interbody fusion. No fracture. No evidence to suggest discitis. No suspicious bone lesion. Multilevel discogenic endplate marrow changes. Conus medullaris and cauda equina: Conus extends to the L1-2 level. There is bunching of the cauda equina nerve roots above the L3-4 level of stenosis. No appreciable epidural fluid collection, although evaluation is somewhat degraded by motion artifact and lack of postcontrast imaging. Paraspinal and other soft tissues: Interval postoperative changes within the lower back. No postoperative fluid collections. Disc levels: T12-L1: Mild diffuse disc bulge without foraminal or canal stenosis. Unchanged. L1-L2: Mild diffuse disc bulge with small biforaminal protrusions. Previously seen right subarticular disc herniation is no longer evident. There is mild canal stenosis and mild bilateral foraminal recess stenosis which are unchanged. L2-L3: Diffuse  disc osteophyte complex resulting in mild canal stenosis and mild bilateral foraminal stenosis. Unchanged. L3-L4: Diffuse disc osteophyte complex with mild bilateral facet arthropathy and prominence of the epidural fat. Findings result in moderate canal stenosis with mild bilateral foraminal stenosis. Unchanged. L4-L5: Diffuse disc bulge with mild bilateral facet arthropathy and ligamentum flavum buckling. Prominence of the epidural fat. Findings result in moderate canal stenosis with mild left foraminal stenosis. Unchanged. L5-S1: Interval posterior and interbody fusion. Left paracentral disc protrusion and bilateral facet hypertrophy. Suspect mild bilateral foraminal stenosis. No significant canal stenosis. The degree of foraminal narrowing on the left may be slightly improved from prior.  IMPRESSION: 1. Interval postoperative changes of L5-S1 posterior and interbody fusion. No appreciable epidural fluid collection, although evaluation is somewhat degraded by motion artifact and lack of postcontrast imaging. 2. Multilevel degenerative changes of the lumbar spine are similar to the prior study. There is moderate canal stenosis at L3-4 and L4-5 and mild canal stenosis at L1-2 and L2-3. Electronically Signed   By: Davina Poke D.O.   On: 01/25/2021 20:26   US RENAL  Result Date: 01/25/2021 CLINICAL DATA:  Acute renal failure EXAM: RENAL / URINARY TRACT ULTRASOUND COMPLETE COMPARISON:  01/24/2021 FINDINGS: Right Kidney: Renal measurements: 8.9 x 3.5 x 4.8 cm = volume: 176 mL. Cortex is echogenic. No mass or hydronephrosis. Trace perinephric fluid. Left Kidney: Renal measurements: 7.7 x 4 x 4.1 cm = volume: 66.5 mL. Cortex is echogenic. No mass or hydronephrosis. Bladder: Decompressed by Foley catheter. Other: Small free fluid in the right lower quadrant. Trace right pleural effusion IMPRESSION: 1. Echogenic kidneys consistent with medical renal disease. No hydronephrosis 2. Small free fluid in the right lower  quadrant. Trace right pleural effusion. Electronically Signed   By: Donavan Foil M.D.   On: 01/25/2021 17:20   DG CHEST PORT 1 VIEW  Result Date: 02/05/2021 CLINICAL DATA:  Shortness of breath. EXAM: PORTABLE CHEST 1 VIEW COMPARISON:  February 03, 2021. FINDINGS: Stable cardiomediastinal silhouette. Left-sided PICC line is unchanged in position. Bilateral perihilar and basilar opacities are noted concerning for edema or atelectasis. Small bilateral pleural effusions are noted. No pneumothorax is noted. Bony thorax is unremarkable. IMPRESSION: Bilateral perihilar and basilar opacities are noted concerning for edema or atelectasis. Small pleural effusions. Electronically Signed   By: Marijo Conception M.D.   On: 02/05/2021 13:24   DG Chest Port 1 View  Result Date: 02/03/2021 CLINICAL DATA:  Acute respiratory distress EXAM: PORTABLE CHEST 1 VIEW COMPARISON:  02/01/2021 FINDINGS: Left PICC line remains in place with the tip in the upper right atrium. Small left pleural effusion with left lower lobe infiltrate, both improving since prior study. Small right pleural effusion. No confluent opacity on the right. Heart is normal size. IMPRESSION: Small left pleural effusion with left lower lobe infiltrate, both improved slightly since prior study. Small right pleural effusion. Electronically Signed   By: Rolm Baptise M.D.   On: 02/03/2021 20:39   DG CHEST PORT 1 VIEW  Result Date: 01/25/2021 CLINICAL DATA:  Central line placement EXAM: PORTABLE CHEST 1 VIEW COMPARISON:  CT and radiograph 01/24/2021 FINDINGS: Left upper extremity PICC tip terminates at the level of the right atrium. No pneumothorax or effusion. Atelectatic changes in both lungs including more bandlike density in the left lower lung. No pneumothorax. No effusion. Stable cardiomediastinal contours accounting for differences technique. Degenerative changes are present in the imaged spine and shoulders. No acute osseous or soft tissue abnormality.  IMPRESSION: Left upper extremity PICC tip terminates at the right atrium. Persistent bandlike opacities, likely subsegmental atelectasis and/or scarring. Electronically Signed   By: Lovena Le M.D.   On: 01/25/2021 01:43   DG Abd Portable 1V  Result Date: 01/31/2021 CLINICAL DATA:  Possible ileus EXAM: PORTABLE ABDOMEN - 1 VIEW COMPARISON:  01/24/2021 FINDINGS: Scattered large and small bowel gas is noted. No dilatation is identified to suggest obstructive change ileus. No free air is noted. Postsurgical changes and degenerative changes in the lumbar spine are noted. IMPRESSION: No acute abnormality noted. Electronically Signed   By: Inez Catalina M.D.   On: 01/31/2021 17:46   ECHOCARDIOGRAM  COMPLETE  Result Date: 02/06/2021    ECHOCARDIOGRAM REPORT   Patient Name:   Ariana SCHARTZ Date of Exam: 02/06/2021 Medical Rec #:  [id number]     Height:       59.0 in Accession #:    [id number]    Weight:       148.1 lb Date of Birth:  1951-04-15    BSA:          1.624 m Patient Age:    70 years      BP:           122/47 mmHg Patient Gender: F             HR:           69 bpm. Exam Location:  Inpatient Procedure: 2D Echo, Cardiac Doppler and Color Doppler                               MODIFIED REPORT: This report was modified by Cherlynn Kaiser MD on 02/06/2021 due to revision.  Indications:     Acute resp. distress  History:         Patient has no prior history of Echocardiogram examinations.                  Signs/Symptoms:AKI, pul.edema, resp. failure; Risk                  Factors:Diabetes and Hypertension.  Sonographer:     Dustin Flock Referring Phys:  Theola Sequin Diagnosing Phys: Cherlynn Kaiser MD IMPRESSIONS  1. Left ventricular ejection fraction, by estimation, is 70 to 75%. The left ventricle has hyperdynamic function. The left ventricle has no regional wall motion abnormalities. Left ventricular diastolic parameters are consistent with Grade I diastolic dysfunction (impaired relaxation).  2. Right  ventricular systolic function is normal. The right ventricular size is mildly enlarged. There is moderately elevated pulmonary artery systolic pressure. The estimated right ventricular systolic pressure is 29.5 mmHg.  3. A small pericardial effusion is present. The pericardial effusion is circumferential. There is no evidence of cardiac tamponade.  4. The mitral valve is grossly normal. Trivial mitral valve regurgitation. No evidence of mitral stenosis.  5. The aortic valve is grossly normal. Aortic valve regurgitation is not visualized. No aortic stenosis is present.  6. The inferior vena cava is dilated in size with <50% respiratory variability, suggesting right atrial pressure of 15 mmHg. FINDINGS  Left Ventricle: Left ventricular ejection fraction, by estimation, is 70 to 75%. The left ventricle has hyperdynamic function. The left ventricle has no regional wall motion abnormalities. The left ventricular internal cavity size was normal in size. There is no left ventricular hypertrophy. Left ventricular diastolic parameters are consistent with Grade I diastolic dysfunction (impaired relaxation). Right Ventricle: The right ventricular size is mildly enlarged. No increase in right ventricular wall thickness. Right ventricular systolic function is normal. There is moderately elevated pulmonary artery systolic pressure. The tricuspid regurgitant velocity is 2.95 m/s, and with an assumed right atrial pressure of 15 mmHg, the estimated right ventricular systolic pressure is 18.8 mmHg. Left Atrium: Left atrial size was normal in size. Right Atrium: Right atrial size was normal in size. Pericardium: A small pericardial effusion is present. The pericardial effusion is circumferential. There is no evidence of cardiac tamponade. Mitral Valve: The mitral valve is grossly normal. Mild mitral annular calcification. Trivial mitral valve regurgitation. No evidence of mitral valve  stenosis. Tricuspid Valve: The tricuspid valve is  normal in structure. Tricuspid valve regurgitation is mild . No evidence of tricuspid stenosis. Aortic Valve: The aortic valve is grossly normal. Aortic valve regurgitation is not visualized. No aortic stenosis is present. Aortic valve mean gradient measures 7.0 mmHg. Aortic valve peak gradient measures 11.4 mmHg. Aortic valve area, by VTI measures  1.61 cm. Pulmonic Valve: The pulmonic valve was normal in structure. Pulmonic valve regurgitation is mild. No evidence of pulmonic stenosis. Aorta: The aortic root is normal in size and structure. Venous: The inferior vena cava is dilated in size with less than 50% respiratory variability, suggesting right atrial pressure of 15 mmHg. IAS/Shunts: No atrial level shunt detected by color flow Doppler. Additional Comments: A venous catheter is visualized in the right atrium.  LEFT VENTRICLE PLAX 2D LVIDd:         4.00 cm  Diastology LVIDs:         2.20 cm  LV e' medial:    5.98 cm/s LV PW:         0.90 cm  LV E/e' medial:  18.9 LV IVS:        0.90 cm  LV e' lateral:   8.27 cm/s LVOT diam:     1.80 cm  LV E/e' lateral: 13.7 LV SV:         69 LV SV Index:   42 LVOT Area:     2.54 cm  RIGHT VENTRICLE RV Basal diam:  2.60 cm RV S prime:     14.70 cm/s TAPSE (M-mode): 1.9 cm LEFT ATRIUM             Index       RIGHT ATRIUM          Index LA diam:        3.40 cm 2.09 cm/m  RA Area:     9.61 cm LA Vol (A2C):   28.0 ml 17.25 ml/m RA Volume:   19.00 ml 11.70 ml/m LA Vol (A4C):   28.0 ml 17.25 ml/m LA Biplane Vol: 29.1 ml 17.92 ml/m  AORTIC VALVE AV Area (Vmax):    1.66 cm AV Area (Vmean):   1.60 cm AV Area (VTI):     1.61 cm AV Vmax:           168.50 cm/s AV Vmean:          125.000 cm/s AV VTI:            0.426 m AV Peak Grad:      11.4 mmHg AV Mean Grad:      7.0 mmHg LVOT Vmax:         110.00 cm/s LVOT Vmean:        78.600 cm/s LVOT VTI:          0.270 m LVOT/AV VTI ratio: 0.63  AORTA Ao Root diam: 2.40 cm MITRAL VALVE                TRICUSPID VALVE MV Area (PHT): 5.16  cm     TR Peak grad:   34.8 mmHg MV Decel Time: 147 msec     TR Vmax:        295.00 cm/s MV E velocity: 113.00 cm/s MV A velocity: 119.00 cm/s  SHUNTS MV E/A ratio:  0.95         Systemic VTI:  0.27 m  Systemic Diam: 1.80 cm Cherlynn Kaiser MD Electronically signed by Cherlynn Kaiser MD Signature Date/Time: 02/06/2021/5:19:59 PM    Final (Updated)      Subjective: Wants to go home , no complaints.   Discharge Exam: Vitals:   02/08/21 0450 02/08/21 0916  BP: (!) 116/39 (!) 126/44  Pulse: 69 74  Resp: 17 16  Temp: 98.3 F (36.8 C) 98.4 F (36.9 C)  SpO2: 99% 100%   Vitals:   02/07/21 1842 02/07/21 2145 02/08/21 0450 02/08/21 0916  BP: (!) 146/45 139/61 (!) 116/39 (!) 126/44  Pulse: 79 72 69 74  Resp: _0 Temp: 98.4 F (36.9 C) 98.3 F (36.8 C) 98.3 F (36.8 C) 98.4 F (36.9 C)  TempSrc:  Oral  Oral  SpO2: 97% 99% 99% 100%  Weight:  61.2 kg    Height:        General: Pt is alert, awake, not in acute distress Cardiovascular: RRR, S1/S2 +, no rubs, no gallops Respiratory: CTA bilaterally, no wheezing, no rhonchi Abdominal: Soft, NT, ND, bowel sounds + Extremities: no edema, no cyanosis    The results of significant diagnostics from this hospitalization (including imaging, microbiology, ancillary and laboratory) are listed below for reference.     Microbiology: No results found for this or any previous visit (from the past 240 hour(s)).   Labs: BNP (last 3 results) No results for input(s): BNP in the last 8760 hours. Basic Metabolic Panel: Recent Labs  Lab 02/04/21 0222 02/05/21 0319 02/06/21 0404 02/06/21 0409 02/07/21 0329 02/08/21 0438  NA 131* 133*  --  132* 136 137  K 3.7 3.7  --  3.6 3.6 3.6  CL 100 102  --  104 107 106  CO2 21* 21*  --  21* 21* 22  GLUCOSE 137* 111*  --  107* 102* 179*  BUN 54* 54*  --  55* 53* 54*  CREATININE 4.37* 4.59*  --  4.85* 5.06* 5.03*  CALCIUM 7.9* 7.8*  --  7.6* 7.9* 8.1*  MG 1.6* 1.7  1.7  --  1.7 1.6*  PHOS 3.7 4.2  4.2  --  4.4 4.7* 5.0*   Liver Function Tests: Recent Labs  Lab 02/04/21 0222 02/05/21 0319 02/06/21 0409 02/07/21 0329 02/08/21 0438  ALBUMIN 1.5* 1.6* 1.7* 2.8* 2.3*   No results for input(s): LIPASE, AMYLASE in the last 168 hours. No results for input(s): AMMONIA in the last 168 hours. CBC: Recent Labs  Lab 02/04/21 0222 02/05/21 0319 02/06/21 0404 02/07/21 0329 02/08/21 0438  WBC 14.0* 10.1 9.7 9.6 9.5  NEUTROABS 9.4* 6.3 6.6 6.5 6.5  HGB 9.4* 8.3* 7.8* 7.5* 7.3*  HCT 28.6* 26.4* 25.3* 23.8* 24.1*  MCV 95.7 100.0 99.6 100.0 102.1*  PLT 164 193 229 254 286   Cardiac Enzymes: No results for input(s): CKTOTAL, CKMB, CKMBINDEX, TROPONINI in the last 168 hours. BNP: Invalid input(s): POCBNP CBG: Recent Labs  Lab 02/07/21 1109 02/07/21 1643 02/07/21 2118 02/08/21 0636 02/08/21 1229  GLUCAP 170* 226* 244* 179* 328*   D-Dimer No results for input(s): DDIMER in the last 72 hours. Hgb A1c No results for input(s): HGBA1C in the last 72 hours. Lipid Profile No results for input(s): CHOL, HDL, LDLCALC, TRIG, CHOLHDL, LDLDIRECT in the last 72 hours. Thyroid function studies No results for input(s): TSH, T4TOTAL, T3FREE, THYROIDAB in the last 72 hours.  Invalid input(s): FREET3 Anemia work up No results for input(s): VITAMINB12, FOLATE, FERRITIN, TIBC, IRON, RETICCTPCT in the last 72 hours. Urinalysis  Component Value Date/Time   COLORURINE STRAW (A) 01/31/2021 1622   APPEARANCEUR CLEAR 01/31/2021 1622   LABSPEC 1.008 01/31/2021 1622   PHURINE 5.0 01/31/2021 1622   GLUCOSEU NEGATIVE 01/31/2021 1622   HGBUR MODERATE (A) 01/31/2021 1622   BILIRUBINUR NEGATIVE 01/31/2021 1622   KETONESUR NEGATIVE 01/31/2021 1622   PROTEINUR 30 (A) 01/31/2021 1622   NITRITE NEGATIVE 01/31/2021 1622   LEUKOCYTESUR NEGATIVE 01/31/2021 1622   Sepsis Labs Invalid input(s): PROCALCITONIN,  WBC,  LACTICIDVEN Microbiology No results found for  this or any previous visit (from the past 240 hour(s)).   Time coordinating discharge: 38 minutes.   SIGNED:   Hosie Poisson, MD  [facility] 02/08/2021, 1:32 PM

## 2021-02-08 NOTE — Progress Notes (Signed)
Nutrition Brief Note  Chart reviewed. Pt now transitioning to comfort care.  No further nutrition interventions planned at this time.  Please re-consult as needed.   Agueda Houpt, MS, RD, LDN (she/her/hers) RD pager number and weekend/on-call pager number located in Amion.   

## 2021-02-08 NOTE — TOC Transition Note (Addendum)
Transition of Care [facility]) - CM/SW Discharge Note   Patient Details  Name: KIJANA ESTOCK MRN: [id number] Date of Birth: 02/21/1951  [facility]) CM/SW Contact:  Epifanio Lesches, RN Phone Number: 02/08/2021, 3:08 PM   Clinical Narrative:    Patient will DC to: home Anticipated DC date: 02/08/2021 Family notified: husband Transport by: [facility]  Admitted with Acute metabolic encephalopathy / AKI. Per MD patient ready for DC today . RN, patient, patient's family, and [facility] / Delice Bison  [phone] of DC. Transportation forms on chart. Ambulance transport requested for patient.   RNCM will sign off for now as intervention is no longer needed. Please consult [place] again if new needs arise.    Final next level of care: Home w Hospice Care Barriers to Discharge: No Barriers Identified   Patient Goals and CMS Choice Patient states their goals for this hospitalization and ongoing recovery are:: return home with spouse CMS Medicare.gov Compare Post Acute Care list provided to:: Patient Choice offered to / list presented to : Patient, Spouse  Discharge Placement                       Discharge Plan and Services In-house Referral: Clinical Social Work Discharge Planning Services: CM Consult Post Acute Care Choice: Home Health          DME Arranged: N/A DME Agency: NA       HH Arranged: RN, PT HH Agency: [facility] Date [facility] Agency Contacted: 01/28/21 Time HH Agency Contacted: 1045 Representative spoke with at [facility] Agency: Marcelino Duster  Social Determinants of Health (SDOH) Interventions     Readmission Risk Interventions No flowsheet data found.

## 2021-05-24 ENCOUNTER — Telehealth: Payer: Self-pay | Admitting: Oncology

## 2021-05-24 NOTE — Telephone Encounter (Signed)
Patient's spouse stated that Dr Kerry Dory referred patient for Elevated WBC.  We have not received the Referral and he is out-of-office from 10/14 to 10/23.  Left Message for office to return my call  Patient scheduled for 05/27/21 Labs 2:30 pm - Consult 3:00 pm

## 2021-05-26 ENCOUNTER — Other Ambulatory Visit: Payer: Self-pay | Admitting: Oncology

## 2021-05-26 DIAGNOSIS — D72829 Elevated white blood cell count, unspecified: Secondary | ICD-10-CM

## 2021-05-27 ENCOUNTER — Encounter: Payer: Self-pay | Admitting: Oncology

## 2021-05-27 ENCOUNTER — Inpatient Hospital Stay: Payer: Medicare Other | Attending: Oncology | Admitting: Oncology

## 2021-05-27 ENCOUNTER — Inpatient Hospital Stay: Payer: Medicare Other

## 2021-05-27 DIAGNOSIS — D72828 Other elevated white blood cell count: Secondary | ICD-10-CM | POA: Diagnosis not present

## 2021-05-27 DIAGNOSIS — D72829 Elevated white blood cell count, unspecified: Secondary | ICD-10-CM | POA: Insufficient documentation

## 2021-05-27 NOTE — Progress Notes (Signed)
[facility]  [address] [place],  [place]  [address] [phone]  Clinic Day:  05/27/2021  Referring physician: Lysbeth Penner, MD   HISTORY OF PRESENT ILLNESS:  The patient is a 70 y.o. female who I was asked to consult upon for leukocytosis.  Labs in early October 2022 showed an elevated white count of 19.5.  Her white count differential included 82% neutrophils, 8% lymphocytes, and 9% monocytes.  The patient brings to my attention that she has been taking Decadron twice daily for the past 6+ weeks.  This was started for pain control while she was under hospice care.  However, she recently took herself off hospice.  She denies having any recent infections or fevers.  She denies having any B symptoms which concern her for an underlying hematologic malignancy being behind her leukocytosis.  She has not smoked in over 37 years.  She denies having undergone a splenectomy.    PAST MEDICAL HISTORY:   Past Medical History:  Diagnosis Date   Chronic kidney disease    Diabetes mellitus without complication (HCC)    Hypertension    Metabolic acidosis, normal anion gap (NAG) 01/25/2021   Thyroid disease     PAST SURGICAL HISTORY:   Past Surgical History:  Procedure Laterality Date   BACK SURGERY     X2   CESAREAN SECTION     EYE SURGERY     TUBAL LIGATION     WRIST SURGERY Left     CURRENT MEDICATIONS:   Current Outpatient Medications  Medication Sig Dispense Refill   amLODipine (NORVASC) 5 MG tablet Take 5 mg by mouth daily.     BAQSIMI ONE PACK 3 MG/DOSE POWD Place 1 spray into the nose See admin instructions. INHALE 1 SPRAY INTO SINGLE NOSTRIL. IF NO RESPONSE, MAY REPEAT IN 15 MINUTES USING A NEW DEVICE. USE AS NEEDED FOR LOW BLOOD SUGAR REACTION.     Calcium Carb-Cholecalciferol 600-200 MG-UNIT TABS Take 1 tablet by mouth daily.     carvedilol (COREG) 25 MG tablet Take 25 mg by mouth 2 (two) times daily.     diclofenac Sodium (VOLTAREN) 1  % GEL Apply 1 g topically as needed for pain.     fexofenadine (ALLEGRA) 180 MG tablet Take 180 mg by mouth as needed for allergies.     furosemide (LASIX) 40 MG tablet Take 1 tablet (40 mg total) by mouth daily. 30 tablet 0   gabapentin (NEURONTIN) 300 MG capsule Take 300 mg by mouth 3 (three) times daily.     Glucagon, rDNA, (GLUCAGON EMERGENCY) 1 MG KIT Inject 1 mg into the skin as directed.     guanFACINE (TENEX) 1 MG tablet Take 1 mg by mouth at bedtime.     insulin lispro (HUMALOG) 100 UNIT/ML injection Inject 40 Units into the skin continuous. Via pump.     labetalol (NORMODYNE) 100 MG tablet Take 100 mg by mouth daily as needed (TAKE ONE TABLET EVERY DAY ONLY AS NEEDED FOR SYSTOLIC BP >562).     omeprazole (PRILOSEC) 20 MG capsule Take 20 mg by mouth as needed (acid reflux).     ondansetron (ZOFRAN) 4 MG tablet Take 4 mg by mouth every 6 (six) hours as needed for nausea/vomiting.     SYNTHROID 100 MCG tablet Take 100 mcg by mouth every morning.     traMADol (ULTRAM) 50 MG tablet Take 50 mg by mouth as needed for pain.     No current facility-administered  medications for this visit.    ALLERGIES:   Allergies  Allergen Reactions   Ace Inhibitors Cough   Diazepam Anxiety   Metoclopramide Other (See Comments)    Can not function   Naproxen Swelling   Nitrofurantoin Shortness Of Breath and Other (See Comments)    Hallucination   Olmesartan Other (See Comments)    Hyperkalemia; Ran up her potassium     Alendronate Sodium Other (See Comments)    acid reflux   Erythromycin Base Other (See Comments)   Hydrochlorothiazide Other (See Comments)    Was hurting in her chest and throwing up   Povidone-Iodine Itching    FAMILY HISTORY:   Family History  Problem Relation Age of Onset   Skin cancer Mother    Lung cancer Father    Diabetes Sister    Diabetes Brother    Lung cancer Maternal Grandfather     SOCIAL HISTORY:  The patient was born and raised in [place].  She  lives in the [place] with her husband of 70 years.  She has 1 child, 2 grandchildren, and 1 great-grandchildren.  She was a Marine scientist for 6 years.  She smoked half a pack of cigarettes daily for 8 years, but quit smoking 37 years ago.  She denies a history of alcohol use.    REVIEW OF SYSTEMS:  Review of Systems  Constitutional:  Positive for fatigue. Negative for fever.  HENT:   Negative for hearing loss and sore throat.   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  Respiratory:  Positive for shortness of breath. Negative for chest tightness, cough and hemoptysis.   Cardiovascular:  Negative for chest pain and palpitations.  Gastrointestinal:  Positive for nausea and vomiting. Negative for abdominal distention, abdominal pain, blood in stool, constipation and diarrhea.  Endocrine: Negative for hot flashes.  Genitourinary:  Negative for difficulty urinating, dysuria, frequency, hematuria and nocturia.   Musculoskeletal:  Positive for arthralgias. Negative for back pain, gait problem and myalgias.  Skin: Negative.  Negative for itching and rash.  Neurological: Negative.  Negative for dizziness, extremity weakness, gait problem, headaches, light-headedness and numbness.  Hematological: Negative.   Psychiatric/Behavioral: Negative.  Negative for depression and suicidal ideas. The patient is not nervous/anxious.     PHYSICAL EXAM:  Blood pressure 135/61, pulse 83, temperature 98.3 F (36.8 C), resp. rate 14, height $RemoveBe'4\' 10"'RHAUIJWUT$  (1.473 m), SpO2 91 %. Wt Readings from Last 3 Encounters:  02/07/21 134 lb 14.8 oz (61.2 kg)  09/06/19 107 lb (48.5 kg)   Body mass index is 28.2 kg/m. Performance status (ECOG): 3 - Symptomatic, >50% confined to bed Physical Exam Constitutional:      Appearance: Normal appearance. She is not ill-appearing.     Comments: Moon facies; chronically ill appearance; in a wheelchair  HENT:     Mouth/Throat:     Mouth: Mucous membranes are moist.      Pharynx: Oropharynx is clear. No oropharyngeal exudate or posterior oropharyngeal erythema.  Cardiovascular:     Rate and Rhythm: Normal rate and regular rhythm.     Heart sounds: No murmur heard.   No friction rub. No gallop.  Pulmonary:     Effort: Pulmonary effort is normal. No respiratory distress.     Breath sounds: Normal breath sounds. No wheezing, rhonchi or rales.  Chest:  Breasts:    Right: No swelling, bleeding, inverted nipple, mass, nipple discharge, skin change or tenderness.     Left: No swelling,  bleeding, inverted nipple, mass, nipple discharge, skin change or tenderness.  Abdominal:     General: Bowel sounds are normal. There is no distension.     Palpations: Abdomen is soft. There is no mass.     Tenderness: There is no abdominal tenderness.  Musculoskeletal:        General: No swelling.     Right lower leg: No edema.     Left lower leg: No edema.  Lymphadenopathy:     Cervical: No cervical adenopathy.     Upper Body:     Right upper body: No supraclavicular, axillary or pectoral adenopathy.     Left upper body: No supraclavicular, axillary or pectoral adenopathy.     Lower Body: No right inguinal adenopathy. No left inguinal adenopathy.  Skin:    General: Skin is warm.     Coloration: Skin is not jaundiced.     Findings: No lesion or rash.  Neurological:     General: No focal deficit present.     Mental Status: She is alert and oriented to person, place, and time. Mental status is at baseline.     Cranial Nerves: Cranial nerves are intact.  Psychiatric:        Mood and Affect: Mood normal.        Behavior: Behavior normal.        Thought Content: Thought content normal.   LABS:  Patient refused labs today.  Labs from 05-15-21: WBC 19.5 HGB 12.3 HCT 37.4 PLT 363  BUN 52 CREATININE 1.18 EGFR 50   CBC Latest Ref Rng & Units 02/08/2021 02/07/2021 02/06/2021  WBC 4.0 - 10.5 K/uL 9.5 9.6 9.7  Hemoglobin 12.0 - 15.0 g/dL 7.3(L) 7.5(L) 7.8(L)   Hematocrit 36.0 - 46.0 % 24.1(L) 23.8(L) 25.3(L)  Platelets 150 - 400 K/uL 286 254 229   CMP Latest Ref Rng & Units 02/08/2021 02/07/2021 02/06/2021  Glucose 70 - 99 mg/dL 179(H) 102(H) 107(H)  BUN 8 - 23 mg/dL 54(H) 53(H) 55(H)  Creatinine 0.44 - 1.00 mg/dL 5.03(H) 5.06(H) 4.85(H)  Sodium 135 - 145 mmol/L 137 136 132(L)  Potassium 3.5 - 5.1 mmol/L 3.6 3.6 3.6  Chloride 98 - 111 mmol/L 106 107 104  CO2 22 - 32 mmol/L 22 21(L) 21(L)  Calcium 8.9 - 10.3 mg/dL 8.1(L) 7.9(L) 7.6(L)  Total Protein 6.5 - 8.1 g/dL - - -  Total Bilirubin 0.3 - 1.2 mg/dL - - -  Alkaline Phos 38 - 126 U/L - - -  AST 15 - 41 U/L - - -  ALT 0 - 44 U/L - - -    ASSESSMENT & PLAN:  A 70 y.o. female who I was asked to consult upon for leukocytosis.  Her recently elevated white count of 19.5 can definitely be explained by her Decadron use over the past month and a half.  Steroids are notorious for causing white cells to migrate from vessel sidewalls into the active blood circulation, thus leading to leukocytosis.  Labs in July 2022 showed no evidence of leukocytosis, which reflects this was not an issue until after Decadron was started for pain relief.  As the patient is already on oxycodone and pain does not appear to be an issue, I really do not see the need for her to remain on Decadron.  I recommend that her Decadron be weaned off over the next 7-10 days, as guided by her primary care office.  As she has no other pressing hematologic issues, I do feel comfortable turning her  care back over to her primary care office.  The patient understands all the plans discussed today and is in agreement with them.  I do appreciate Lysbeth Penner, MD for his new consult.   Demetry Bendickson Macarthur Critchley, MD

## 2021-06-11 DEATH — deceased

## 2021-07-16 IMAGING — MG DIGITAL SCREENING BILAT W/ TOMO W/ CAD
6 of 10 series · 6 of 30 positions shown · non-contrast
Comparison: Previous exam(s).

CLINICAL DATA: Screening.

EXAM:
DIGITAL SCREENING BILATERAL MAMMOGRAM WITH TOMO AND CAD

[R CC synth-2D]
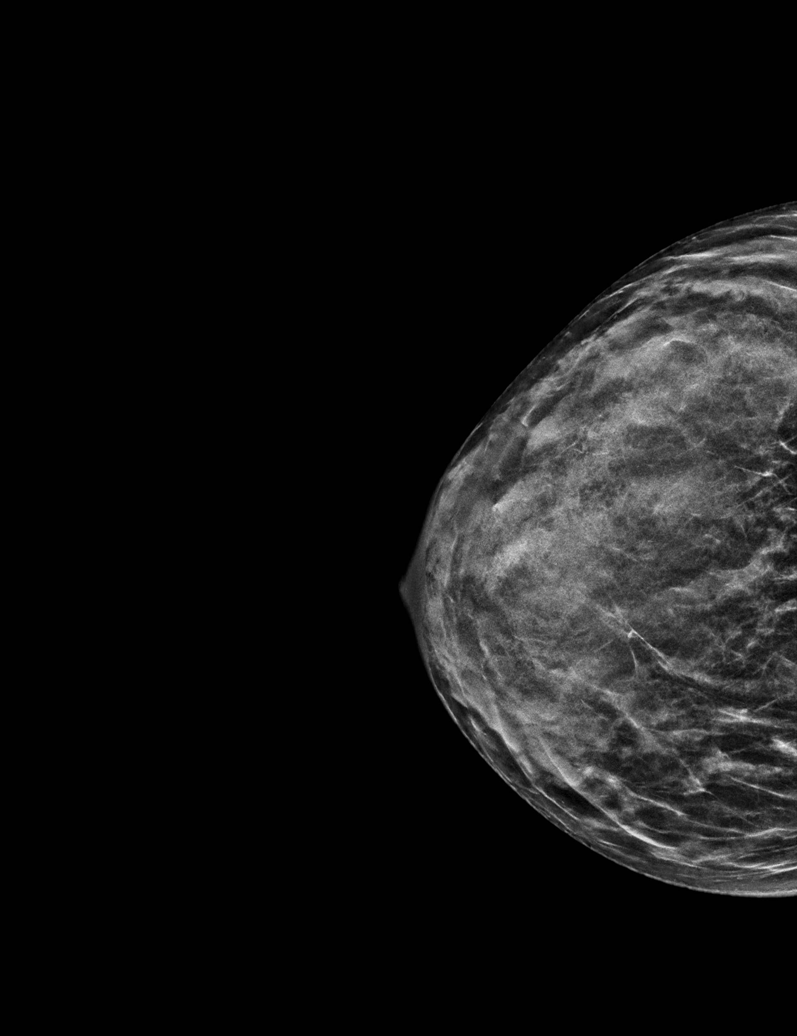

[L CC synth-2D]
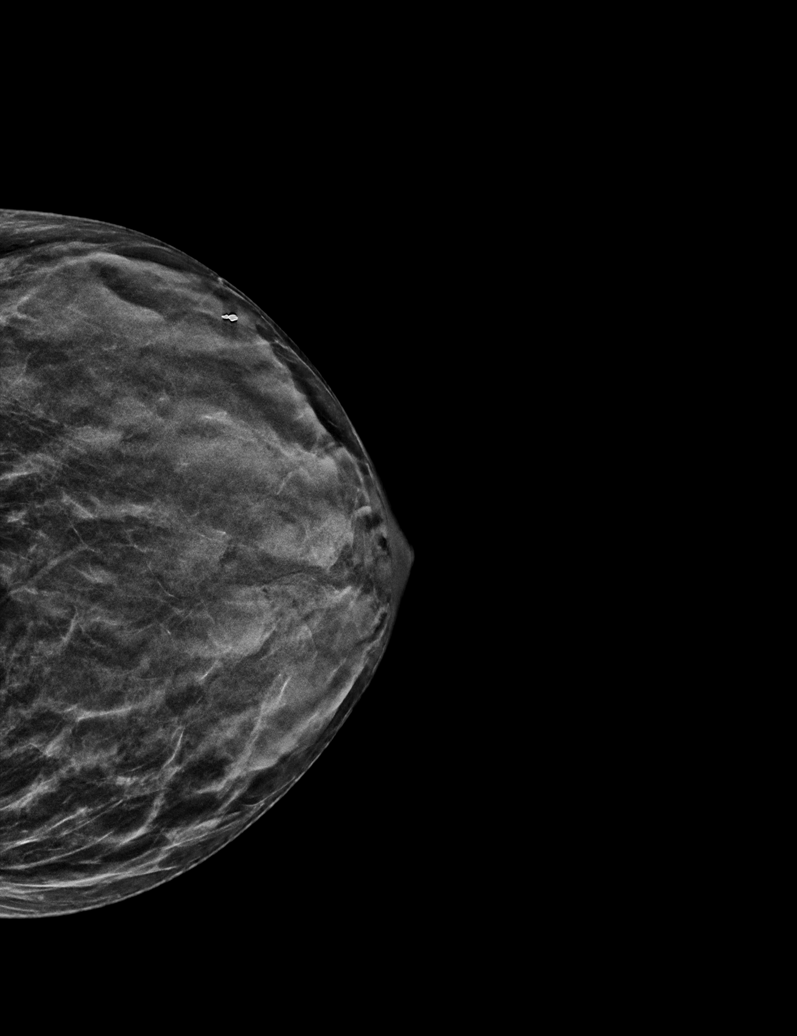

[L MLO synth-2D (1 of 2)]
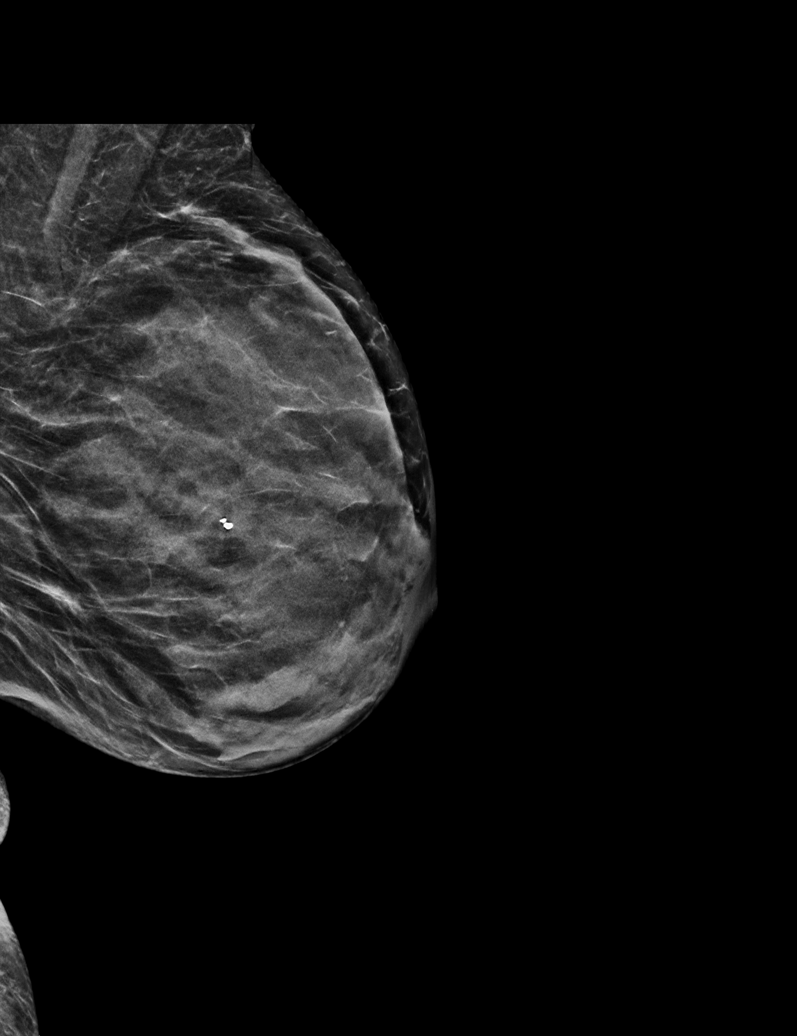

[L MLO synth-2D (2 of 2)]
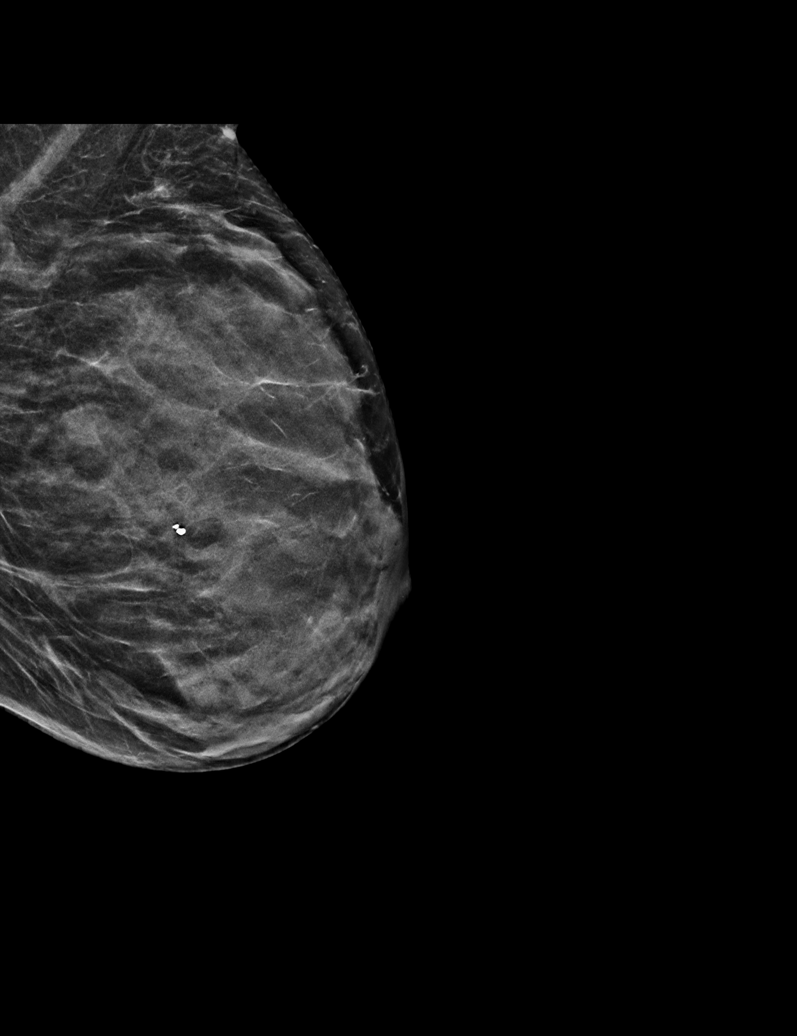

[R MLO synth-2D]
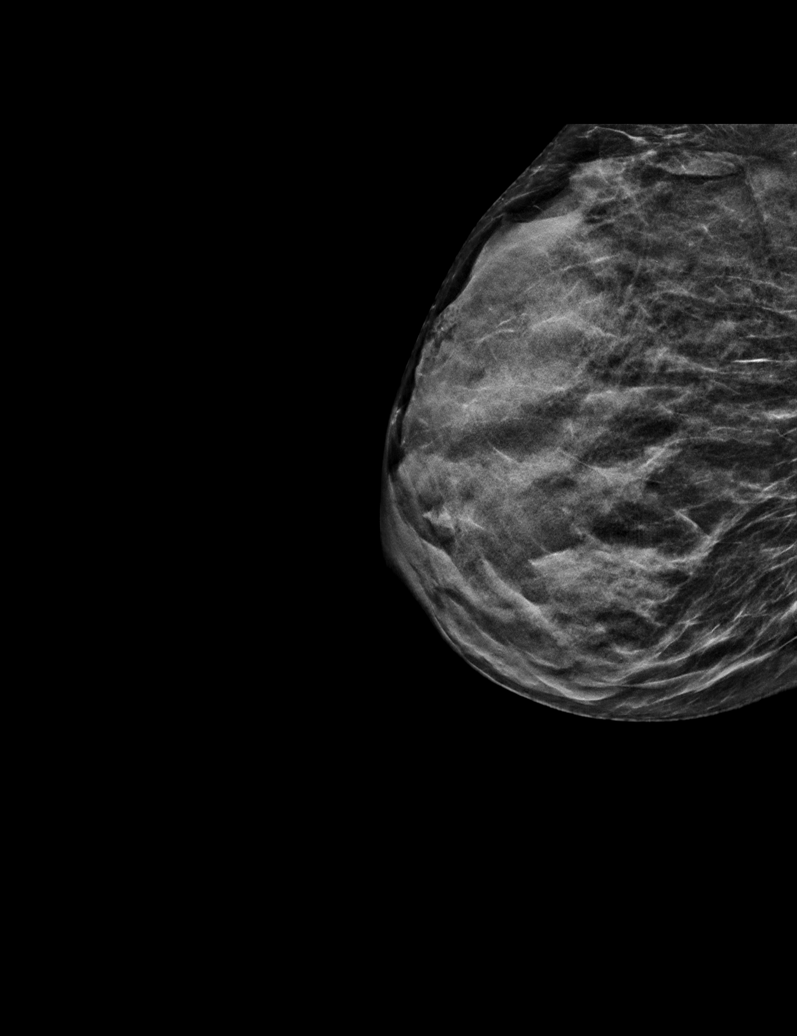

[L MLO tomo · tomo slice 23/44.0]
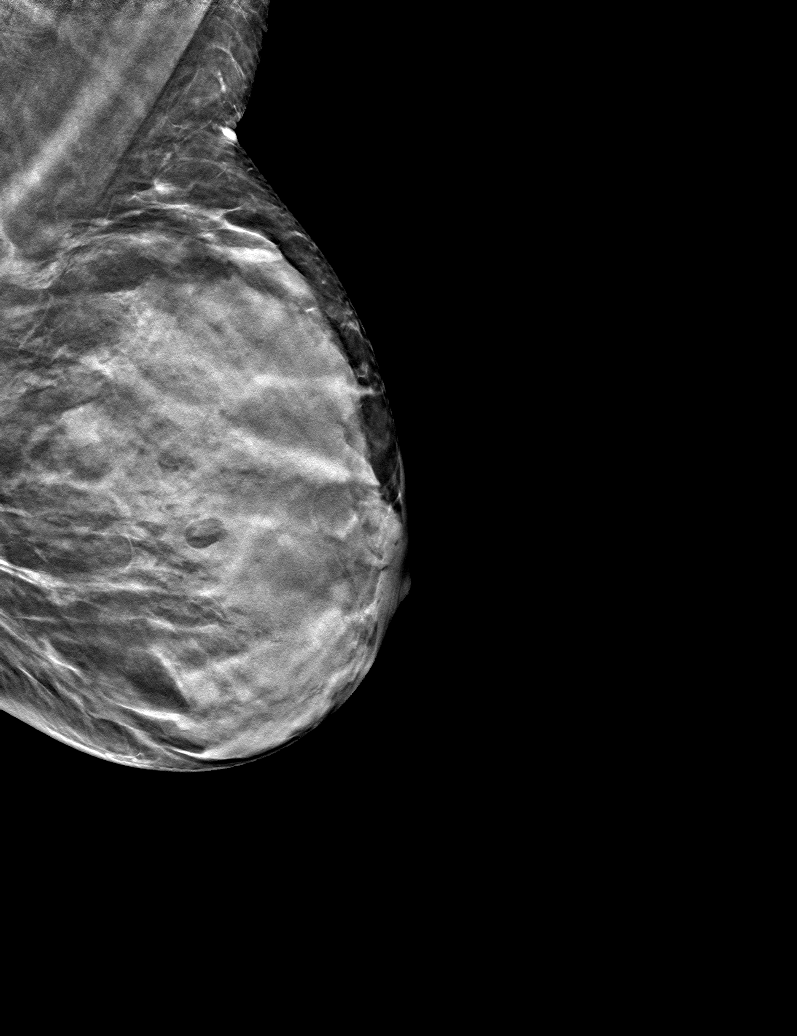

[6 of 30 positions shown; findings below may reference images not displayed]

ACR Breast Density Category d: The breast tissue is extremely dense,
which lowers the sensitivity of mammography
FINDINGS: There are no findings suspicious for malignancy. Images were
processed with CAD.
IMPRESSION: No mammographic evidence of malignancy. A result letter of this
screening mammogram will be mailed directly to the patient.

RECOMMENDATION:
Screening mammogram in one year. (Code:WO-0-ZI0)

BI-RADS CATEGORY  1: Negative.

## 2022-03-07 IMAGING — DX DG ANKLE COMPLETE 3+V*L*
3 series · 3 of 3 positions shown · non-contrast
Comparison: None.

CLINICAL DATA: Left ankle pain, unspecified chronicity

EXAM:
LEFT ANKLE COMPLETE - 3+ VIEW

[dg ankle complete left (1 of 3)]
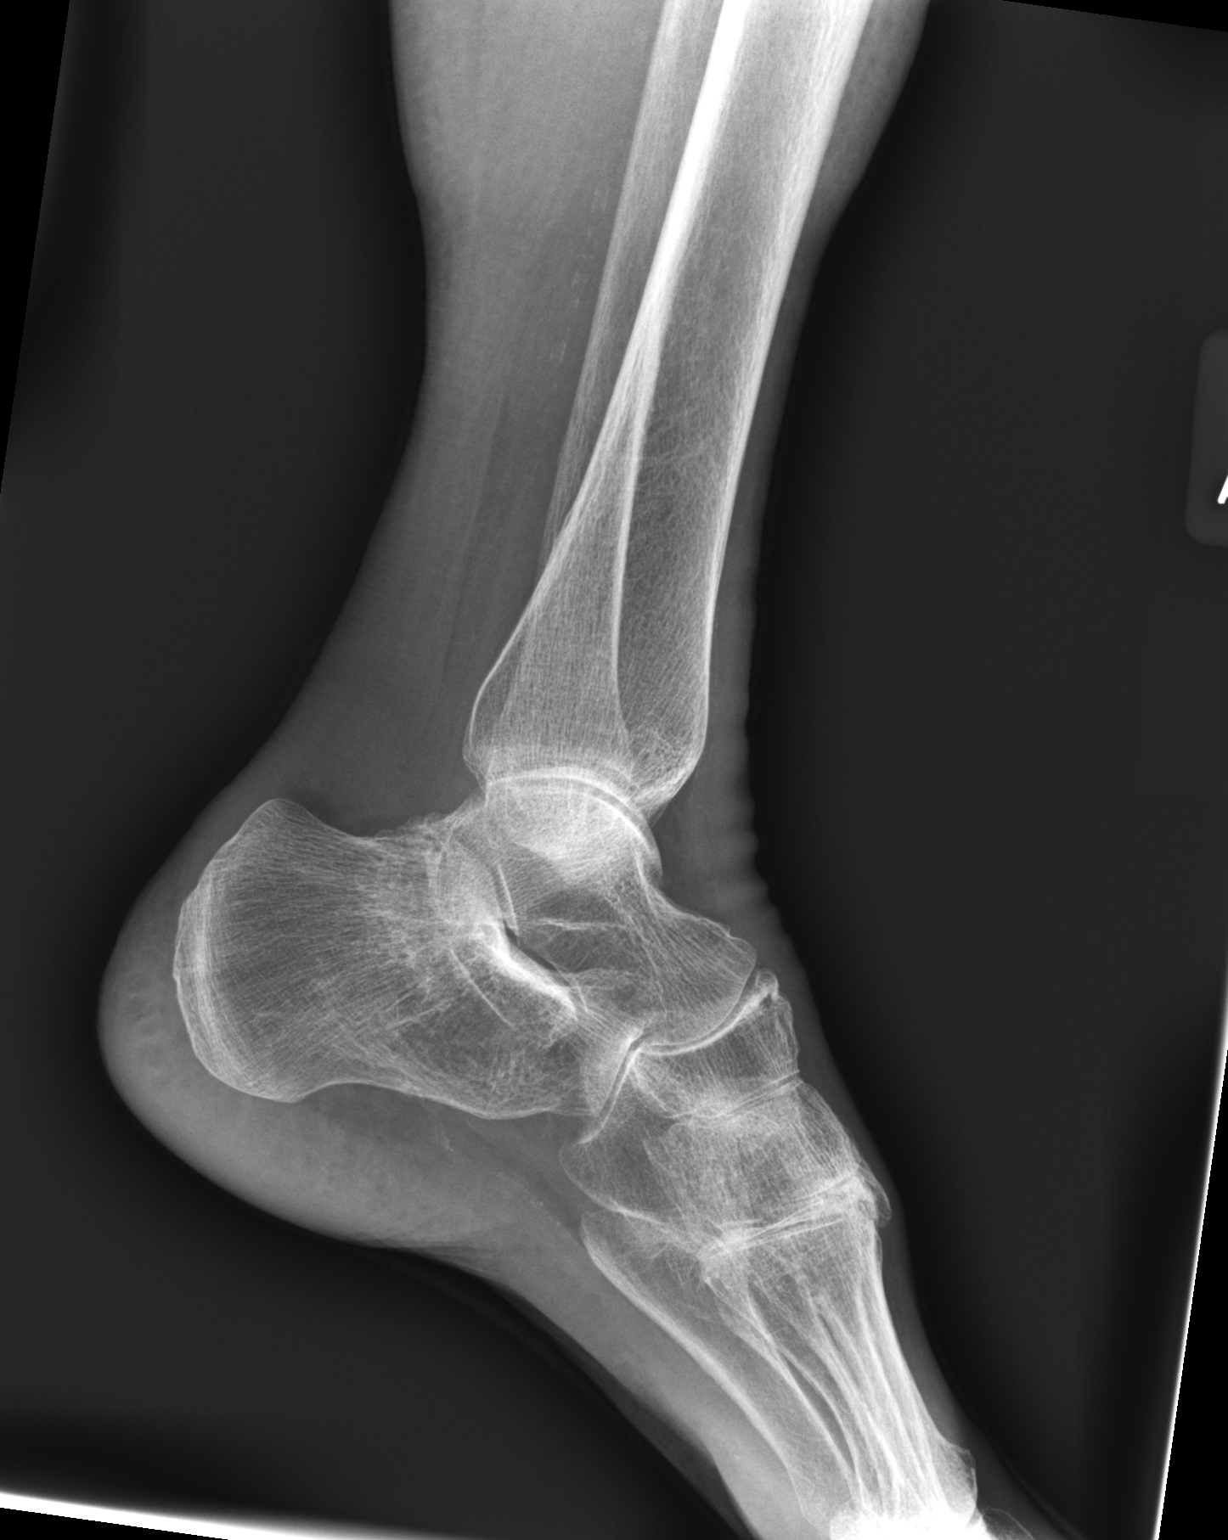

[dg ankle complete left (2 of 3)]
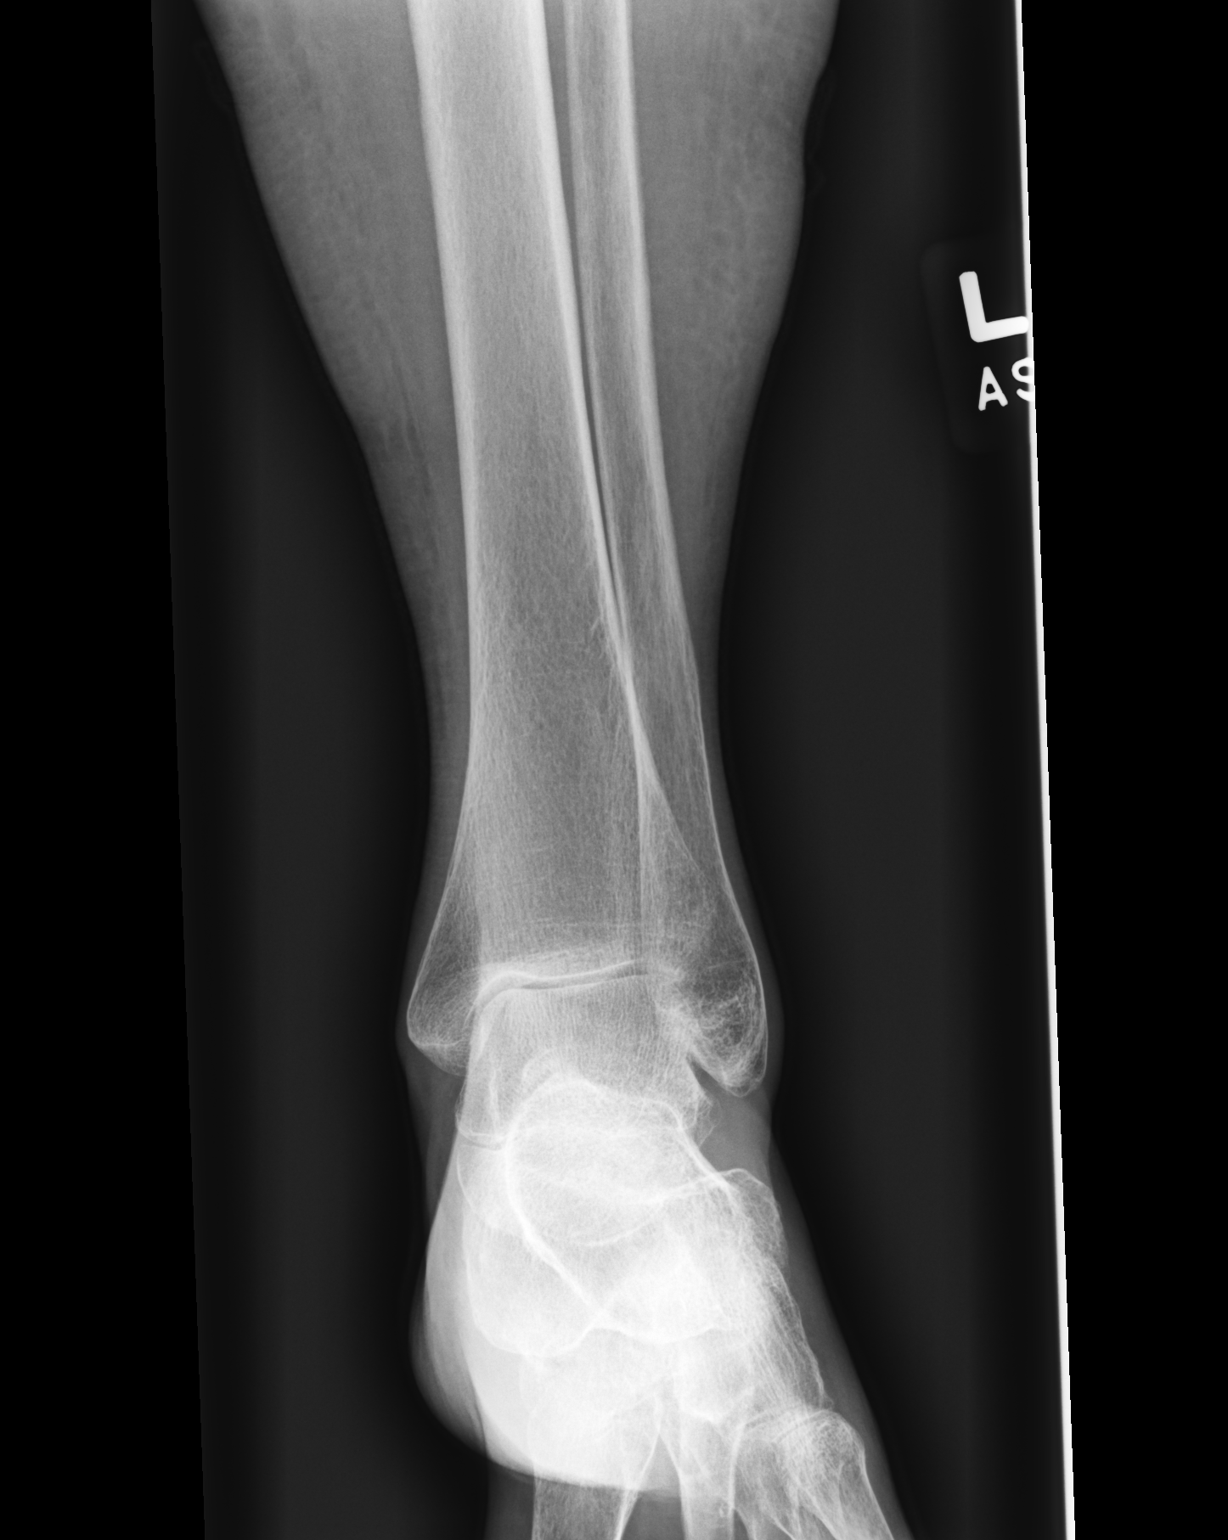

[dg ankle complete left (3 of 3)]
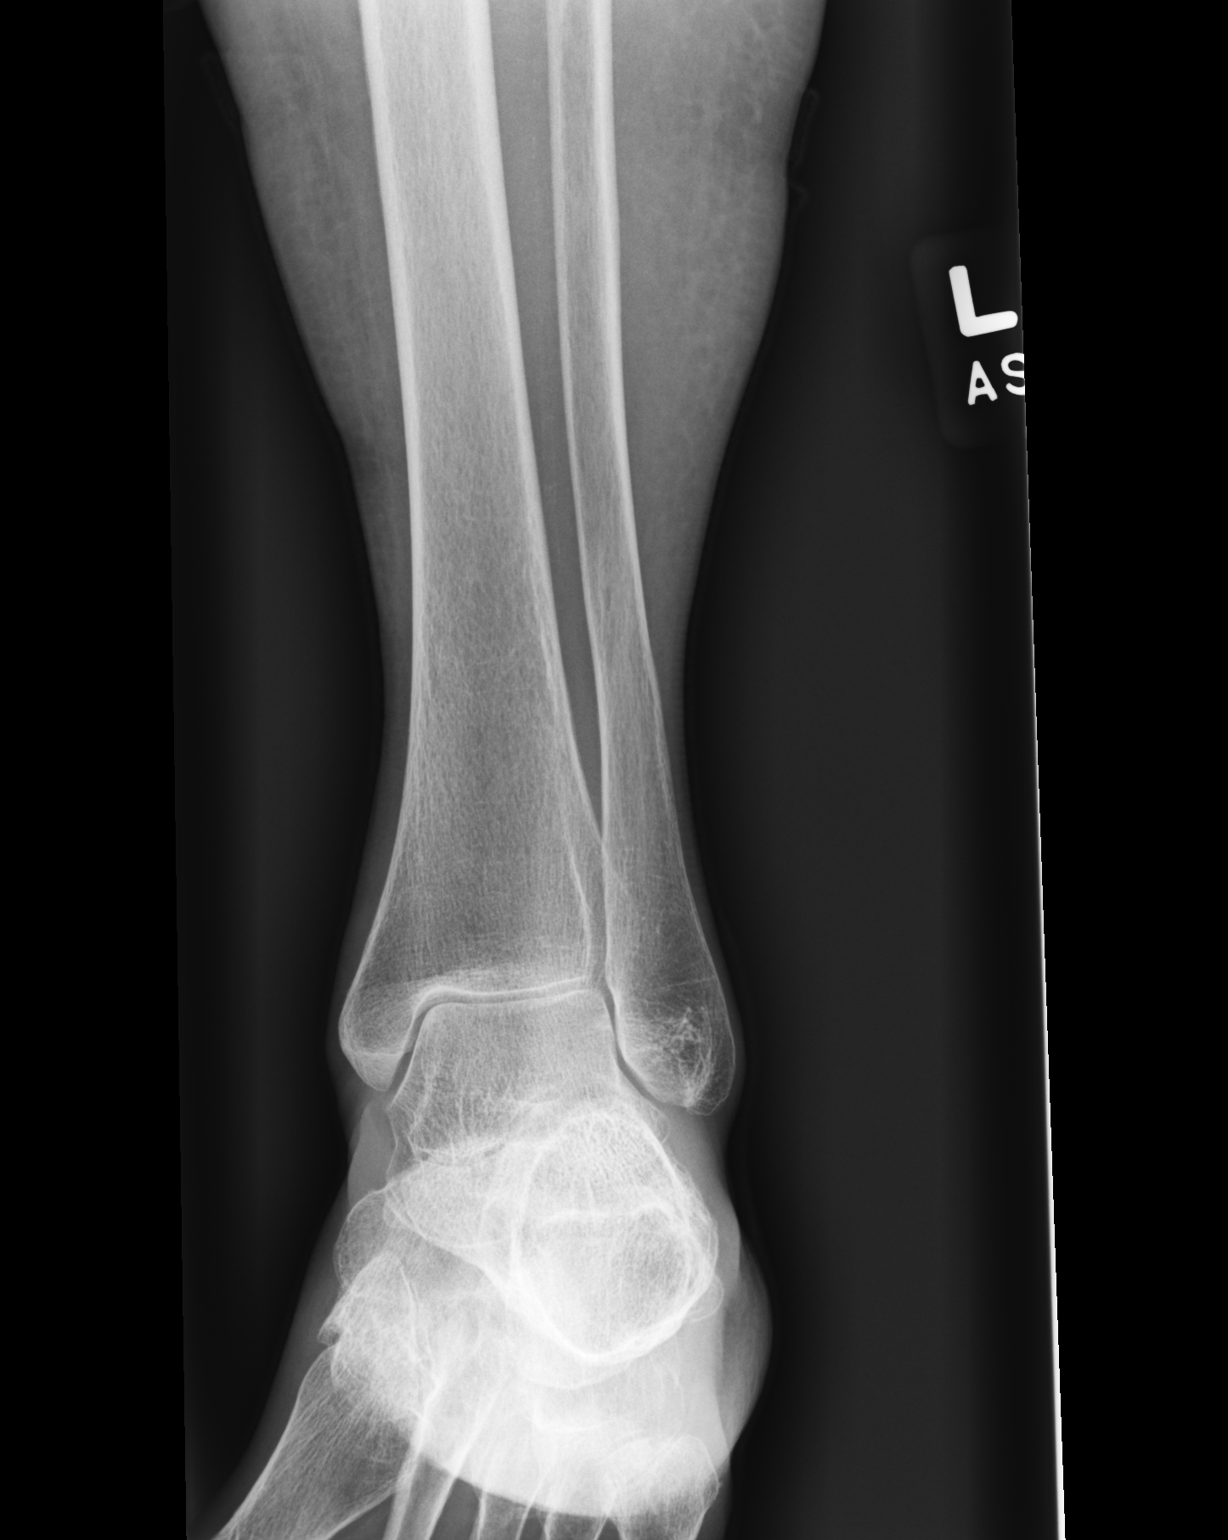

[3 of 3 positions shown; findings below may reference images not displayed]

FINDINGS: Normal alignment with approximation of the joints. Mild lower
extremity edema. No fracture or focal osseous lesion. Mild to
moderate joint space loss, subchondral sclerosis and osteophytosis
involving the midfoot, talocalcaneal and tibiotalar joints.
IMPRESSION: No acute osseous abnormality.  Mild lower extremity edema.

Mild to moderate osteoarthrosis.

## 2022-07-08 IMAGING — DX DG CHEST 1V PORT
1 series · 1 of 1 positions shown · non-contrast
Comparison: CT and radiograph 01/24/2021

CLINICAL DATA: Central line placement

EXAM:
PORTABLE CHEST 1 VIEW

[chest]
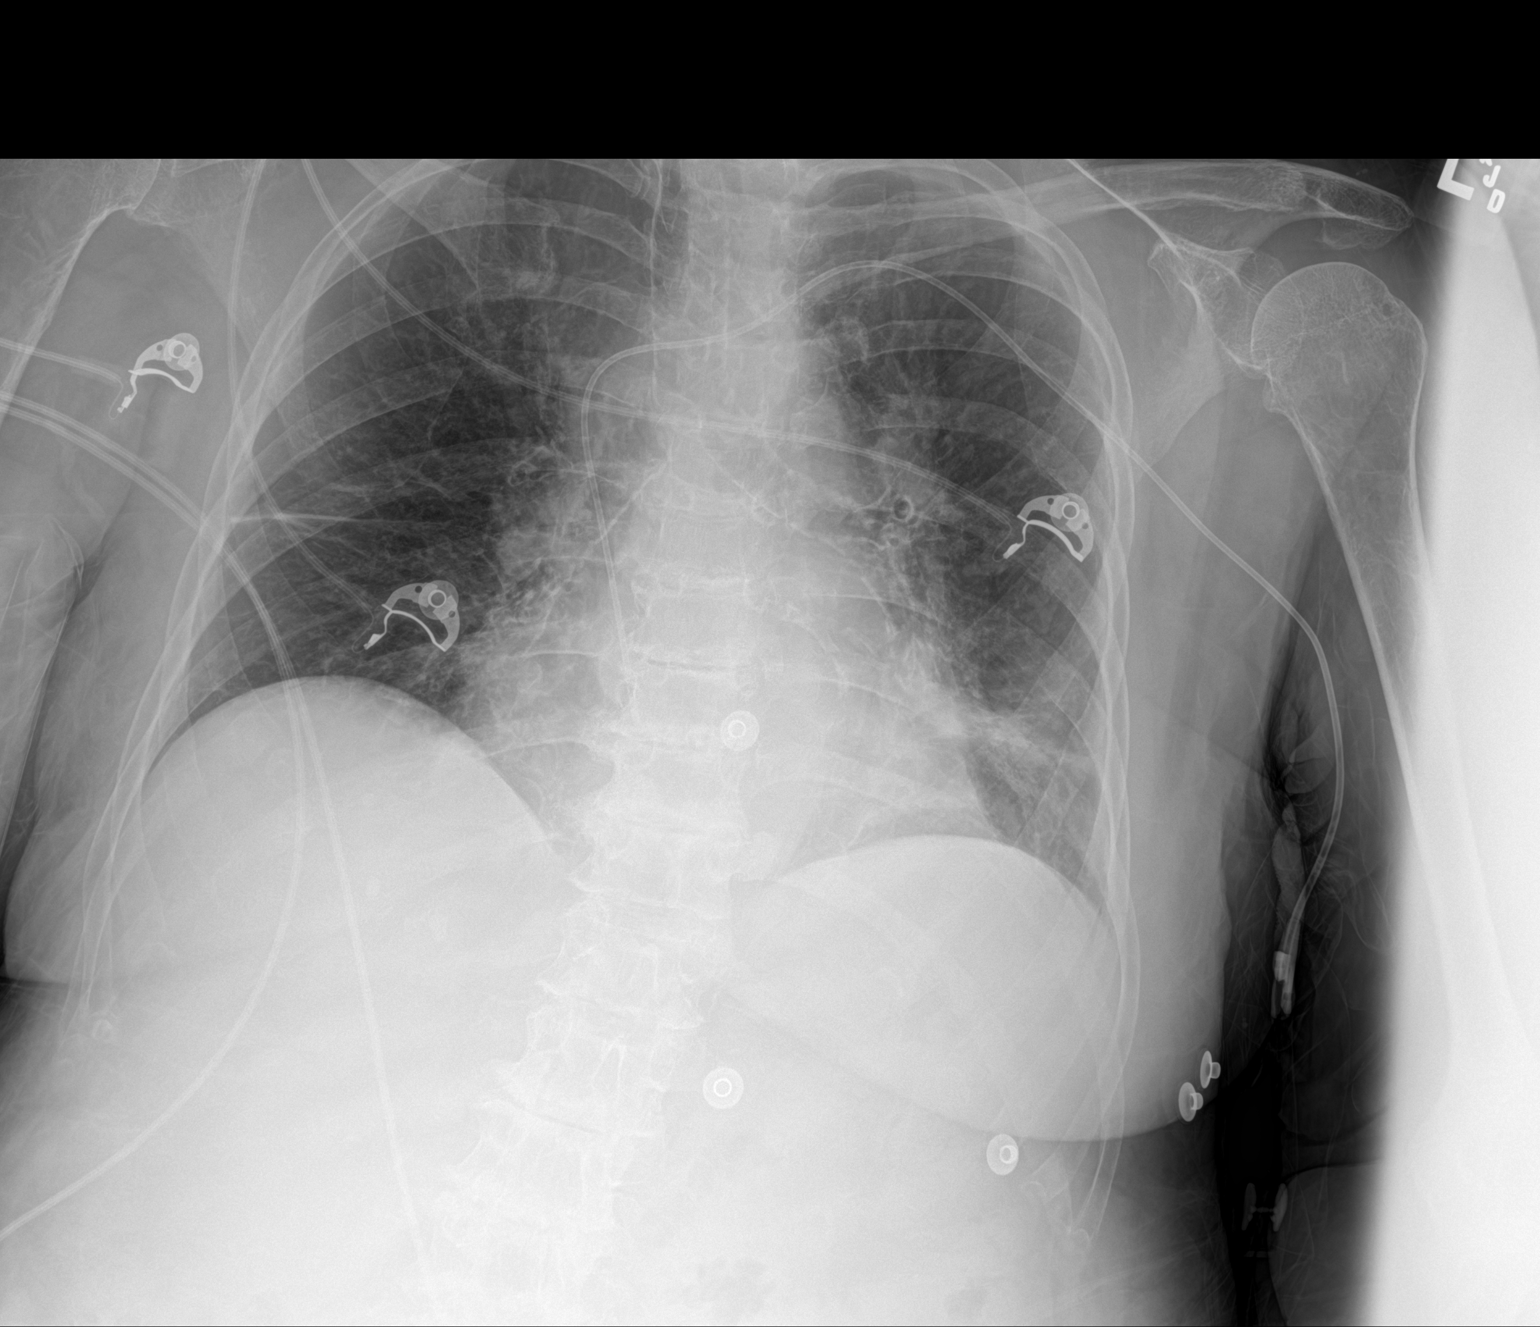

[1 of 1 positions shown; findings below may reference images not displayed]

FINDINGS: Left upper extremity PICC tip terminates at the level of the right
atrium. No pneumothorax or effusion. Atelectatic changes in both
lungs including more bandlike density in the left lower lung. No
pneumothorax. No effusion. Stable cardiomediastinal contours
accounting for differences technique. Degenerative changes are
present in the imaged spine and shoulders. No acute osseous or soft
tissue abnormality.
IMPRESSION: Left upper extremity PICC tip terminates at the right atrium.

Persistent bandlike opacities, likely subsegmental atelectasis
and/or scarring.
